# Patient Record
Sex: Female | Born: 1950 | Race: White | Hispanic: No | Marital: Married | Smoking: Never smoker
Health system: Southern US, Community
[De-identification: ages and names within clinical notes are randomized; demographics above are authoritative.]

## PROBLEM LIST (undated history)

## (undated) DIAGNOSIS — R112 Nausea with vomiting, unspecified: Secondary | ICD-10-CM

## (undated) DIAGNOSIS — K5730 Diverticulosis of large intestine without perforation or abscess without bleeding: Secondary | ICD-10-CM

## (undated) DIAGNOSIS — K5792 Diverticulitis of intestine, part unspecified, without perforation or abscess without bleeding: Secondary | ICD-10-CM

## (undated) DIAGNOSIS — Z860100 Personal history of colon polyps, unspecified: Secondary | ICD-10-CM

## (undated) DIAGNOSIS — F419 Anxiety disorder, unspecified: Secondary | ICD-10-CM

## (undated) DIAGNOSIS — Z9889 Other specified postprocedural states: Secondary | ICD-10-CM

## (undated) DIAGNOSIS — R42 Dizziness and giddiness: Secondary | ICD-10-CM

## (undated) DIAGNOSIS — K648 Other hemorrhoids: Secondary | ICD-10-CM

## (undated) DIAGNOSIS — E785 Hyperlipidemia, unspecified: Secondary | ICD-10-CM

## (undated) DIAGNOSIS — K759 Inflammatory liver disease, unspecified: Secondary | ICD-10-CM

## (undated) DIAGNOSIS — I10 Essential (primary) hypertension: Secondary | ICD-10-CM

## (undated) DIAGNOSIS — M1990 Unspecified osteoarthritis, unspecified site: Secondary | ICD-10-CM

## (undated) DIAGNOSIS — K219 Gastro-esophageal reflux disease without esophagitis: Secondary | ICD-10-CM

## (undated) HISTORY — DX: Hyperlipidemia, unspecified: E78.5

## (undated) HISTORY — DX: Personal history of colonic polyps: Z86.010

## (undated) HISTORY — DX: Diverticulitis of intestine, part unspecified, without perforation or abscess without bleeding: K57.92

## (undated) HISTORY — DX: Gastro-esophageal reflux disease without esophagitis: K21.9

## (undated) HISTORY — DX: Other hemorrhoids: K64.8

## (undated) HISTORY — PX: ELBOW FRACTURE SURGERY: SHX616

## (undated) HISTORY — DX: Essential (primary) hypertension: I10

## (undated) HISTORY — PX: TUBAL LIGATION: SHX77

## (undated) HISTORY — PX: HERNIA REPAIR: SHX51

## (undated) HISTORY — DX: Dizziness and giddiness: R42

## (undated) HISTORY — DX: Personal history of colon polyps, unspecified: Z86.0100

## (undated) HISTORY — PX: BREAST BIOPSY: SHX20

## (undated) HISTORY — DX: Diverticulosis of large intestine without perforation or abscess without bleeding: K57.30

## (undated) HISTORY — PX: HIP SURGERY: SHX245

---

## 2002-11-29 ENCOUNTER — Other Ambulatory Visit: Admission: RE | Admit: 2002-11-29 | Discharge: 2002-11-29 | Payer: Self-pay | Admitting: Obstetrics and Gynecology

## 2003-12-05 ENCOUNTER — Other Ambulatory Visit: Admission: RE | Admit: 2003-12-05 | Discharge: 2003-12-05 | Payer: Self-pay | Admitting: Obstetrics and Gynecology

## 2004-10-06 LAB — HM COLONOSCOPY

## 2004-10-13 ENCOUNTER — Ambulatory Visit: Payer: Self-pay | Admitting: Internal Medicine

## 2004-11-04 ENCOUNTER — Ambulatory Visit: Payer: Self-pay | Admitting: Internal Medicine

## 2004-11-04 ENCOUNTER — Encounter: Payer: Self-pay | Admitting: Internal Medicine

## 2004-11-04 DIAGNOSIS — K5730 Diverticulosis of large intestine without perforation or abscess without bleeding: Secondary | ICD-10-CM | POA: Insufficient documentation

## 2004-11-04 HISTORY — DX: Diverticulosis of large intestine without perforation or abscess without bleeding: K57.30

## 2004-12-10 ENCOUNTER — Other Ambulatory Visit: Admission: RE | Admit: 2004-12-10 | Discharge: 2004-12-10 | Payer: Self-pay | Admitting: Obstetrics and Gynecology

## 2004-12-20 ENCOUNTER — Ambulatory Visit (HOSPITAL_COMMUNITY): Admission: RE | Admit: 2004-12-20 | Discharge: 2004-12-20 | Payer: Self-pay | Admitting: General Surgery

## 2004-12-20 ENCOUNTER — Ambulatory Visit (HOSPITAL_BASED_OUTPATIENT_CLINIC_OR_DEPARTMENT_OTHER): Admission: RE | Admit: 2004-12-20 | Discharge: 2004-12-20 | Payer: Self-pay | Admitting: General Surgery

## 2004-12-20 ENCOUNTER — Encounter: Payer: Self-pay | Admitting: Internal Medicine

## 2004-12-20 ENCOUNTER — Encounter (INDEPENDENT_AMBULATORY_CARE_PROVIDER_SITE_OTHER): Payer: Self-pay | Admitting: Specialist

## 2005-01-11 ENCOUNTER — Ambulatory Visit (HOSPITAL_COMMUNITY): Admission: RE | Admit: 2005-01-11 | Discharge: 2005-01-11 | Payer: Self-pay | Admitting: Obstetrics and Gynecology

## 2005-03-31 ENCOUNTER — Ambulatory Visit: Payer: Self-pay | Admitting: Internal Medicine

## 2005-03-31 ENCOUNTER — Encounter: Payer: Self-pay | Admitting: Internal Medicine

## 2005-04-08 ENCOUNTER — Encounter: Payer: Self-pay | Admitting: Internal Medicine

## 2005-04-18 ENCOUNTER — Ambulatory Visit: Payer: Self-pay | Admitting: Internal Medicine

## 2005-05-12 ENCOUNTER — Inpatient Hospital Stay (HOSPITAL_COMMUNITY): Admission: EM | Admit: 2005-05-12 | Discharge: 2005-05-13 | Payer: Self-pay | Admitting: Emergency Medicine

## 2005-05-12 ENCOUNTER — Ambulatory Visit: Payer: Self-pay | Admitting: Gastroenterology

## 2005-05-12 IMAGING — CT CT PELVIS W/ CM
1 of 3 series · 14 of 32 positions shown, 19 images · IV contrast (ORAL OMNI 350 & 100 ML OMNI 300)
Comparison: None.

CLINICAL DATA: Rectal bleeding. 
 ABDOMEN CT WITH CONTRAST:
TECHNIQUE: Multidetector CT imaging of the abdomen was performed following the standard protocol during bolus administration of intravenous contrast.
 Contrast:  100 cc Omnipaque 300.
TECHNIQUE: Multidetector CT imaging of the pelvis was performed following the standard protocol during bolus administration of intravenous contrast.
 No pelvic lymphadenopathy.  
 No pelvic free fluid.  
 The uterus and adnexal structures are unremarkable. The visualized pelvic bowel loops, including the sigmoid colon and rectum appear normal.  
 No inguinal lymphadenopathy.

[Series 2: routine abdomen · axial · 0.74mm/px · z∈[-450,-25]mm · 14 of 94 slices shown, 19 images]
[im 5/94  soft-tissue]
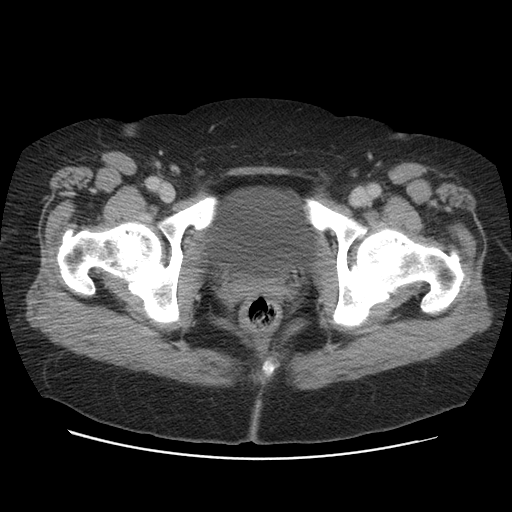
[im 5/94  bone]
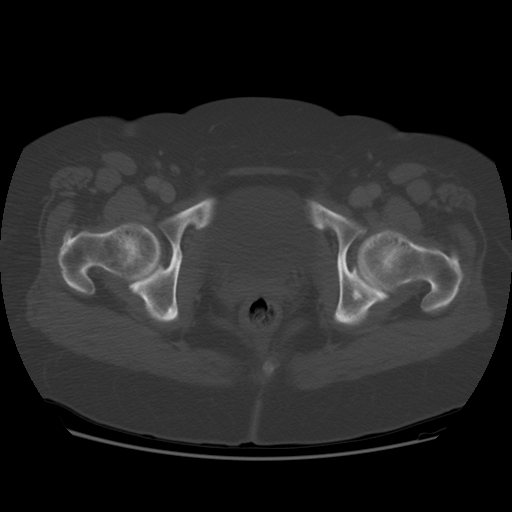
[im 14/94  soft-tissue]
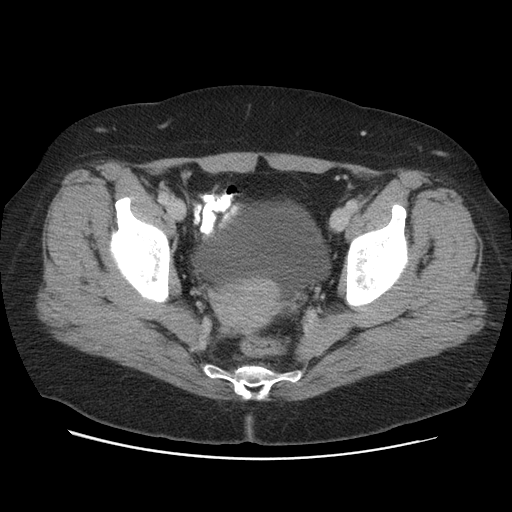
[im 19/94  soft-tissue]
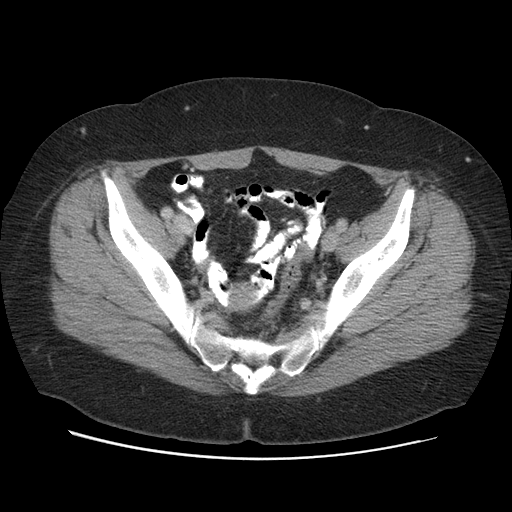
[im 28/94  soft-tissue]
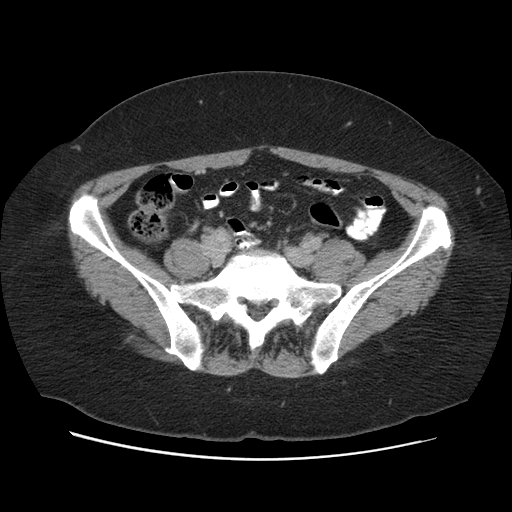
[im 33/94  soft-tissue]
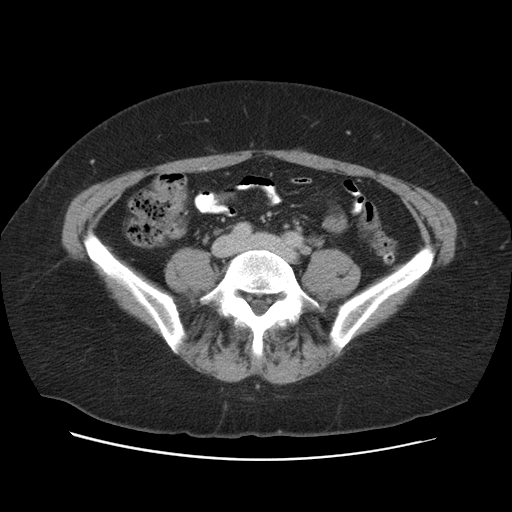
[im 42/94  soft-tissue]
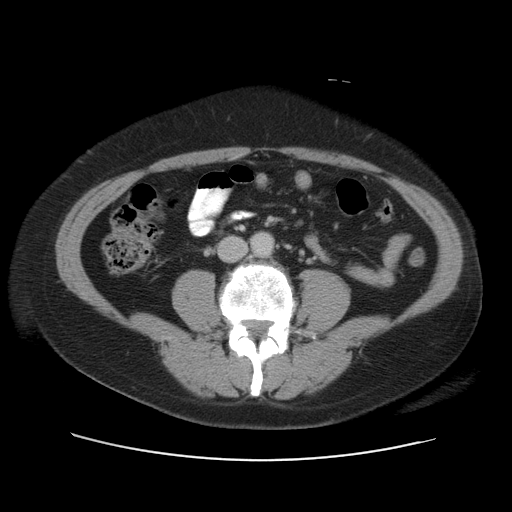
[im 47/94  soft-tissue]
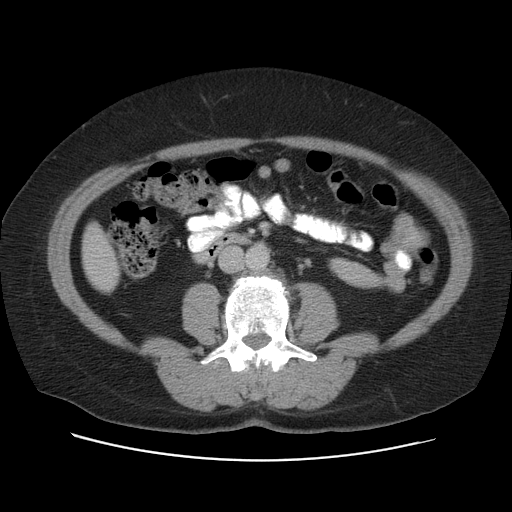
[im 52/94  soft-tissue]
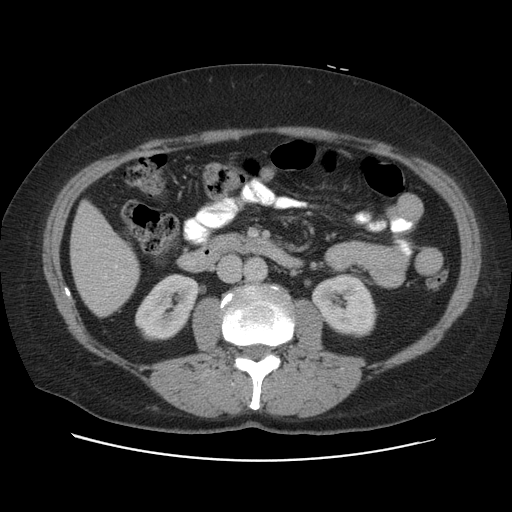
[im 61/94  soft-tissue]
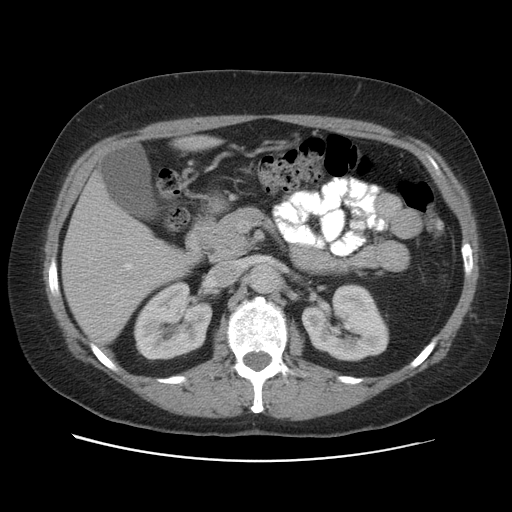
[im 61/94  bone]
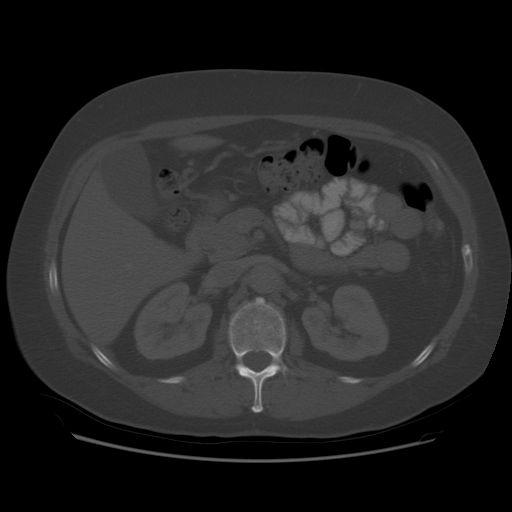
[im 66/94  soft-tissue]
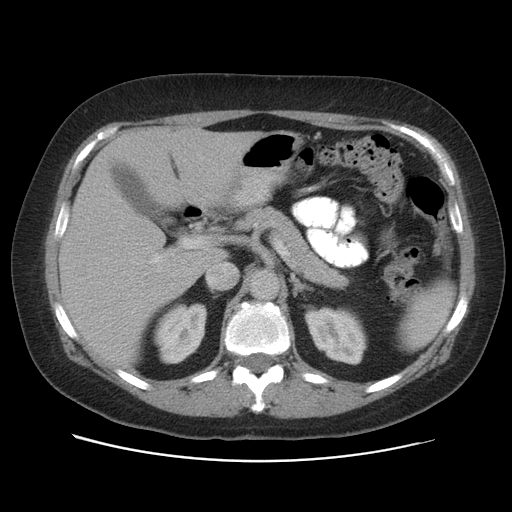
[im 75/94  soft-tissue]
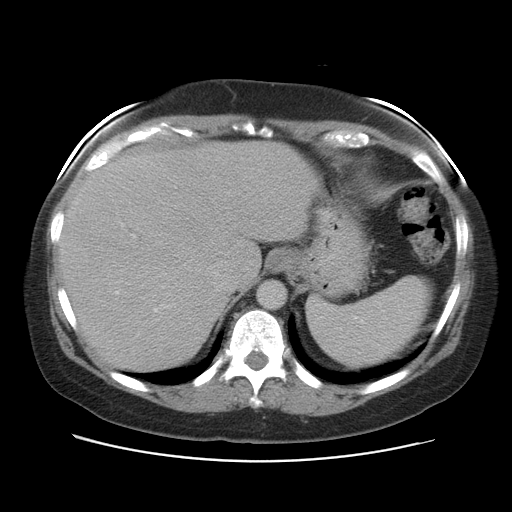
[im 75/94  lung]
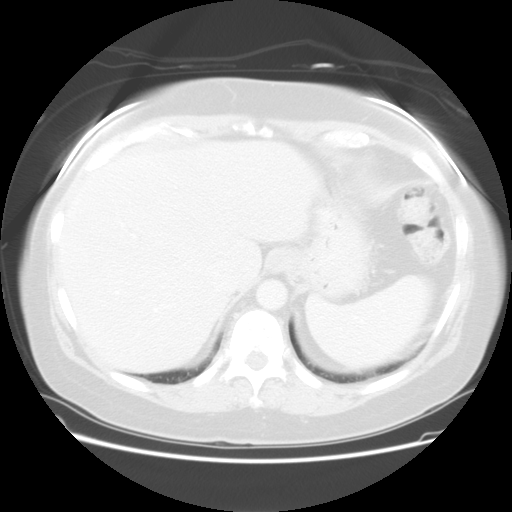
[im 80/94  soft-tissue]
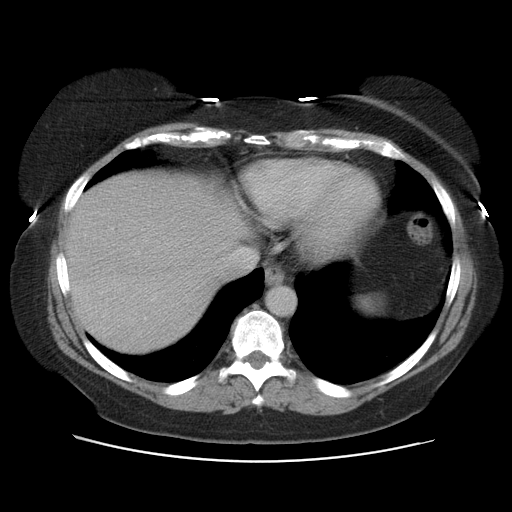
[im 80/94  lung]
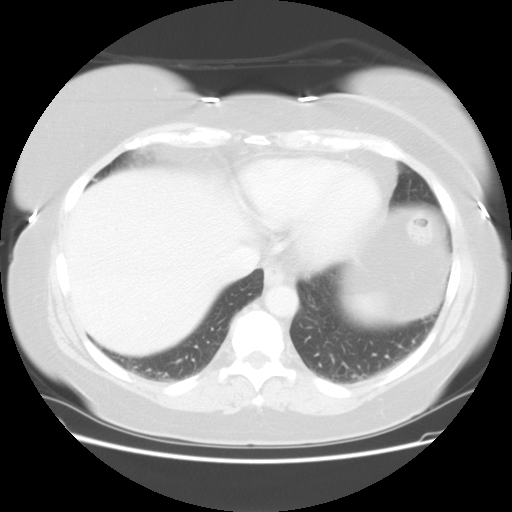
[im 84/94  lung]
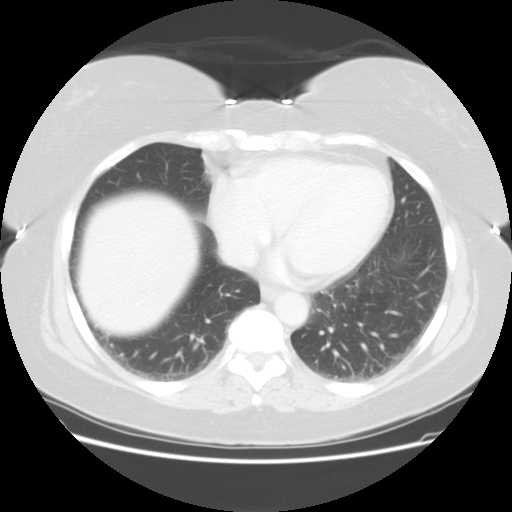
[im 89/94  soft-tissue]
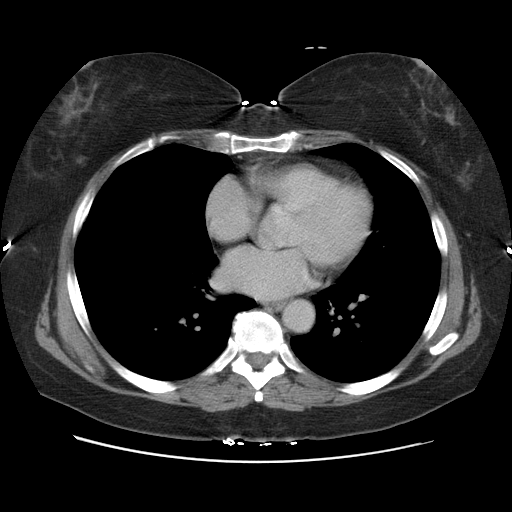
[im 89/94  lung]
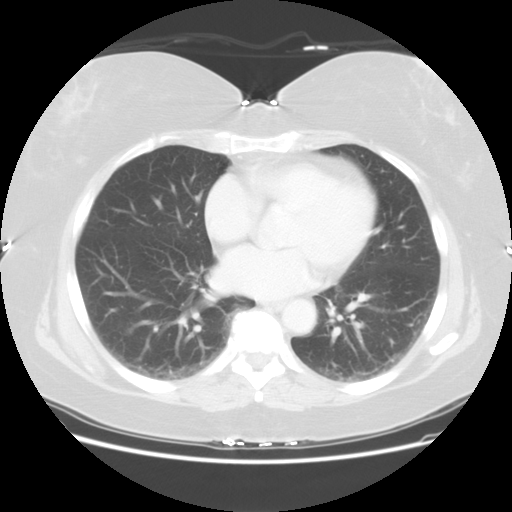

[14 of 32 positions shown; findings below may reference images not displayed]

The visualized lung base is clear. 
 No pleural or pericardial effusion. 
 The liver is normal in attenuation and morphology without evidence for intrahepatic biliary ductal dilatation. The gallbladder is negative. 
 The spleen is negative. The pancreas is negative. Adrenal glands are negative. 
 Right kidney is negative.   Left kidney is negative. 
 Negative for retroperitoneal or mesenteric lymphadenopathy. 
 The appendix is negative.
IMPRESSION: 1.  No acute findings.  
 2.  Negative appendix. 
 PELVIS CT WITH CONTRAST:
IMPRESSION: No acute pelvic CT findings.

## 2005-09-27 ENCOUNTER — Ambulatory Visit: Payer: Self-pay | Admitting: Internal Medicine

## 2005-10-04 ENCOUNTER — Ambulatory Visit: Payer: Self-pay | Admitting: Internal Medicine

## 2005-10-12 ENCOUNTER — Encounter: Payer: Self-pay | Admitting: Internal Medicine

## 2005-10-12 ENCOUNTER — Ambulatory Visit: Payer: Self-pay

## 2006-10-03 ENCOUNTER — Ambulatory Visit: Payer: Self-pay | Admitting: Internal Medicine

## 2006-10-03 LAB — CONVERTED CEMR LAB
ALT: 18 units/L (ref 0–40)
AST: 28 units/L (ref 0–37)
Albumin: 4.1 g/dL (ref 3.5–5.2)
Alkaline Phosphatase: 70 units/L (ref 39–117)
BUN: 9 mg/dL (ref 6–23)
Basophils Absolute: 0 10*3/uL (ref 0.0–0.1)
Basophils Relative: 0.6 % (ref 0.0–1.0)
Bilirubin, Direct: 0.1 mg/dL (ref 0.0–0.3)
CO2: 33 meq/L — ABNORMAL HIGH (ref 19–32)
Calcium: 10.1 mg/dL (ref 8.4–10.5)
Chloride: 106 meq/L (ref 96–112)
Cholesterol: 209 mg/dL (ref 0–200)
Creatinine, Ser: 0.9 mg/dL (ref 0.4–1.2)
Direct LDL: 129.7 mg/dL
Eosinophils Absolute: 0.1 10*3/uL (ref 0.0–0.6)
Eosinophils Relative: 2.2 % (ref 0.0–5.0)
GFR calc Af Amer: 83 mL/min
GFR calc non Af Amer: 69 mL/min
Glucose, Bld: 115 mg/dL — ABNORMAL HIGH (ref 70–99)
HCT: 44 % (ref 36.0–46.0)
HDL: 47.8 mg/dL (ref >39.0)
Hemoglobin: 14.8 g/dL (ref 12.0–15.0)
Hgb A1c MFr Bld: 6 % (ref 4.6–6.0)
Lymphocytes Relative: 49.1 % — ABNORMAL HIGH (ref 12.0–46.0)
MCHC: 33.5 g/dL (ref 30.0–36.0)
MCV: 94.3 fL (ref 78.0–100.0)
Monocytes Absolute: 0.5 10*3/uL (ref 0.2–0.7)
Monocytes Relative: 7 % (ref 3.0–11.0)
Neutro Abs: 2.8 10*3/uL (ref 1.4–7.7)
Neutrophils Relative %: 41.1 % — ABNORMAL LOW (ref 43.0–77.0)
Platelets: 272 10*3/uL (ref 150–400)
Potassium: 4.6 meq/L (ref 3.5–5.1)
RBC: 4.67 M/uL (ref 3.87–5.11)
RDW: 12.1 % (ref 11.5–14.6)
Sodium: 147 meq/L — ABNORMAL HIGH (ref 135–145)
TSH: 1.11 microintl units/mL (ref 0.35–5.50)
Total Bilirubin: 0.7 mg/dL (ref 0.3–1.2)
Total CHOL/HDL Ratio: 4.4
Total Protein: 7.1 g/dL (ref 6.0–8.3)
Triglycerides: 154 mg/dL — ABNORMAL HIGH (ref 0–149)
VLDL: 31 mg/dL (ref 0–40)
WBC: 6.8 10*3/uL (ref 4.5–10.5)

## 2006-10-09 ENCOUNTER — Ambulatory Visit: Payer: Self-pay | Admitting: Internal Medicine

## 2006-10-31 ENCOUNTER — Ambulatory Visit: Payer: Self-pay | Admitting: Internal Medicine

## 2006-11-01 LAB — CONVERTED CEMR LAB
Basophils Absolute: 0.3 10*3/uL — ABNORMAL HIGH (ref 0.0–0.1)
Cholesterol: 171 mg/dL (ref 0–200)
H Pylori IgG: NEGATIVE
HDL: 33.5 mg/dL — ABNORMAL LOW (ref >39.0)
Hemoglobin: 14.4 g/dL (ref 12.0–15.0)
LDL Cholesterol: 100 mg/dL — ABNORMAL HIGH (ref 0–99)
MCHC: 35 g/dL (ref 30.0–36.0)
Monocytes Absolute: 0.5 10*3/uL (ref 0.2–0.7)
Monocytes Relative: 6.2 % (ref 3.0–11.0)
Neutro Abs: 3 10*3/uL (ref 1.4–7.7)
RDW: 11.8 % (ref 11.5–14.6)
Triglycerides: 186 mg/dL — ABNORMAL HIGH (ref 0–149)

## 2006-12-07 LAB — CONVERTED CEMR LAB: Pap Smear: NORMAL

## 2007-02-28 ENCOUNTER — Ambulatory Visit: Payer: Self-pay | Admitting: Internal Medicine

## 2007-06-28 ENCOUNTER — Ambulatory Visit: Payer: Self-pay | Admitting: Internal Medicine

## 2007-09-19 ENCOUNTER — Ambulatory Visit: Payer: Self-pay | Admitting: Internal Medicine

## 2007-09-19 LAB — CONVERTED CEMR LAB
AST: 27 units/L (ref 0–37)
Bilirubin, Direct: 0.2 mg/dL (ref 0.0–0.3)
Blood in Urine, dipstick: NEGATIVE
Chloride: 105 meq/L (ref 96–112)
Direct LDL: 128.9 mg/dL
Eosinophils Absolute: 0.1 10*3/uL (ref 0.0–0.6)
Eosinophils Relative: 1.9 % (ref 0.0–5.0)
GFR calc Af Amer: 73 mL/min
GFR calc non Af Amer: 61 mL/min
Glucose, Bld: 100 mg/dL — ABNORMAL HIGH (ref 70–99)
HCT: 42.6 % (ref 36.0–46.0)
Lymphocytes Relative: 46.7 % — ABNORMAL HIGH (ref 12.0–46.0)
MCV: 95 fL (ref 78.0–100.0)
Neutro Abs: 2.9 10*3/uL (ref 1.4–7.7)
Neutrophils Relative %: 43.7 % (ref 43.0–77.0)
Protein, U semiquant: NEGATIVE
Sodium: 141 meq/L (ref 135–145)
Total Protein: 6.7 g/dL (ref 6.0–8.3)
WBC Urine, dipstick: NEGATIVE
WBC: 6.6 10*3/uL (ref 4.5–10.5)

## 2007-10-11 ENCOUNTER — Ambulatory Visit: Payer: Self-pay | Admitting: Internal Medicine

## 2007-10-11 DIAGNOSIS — I10 Essential (primary) hypertension: Secondary | ICD-10-CM | POA: Insufficient documentation

## 2007-10-11 DIAGNOSIS — Z860100 Personal history of colon polyps, unspecified: Secondary | ICD-10-CM | POA: Insufficient documentation

## 2007-10-11 DIAGNOSIS — K219 Gastro-esophageal reflux disease without esophagitis: Secondary | ICD-10-CM | POA: Insufficient documentation

## 2007-10-11 DIAGNOSIS — E785 Hyperlipidemia, unspecified: Secondary | ICD-10-CM | POA: Insufficient documentation

## 2007-12-27 ENCOUNTER — Telehealth: Payer: Self-pay | Admitting: Internal Medicine

## 2007-12-27 ENCOUNTER — Ambulatory Visit: Payer: Self-pay | Admitting: Internal Medicine

## 2008-03-07 ENCOUNTER — Encounter: Payer: Self-pay | Admitting: Internal Medicine

## 2008-04-04 ENCOUNTER — Encounter: Payer: Self-pay | Admitting: Internal Medicine

## 2008-04-17 ENCOUNTER — Telehealth (INDEPENDENT_AMBULATORY_CARE_PROVIDER_SITE_OTHER): Payer: Self-pay | Admitting: *Deleted

## 2008-04-17 ENCOUNTER — Encounter: Payer: Self-pay | Admitting: Internal Medicine

## 2008-05-12 ENCOUNTER — Telehealth: Payer: Self-pay | Admitting: Internal Medicine

## 2008-11-13 ENCOUNTER — Telehealth: Payer: Self-pay | Admitting: Internal Medicine

## 2008-11-18 ENCOUNTER — Encounter: Payer: Self-pay | Admitting: Internal Medicine

## 2008-12-11 ENCOUNTER — Ambulatory Visit: Payer: Self-pay | Admitting: Internal Medicine

## 2008-12-11 LAB — CONVERTED CEMR LAB
ALT: 20 units/L (ref 0–35)
AST: 26 units/L (ref 0–37)
Basophils Relative: 0.7 % (ref 0.0–3.0)
Chloride: 104 meq/L (ref 96–112)
Direct LDL: 140.8 mg/dL
Eosinophils Relative: 1.4 % (ref 0.0–5.0)
GFR calc non Af Amer: 67.92 mL/min (ref >60)
Glucose, Urine, Semiquant: NEGATIVE
HCT: 43.8 % (ref 36.0–46.0)
Hemoglobin: 14.9 g/dL (ref 12.0–15.0)
Lymphs Abs: 3.3 10*3/uL (ref 0.7–4.0)
MCV: 96.3 fL (ref 78.0–100.0)
Monocytes Absolute: 0.5 10*3/uL (ref 0.1–1.0)
Monocytes Relative: 7 % (ref 3.0–12.0)
Nitrite: NEGATIVE
Potassium: 3.7 meq/L (ref 3.5–5.1)
RBC: 4.55 M/uL (ref 3.87–5.11)
Sodium: 143 meq/L (ref 135–145)
TSH: 0.93 microintl units/mL (ref 0.35–5.50)
Total CHOL/HDL Ratio: 4
Total Protein: 7.3 g/dL (ref 6.0–8.3)
VLDL: 21.6 mg/dL (ref 0.0–40.0)
WBC: 6.5 10*3/uL (ref 4.5–10.5)
pH: 7

## 2008-12-22 ENCOUNTER — Ambulatory Visit: Payer: Self-pay | Admitting: Internal Medicine

## 2008-12-23 ENCOUNTER — Encounter: Payer: Self-pay | Admitting: Internal Medicine

## 2008-12-23 ENCOUNTER — Telehealth: Payer: Self-pay | Admitting: Internal Medicine

## 2009-03-05 ENCOUNTER — Encounter: Payer: Self-pay | Admitting: Internal Medicine

## 2009-03-16 ENCOUNTER — Telehealth: Payer: Self-pay | Admitting: Internal Medicine

## 2009-04-01 ENCOUNTER — Encounter: Payer: Self-pay | Admitting: Internal Medicine

## 2009-04-01 ENCOUNTER — Ambulatory Visit: Payer: Self-pay | Admitting: Internal Medicine

## 2009-04-23 ENCOUNTER — Telehealth: Payer: Self-pay | Admitting: Internal Medicine

## 2009-05-19 ENCOUNTER — Ambulatory Visit: Payer: Self-pay | Admitting: Internal Medicine

## 2009-05-20 ENCOUNTER — Telehealth: Payer: Self-pay | Admitting: Internal Medicine

## 2009-10-20 ENCOUNTER — Telehealth: Payer: Self-pay | Admitting: Internal Medicine

## 2009-12-28 ENCOUNTER — Telehealth: Payer: Self-pay | Admitting: Internal Medicine

## 2010-01-19 ENCOUNTER — Encounter: Payer: Self-pay | Admitting: Internal Medicine

## 2010-03-09 ENCOUNTER — Telehealth: Payer: Self-pay | Admitting: Internal Medicine

## 2010-03-23 ENCOUNTER — Ambulatory Visit: Payer: Self-pay | Admitting: Internal Medicine

## 2010-03-23 LAB — CONVERTED CEMR LAB
ALT: 27 units/L (ref 0–35)
AST: 28 units/L (ref 0–37)
Alkaline Phosphatase: 63 units/L (ref 39–117)
BUN: 13 mg/dL (ref 6–23)
Basophils Relative: 0.6 % (ref 0.0–3.0)
Bilirubin Urine: NEGATIVE
Bilirubin, Direct: 0.1 mg/dL (ref 0.0–0.3)
Blood in Urine, dipstick: NEGATIVE
Chloride: 102 meq/L (ref 96–112)
Cholesterol: 205 mg/dL — ABNORMAL HIGH (ref 0–200)
Creatinine, Ser: 0.7 mg/dL (ref 0.4–1.2)
Direct LDL: 131.1 mg/dL
Eosinophils Relative: 1.4 % (ref 0.0–5.0)
GFR calc non Af Amer: 86.11 mL/min (ref >60)
Glucose, Urine, Semiquant: NEGATIVE
HDL: 46.4 mg/dL (ref >39.00)
Lymphocytes Relative: 31.7 % (ref 12.0–46.0)
MCV: 96.1 fL (ref 78.0–100.0)
Monocytes Relative: 6.5 % (ref 3.0–12.0)
Neutrophils Relative %: 59.8 % (ref 43.0–77.0)
Platelets: 250 10*3/uL (ref 150.0–400.0)
Protein, U semiquant: NEGATIVE
RBC: 4.16 M/uL (ref 3.87–5.11)
Total Bilirubin: 0.6 mg/dL (ref 0.3–1.2)
Total CHOL/HDL Ratio: 4
Total Protein: 6.6 g/dL (ref 6.0–8.3)
Urobilinogen, UA: 0.2
VLDL: 32 mg/dL (ref 0.0–40.0)
WBC: 10.3 10*3/uL (ref 4.5–10.5)
pH: 6.5

## 2010-03-30 ENCOUNTER — Ambulatory Visit: Payer: Self-pay | Admitting: Internal Medicine

## 2010-03-30 LAB — HM MAMMOGRAPHY

## 2010-04-01 ENCOUNTER — Telehealth: Payer: Self-pay | Admitting: Internal Medicine

## 2010-04-01 ENCOUNTER — Ambulatory Visit: Payer: Self-pay | Admitting: Family Medicine

## 2010-04-09 ENCOUNTER — Encounter: Payer: Self-pay | Admitting: Internal Medicine

## 2010-05-26 ENCOUNTER — Ambulatory Visit: Payer: Self-pay | Admitting: Family Medicine

## 2010-05-26 ENCOUNTER — Ambulatory Visit: Payer: Self-pay | Admitting: Internal Medicine

## 2010-05-26 DIAGNOSIS — R112 Nausea with vomiting, unspecified: Secondary | ICD-10-CM | POA: Insufficient documentation

## 2010-06-14 ENCOUNTER — Encounter: Payer: Self-pay | Admitting: Internal Medicine

## 2010-09-07 NOTE — Letter (Signed)
Summary: [facility]   Imported By: Sherian Rein 05/27/2010 13:16:45  _____________________________________________________________________  External Attachment:    Type:   Image     Comment:   External Document

## 2010-09-07 NOTE — Progress Notes (Signed)
Summary: Re: Colonoscopy   Phone Note Call from Patient Call back at Work Phone [phone]   Caller: Patient Call For: Dr. [name] Reason for Call: Talk to Nurse Summary of Call: pt is due for recall colon in 2016...said Dr. Christella Hartigan saw her in hosp. in 2006 and was told to f/u in 5 yrs. Wants to know if she can get colon sch'd now Initial call taken by: Karna Christmas,  Dec 28, 2009 3:32 PM  Follow-up for Phone Call        Followed by Dr.Hoxworth and last visit was 04/01/09 and he said there were no symptoms to suggest recurrent or progressive disease and she is due for f/u with him in August of this year. Follow-up by: Teryl Lucy RN,  Dec 29, 2009 9:56 AM  Additional Follow-up for Phone Call Additional follow up Details #1::        as long as she is not having bowel symptoms or other relevant clinical issues, her followup colonoscopy would not be due until 2016. 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 Johna Sheriff is appropriate Additional Follow-up by: Hilarie Fredrickson MD,  Dec 29, 2009 10:49 AM    Additional Follow-up for Phone Call Additional follow up Details #2::    Message left fot pt. to call back   Teryl Lucy RN  Dec 29, 2009 11:12 AM  Pt. informed that colon will be due in 2016. Not having any GI issues. Follow-up by: Teryl Lucy RN,  Jan 05, 2010 11:58 AM

## 2010-09-07 NOTE — Assessment & Plan Note (Signed)
Summary: SINUSITIS? // RS   Vital Signs:  Patient profile:   60 year old female Menstrual status:  postmenopausal Weight:      223 pounds Temp:     98.5 degrees F oral BP sitting:   120 / 84  (left arm) Cuff size:   regular  Vitals Entered By: Sid Falcon LPN (May 26, 2010 4:02 PM)  History of Present Illness: Patient seen with over one week history of sinus congestion. Progressive right maxillary facial pain. Discolored mucus. Occasional cough. Right ear pressure and pain. Concerned with upcoming plane flight this weekend. No fever  Allergies: 1)  ! 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   Ears:  right ear canal impacted with cerumen. Left ear drum normal Nose:  External nasal examination shows no deformity or inflammation. Nasal mucosa are pink and moist without lesions or exudates. Mouth:  Oral mucosa and oropharynx without lesions or exudates.  Teeth in good repair. Neck:  No deformities, masses, or tenderness noted. Lungs:  Normal respiratory effort, chest expands symmetrically. Lungs are clear to auscultation, no crackles or wheezes. Heart:  normal rate and regular rhythm.     Impression & Recommendations:  Problem # 1:  CERUMEN IMPACTION (ICD-380.4) removed with irrigation.  Problem # 2:  SINUSITIS, ACUTE (ICD-461.9)  Her updated medication list for this problem  includes:    Allegra-d 12 Hour 60-120 Mg Xr12h-tab (Fexofenadine-pseudoephedrine) ..... One by mouth once daily for allergies as needed    Azithromycin 250 Mg Tabs (Azithromycin) .Marland Kitchen... 2 by mouth today then one by mouth once daily for 4 days  Complete Medication List: 1)  Hydrochlorothiazide 25 Mg Tabs (Hydrochlorothiazide) .... Take 1 tablet by mouth every morning 2)  Nexium 40 Mg Cpdr (Esomeprazole magnesium) .... Take 1 capsule by mouth once a day 3)  Valtrex 500 Mg Tabs (Valacyclovir hcl) .... Take 1 tablet by mouth once a day 4)  Aspirin 81 Mg Tbec (Aspirin) .... Once daily 5)  Coq-10 150 Mg Caps (Coenzyme q10) .... Once daily 6)  Metamucil Plus Calcium Caps (Psyllium-calcium) .... As needed 7)  Allegra-d 12 Hour 60-120 Mg Xr12h-tab (Fexofenadine-pseudoephedrine) .... One by mouth once daily for allergies as needed 8)  Celebrex 200 Mg Caps (Celecoxib) .... Take 1 tablet by mouth once a day as needed 9)  Glucosamine-chondroitin Caps (Glucosamine-chondroit-vit c-mn) .... One capsule by mouth once daily 10)  Steroid Injection  .... Injection into back for back pain 11)  Azithromycin 250 Mg Tabs (Azithromycin) .... 2 by mouth today then one by mouth once daily for 4 days  Patient Instructions: 1)  Acute sinusitis symptoms for less than 10 days are not helped by antibiotics. Use warm moist compresses, and over the counter decongestants( only as directed). Call if no improvement in 5-7 days, sooner if increasing pain, fever, or new symptoms.  Prescriptions: AZITHROMYCIN 250 MG TABS (AZITHROMYCIN) 2 by mouth today then one by mouth once daily for 4 days  #6 x 0   Entered and Authorized by:   Evelena Peat MD  Signed by:   Evelena Peat MD on 05/26/2010   Method used:   Print then Give to Patient   RxID:   [id number]    Orders Added: 1)  Est. Patient Level III [56213]

## 2010-09-07 NOTE — Progress Notes (Signed)
Summary: REFILL  Phone Note Refill Request Message from:  Fax from Pharmacy  Refills Requested: Medication #1:  HYDROCHLOROTHIAZIDE 25 MG TABS Take 1 tablet by mouth every morning [facility]---[phone]      FAX---[phone]  Initial call taken by: Warnell Forester,  October 20, 2009 8:56 AM    Prescriptions: HYDROCHLOROTHIAZIDE 25 MG TABS (HYDROCHLOROTHIAZIDE) Take 1 tablet by mouth every morning  #100 Tablet x 0   Entered by:   Willy Eddy, LPN   Authorized by:   Birdie Sons MD   Signed by:   Willy Eddy, LPN on 16/05/9603   Method used:   Electronically to        [facility] (retail)       [address]       [place], [place]  [address]       Ph: [phone]       Fax: [phone]   RxID:   [id number]

## 2010-09-07 NOTE — Consult Note (Signed)
Summary[facility]   Imported By: Sherian Rein 05/27/2010 13:15:47  _____________________________________________________________________  External Attachment:    Type:   Image     Comment:   External Document

## 2010-09-07 NOTE — Miscellaneous (Signed)
Summary: Waiver of Liability for Zostavax  Waiver of Liability for Zostavax   Imported By: Maryln Gottron 04/01/2010 08:40:16  _____________________________________________________________________  External Attachment:    Type:   Image     Comment:   External Document

## 2010-09-07 NOTE — Assessment & Plan Note (Signed)
Summary: left arm swollen pt had vaccine yesterday/njr   Vital Signs:  Patient profile:   60 year old female Menstrual status:  postmenopausal Temp:     98.6 degrees F oral BP sitting:   110 / 70  (left arm) Cuff size:   large  Vitals Entered By: Sid Falcon LPN (April 01, 2010 4:55 PM)  History of Present Illness: Same-day appointment.  patient had shingles vaccine 2 days ago. The day following vaccine noticed some redness, swelling, itching and mild pain at injection site. Slightly increased swelling today. She took one Benadryl. Used ice on a couple of occasions. Denies any systemic fever. No history of allergy to gelatin or neomycin. No other systemic rash.  Allergies (verified): 1)  ! 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    Physical Exam  General:  Well-developed,well-nourished,in no acute distress; alert,appropriate and cooperative throughout examination Lungs:  Normal respiratory effort, chest expands symmetrically. Lungs are clear to auscultation, no crackles or wheezes. Heart:  normal rate and regular rhythm.   Skin:  left posterior arm at injection site she has area of faint erythema 6 x 10 cm. New the center there is a zone about 2 x 2 cm minimally indurated and slightly darker erythema. No fluctuance. Minimally tender to palpation   Impression & Recommendations:  Problem # 1:  ABNORMAL REACTION/COMPLICAT D/T OTH SPEC PROCED (ICD-E879.8) local reaction to shingles vaccine. Continue icing and take antihistamines such as Allegra once daily. Should resolve over the next few days  Complete Medication List: 1)  Hydrochlorothiazide 25 Mg Tabs (Hydrochlorothiazide) .... Take 1 tablet by mouth every morning 2)  Nexium 40 Mg Cpdr (Esomeprazole magnesium) ....  Take 1 capsule by mouth once a day 3)  Valtrex 500 Mg Tabs (Valacyclovir hcl) .... Take 1 tablet by mouth once a day 4)  Aspirin 81 Mg Tbec (Aspirin) .... Once daily 5)  Coq-10 150 Mg Caps (Coenzyme q10) .... Once daily 6)  Glucosamine-chondroitin 1500-1200 Mg/46ml Liqd (Glucosamine-chondroitin) .... 2 once daily--takes occ 7)  Metamucil Plus Calcium Caps (Psyllium-calcium) .... 3 once daily 8)  Allegra-d 12 Hour 60-120 Mg Xr12h-tab (Fexofenadine-pseudoephedrine) .... One by mouth once daily for allergies as needed 9)  Celebrex 200 Mg Caps (Celecoxib) .... Take 1 tablet by mouth once a day as needed  Patient Instructions: 1)  Continue with ice to L arm several times daily. 2)  Consider OTC Allegra one daily for itching and swelling. 3)  Swelling and redness should decrease over the next few days.

## 2010-09-07 NOTE — Letter (Signed)
Summary: [facility]   Imported By: Sherian Rein 06/30/2010 13:53:02  _____________________________________________________________________  External Attachment:    Type:   Image     Comment:   External Document

## 2010-09-07 NOTE — Progress Notes (Signed)
Summary: reaction to Shingles injection  Phone Note Call from Patient Call back at Work Phone [phone]   Caller: Patient Call For: Birdie Sons MD Summary of Call: Pt is having reaction to Shingles injection......Marland Kitchenredness (size of apple), hot, and painful.  Pt is using ice and Tylenol. Initial call taken by: Lynann Beaver CMA,  April 01, 2010 10:57 AM  Follow-up for Phone Call        pt decide to see doc, pt is sch with dr Caryl Never today Follow-up by: Heron Sabins,  April 01, 2010 1:00 PM

## 2010-09-07 NOTE — Letter (Signed)
Summary: [facility]  [facility]   Imported By: Sherian Rein 04/16/2010 12:05:49  _____________________________________________________________________  External Attachment:    Type:   Image     Comment:   External Document

## 2010-09-07 NOTE — Assessment & Plan Note (Signed)
Summary: cpx//ccm   Vital Signs:  Patient profile:   60 year old female Menstrual status:  postmenopausal Height:      67 inches Weight:      217 pounds BMI:     34.11 Pulse rate:   64 / minute Pulse rhythm:   regular Resp:     12 per minute BP sitting:   110 / 76  (left arm) Cuff size:   regular  Vitals Entered By: Gladis Riffle, RN (March 30, 2010 9:04 AM)  Nutrition Counseling: Patient's BMI is greater than 25 and therefore counseled on weight management options. CC: cpx, labs done, has gyn--fell and injured back  so being treated for bulging disc Is Patient Diabetic? No     Menstrual Status postmenopausal Last PAP Result normal-pt's report   CC:  cpx, labs done, and has gyn--fell and injured back  so being treated for bulging disc.  History of Present Illness: cpx  had a fall at work---workman's comp---PT and ESI with some success  Preventive Screening-Counseling & Management  Alcohol-Tobacco     Smoking Status: never  Current Problems (verified): 1)  Diverticulosis, Colon  (ICD-562.10) 2)  Hyperlipidemia  (ICD-272.4) 3)  Hypertension  (ICD-401.9) 4)  Gerd  (ICD-530.81) 5)  Colonic Polyps, Hx of  (ICD-V12.72) 6)  Physical Examination  (ICD-V70.0)  Current Medications (verified): 1)  Hydrochlorothiazide 25 Mg Tabs (Hydrochlorothiazide) .... Take 1 Tablet By Mouth Every Morning 2)  Nexium 40 Mg Cpdr (Esomeprazole Magnesium) .... Take 1 Capsule By Mouth Once A Day 3)  Valtrex 500 Mg Tabs (Valacyclovir Hcl) .... Take 1 Tablet By Mouth Once A Day 4)  Aspirin 81 Mg  Tbec (Aspirin) .... Once Daily 5)  Coq-10 150 Mg  Caps (Coenzyme Q10) .... Once Daily 6)  Glucosamine-Chondroitin 1500-1200 Mg/74ml  Liqd (Glucosamine-Chondroitin) .... 2 Once Daily--Takes Occ 7)  Metamucil Plus Calcium   Caps (Psyllium-Calcium) .... 3 Once Daily 8)  Allegra-D 12 Hour 60-120 Mg Xr12h-Tab (Fexofenadine-Pseudoephedrine) .... One By Mouth Once Daily For Allergies As Needed 9)  Celebrex 200  Mg Caps (Celecoxib) .... Take 1 Tablet By Mouth Once A Day As Needed  Allergies (verified): No Known Drug Allergies  Past History:  Past Surgical History: Last updated: 12/22/2008 left arm fx--cast Tubal ligation, bilateral after laparoscopy blepharoplasty  Family History: Last updated: 03/30/2010 Family History Diabetes 1st degree relative--father  father deceased age 85--DM and heart disease, leukemia mother deceased 90--lung ca 4 brothers  some DM; one brother, status post MI 4 sisters  one alive with colon ca (2 with DM)  Social History: Last updated: 03/30/2010 Married Regular exercise-no Never Smoked Alcohol use-no no children Occupation:[employer]  Risk Factors: Exercise: no (06/28/2007)  Risk Factors: Smoking Status: never (03/30/2010)  Past Medical History:  Colonic polyps, 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   Head:  normocephalic and atraumatic.   Eyes:  pupils equal and pupils round.   Ears:  R ear normal and L ear normal.   Nose:  no external deformity and no external erythema.   Neck:  No deformities, masses, or tenderness noted. Lungs:  normal respiratory  effort and no intercostal retractions.   Heart:  normal rate and regular rhythm.   Abdomen:  soft and non-tender.   Msk:  normal ROM.   Neurologic:  cranial nerves II-XII intact and gait normal.   Skin:  turgor normal and color normal.     Impression & Recommendations:  Problem # 1:  PHYSICAL EXAMINATION (ICD-V70.0) health maint utd advised regualr  exercise  Problem # 2:  HYPERLIPIDEMIA (ICD-272.4) no rx regular exercise should help  Problem # 3:  HYPERTENSION (ICD-401.9)  Her updated medication list for this problem includes:    Hydrochlorothiazide 25 Mg Tabs (Hydrochlorothiazide) .Marland Kitchen... Take 1 tablet by mouth every morning  BP today: 110/76 Prior BP: 114/78 (05/19/2009)  Labs Reviewed: K+: 4.3 (03/23/2010) Creat: : 0.7 (03/23/2010)   Chol: 205 (03/23/2010)   HDL: 46.40 (03/23/2010)   LDL: DEL (09/19/2007)   TG: 160.0 (03/23/2010)  Complete Medication List: 1)  Hydrochlorothiazide 25 Mg Tabs (Hydrochlorothiazide) .... Take 1 tablet by mouth every morning 2)  Nexium 40 Mg Cpdr (Esomeprazole magnesium) .... Take 1 capsule by mouth once a day 3)  Valtrex 500 Mg Tabs (Valacyclovir hcl) .... Take 1 tablet by mouth once a day 4)  Aspirin 81 Mg Tbec (Aspirin) .... Once daily 5)  Coq-10 150 Mg Caps (Coenzyme q10) .... Once daily 6)  Glucosamine-chondroitin 1500-1200 Mg/34ml Liqd (Glucosamine-chondroitin) .... 2 once daily--takes occ 7)  Metamucil Plus Calcium Caps (Psyllium-calcium) .... 3 once daily 8)  Allegra-d 12 Hour 60-120 Mg Xr12h-tab (Fexofenadine-pseudoephedrine) .... One by mouth once daily for allergies as needed 9)  Celebrex 200 Mg Caps (Celecoxib) .... Take 1 tablet by mouth once a day as needed  Patient Instructions: 1)  Please schedule a follow-up appointment in 6 months. Prescriptions: ALLEGRA-D 12 HOUR 60-120 MG XR12H-TAB (FEXOFENADINE-PSEUDOEPHEDRINE) One by mouth once daily for allergies as needed  #30 Tablet x 5   Entered and Authorized by:   Birdie Sons MD   Signed by:   Birdie Sons MD on 03/30/2010   Method used:   [facility]ron[facility]        [facility]       [address]       [place], [place]  [address]       Ph: [phone]       Fax: [phone]   RxID:   [id number] ALLEGRA-D 12 HOUR 60-120 MG XR12H-TAB (FEXOFENADINE-PSEUDOEPHEDRINE) One by mouth  once daily for allergies as needed  #30 Tablet x 5   Entered by:   Gladis Riffle, RN   Authorized by:   Birdie Sons MD   Signed by:   Gladis Riffle, RN on 03/30/2010   Method used:   Electronically to        [facility] (retail)       [address]       [place], [place]  [address]       Ph: [phone]       Fax: [phone]   RxID:   [id number]    Preventive Care Screening  Colonoscopy:    Next Due:  10/2014  Mammogram:    Date:  02/05/2010    Next Due:  02/2012    Results:  normalpt's report   Pap Smear:    Date:  02/05/2010    Next Due:  02/2013    Results:  normal-pt's report    Appended Document: cpx//ccm shingles  vaccine  Appended Document: cpx//ccm   Immunizations Administered:  Zostavax # 1:    Vaccine Type: Zostavax    Site: left deltoid    Mfr: Merck    Dose: 0.5 ml    Route: Gervais    Given by: Gladis Riffle, RN    Exp. Date: 03/12/2011    Lot #: [id number]    VIS given: 05/20/05 given March 30, 2010.

## 2010-09-07 NOTE — Assessment & Plan Note (Signed)
Summary: GERD, nausea with vomiting    History of Present Illness Visit Type: new patient  Primary GI MD: Yancey Flemings MD Primary Provider: Birdie Sons, MD  Requesting Provider: na Chief Complaint: Pt c/o of GERD, and at 2am patient states that she wakes up with nausea and will begin to vomit but rest of the day patient states that she is ok History of Present Illness:   60 year old female with hypertension, hyperlipidemia, GERD, and osteoarthritis. She presents today for routine followup regarding management of GERD as well as new complaints of intermittent nausea with vomiting. She previously underwent colonoscopy and upper endoscopy in 2006. Upper endoscopy was unremarkable. Colonoscopy revealed a normal colon. 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 Johna Sheriff. She reports 6 month history of being awoken at night with nausea followed by vomiting. This has occurred 3-4 times over the past 6 months. She vomits up the previously consumed meal. Sick for about one hour then returns to sleep. No problems during the day. She takes Nexium 40 mg each morning. Her weight has been stable. No other GI complaints.   GI Review of Systems    Reports acid reflux, heartburn, nausea, and  vomiting.      Denies abdominal pain, belching, bloating, chest pain, dysphagia with liquids, dysphagia with solids, loss of appetite, vomiting blood, weight loss, and  weight gain.        Denies anal fissure, black tarry stools, change in bowel habit, constipation, diarrhea, diverticulosis, fecal incontinence, heme positive stool, hemorrhoids, irritable bowel syndrome, jaundice, light color stool, liver problems, rectal bleeding, and  rectal pain.    Current Medications (verified): 1)  Hydrochlorothiazide 25 Mg Tabs (Hydrochlorothiazide) .... Take 1 Tablet By Mouth Every Morning 2)  Nexium 40 Mg Cpdr (Esomeprazole Magnesium) .... Take 1 Capsule By Mouth Once A Day 3)  Valtrex 500 Mg Tabs  (Valacyclovir Hcl) .... Take 1 Tablet By Mouth Once A Day 4)  Aspirin 81 Mg  Tbec (Aspirin) .... Once Daily 5)  Coq-10 150 Mg  Caps (Coenzyme Q10) .... Once Daily 6)  Metamucil Plus Calcium   Caps (Psyllium-Calcium) .... As Needed 7)  Allegra-D 12 Hour 60-120 Mg Xr12h-Tab (Fexofenadine-Pseudoephedrine) .... One By Mouth Once Daily For Allergies As Needed 8)  Celebrex 200 Mg Caps (Celecoxib) .... Take 1 Tablet By Mouth Once A Day As Needed 9)  Glucosamine-Chondroitin  Caps (Glucosamine-Chondroit-Vit C-Mn) .... One Capsule By Mouth Once Daily 10)  Steroid Injection .... Injection Into Back For Back Pain  Allergies (verified): 1)  ! Zostavax (Zoster Vaccine Live)  Past History:  Past Medical History: Colonic polyps, hx of--sister with colon ca ABNORMAL REACTION/COMPLICAT D/T OTH SPEC PROCED (ICD-E879.8) DIVERTICULOSIS, COLON (ICD-562.10) HYPERLIPIDEMIA (ICD-272.4) HYPERTENSION (ICD-401.9) GERD (ICD-530.81) COLONIC POLYPS, HX OF (ICD-V12.72) PHYSICAL EXAMINATION (ICD-V70.0)  Past Surgical History: left elbow fx--cast Tubal ligation, bilateral after laparoscopy blepharoplasty  Family History: Reviewed history from 03/30/2010 and no changes required. 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  The patient denies anemia, anxiety-new, arthritis/joint pain, blood  in urine, breast changes/lumps, change in vision, confusion, coughing up blood, depression-new, fainting, fever, hearing problems, heart murmur, heart rhythm changes,  itching, menstrual pain, muscle pains/cramps, night sweats, nosebleeds, pregnancy symptoms, shortness of breath, skin rash, sleeping problems, sore throat, swelling of feet/legs, swollen lymph glands, thirst - excessive , urination - excessive , urination changes/pain, urine leakage, vision changes, and voice change.    Vital Signs:  Patient profile:   60 year old female Menstrual status:  postmenopausal Height:      67 inches Weight:      220 pounds BMI:     34.58 BSA:     2.11 Pulse rate:   60 / minute Pulse rhythm:   regular BP sitting:   128 / 76  (left arm) Cuff size:   regular  Vitals Entered By: Ok Anis CMA (May 26, 2010 10:05 AM)  Physical Exam  General:  Well developed, well nourished, no acute distress. Head:  Normocephalic and atraumatic. Eyes:  PERRLA, no icterus. Ears:  Normal auditory acuity. Nose:  No deformity, discharge,  or lesions. Mouth:  No deformity or lesions, dentition normal. Neck:  Supple; no masses or thyromegaly. Lungs:  Clear throughout to auscultation. Heart:  Regular rate and rhythm; no murmurs, rubs,  or bruits. Abdomen:  Soft,obese, nontender and nondistended. No masses, hepatosplenomegaly or hernias noted. Normal bowel sounds. No succussion splash. Msk:  Symmetrical with no gross deformities. Normal posture. Pulses:  Normal pulses noted. Extremities:  no edema Neurologic:  Alert and  oriented x4. Skin:  Intact without significant lesions or rashes. Psych:  Alert and cooperative. Normal mood and affect.   Impression & Recommendations:  Problem # 1:  NAUSEA AND VOMITING (ICD-787.01) new onset intermittent nausea and vomiting as described. Given nocturnal nature, most likely reflux related.  Plan: #1. Strict adherence to reflux precautions with particular attention to watching the size of her evening meal as well as the timing of the evening meal prior to going to bed. Additionally, weight loss #2. Continue Nexium 40 mg daily.Marland Kitchen Refill  today electronically #3. If symptoms persist, consider increasing dose of PPI and or gastric emptying scan.  Problem # 2:  GERD (ICD-530.81) ongoing. Problems with nausea and vomiting likely related. See plan as outlined above. Routine followup in one year  Problem # 3:  SCREENING COLORECTAL-CANCER (ICD-V76.51) up-to-date. Due for routine followup around 2016  Patient Instructions: 1)  Refill Nexium to [facility] x 1 year 2)  GI Reflux brochure given.  3)  Reflux Precaution sheet given along with Diet instructions. 4)  Copy sent to : Birdie Sons, MD  5)  Please schedule a follow-up appointment in 1 year. 6)  The medication list was reviewed and reconciled.  All changed / newly prescribed medications were explained.  A complete medication list was provided to the patient / caregiver. Prescriptions: NEXIUM 40 MG CPDR (ESOMEPRAZOLE MAGNESIUM) Take 1 capsule by mouth once a day  #90 x 3   Entered by:   Milford Cage NCMA   Authorized by:   Hilarie Fredrickson MD   Signed by:   Milford Cage NCMA on 05/26/2010   Method used:   Faxed to ...       [facility] (mail-order)             , [place]         Ph: [phone]       Fax: [phone]   RxID:   [id number]

## 2010-09-07 NOTE — Progress Notes (Signed)
Summary: Question about Nexium   Phone Note Call from Patient Call back at Work Phone [phone] Call back at in alot of meeting todays-ok to leave msg.   Call For: Dr [name] Reason for Call: Refill Medication Summary of Call: Question about her Nexium prescription Initial call taken by: Leanor Kail [place],  March 09, 2010 9:21 AM  Follow-up for Phone Call        Called patient and left message on voicemail for her to return my call.  Pt. called and I made her an appt. to come in on 04/21/10.  Refilled her Nexium for one year to [facility].  Milford Cage [facility]  March 09, 2010 2:11 PM  Follow-up by: Milford Cage NCMA,  March 09, 2010 1:18 PM

## 2010-09-07 NOTE — Procedures (Signed)
Summary: EGD   EGD  Procedure date:  04/08/2005  Findings:      Location: [facility]  GERD Patient Name: Cynthia Padilla, Cynthia Padilla MRN:  Procedure Procedures: Panendoscopy (EGD) CPT: 43235.  Personnel: Endoscopist: Wilhemina Bonito. [name], MD.  Referred By: Albertina Senegal, MD.  Exam Location: Exam performed in Outpatient Clinic. Outpatient  Patient Consent: Procedure, Alternatives, Risks and Benefits discussed, consent obtained, from patient. Consent was obtained by the RN.  Indications Symptoms: Chest Pain. Reflux symptoms throat clearing.  History  Current Medications: Patient is not currently taking Coumadin.  Pre-Exam Physical: Performed Apr 18, 2005  Entire physical exam was normal.  Exam Exam Info: Maximum depth of insertion Duodenum, intended Duodenum. Patient position: on left side. Vocal cords visualized. Gastric retroflexion performed. Images taken. ASA Classification: II. Tolerance: excellent.  Sedation Meds: Patient assessed and found to be appropriate for moderate (conscious) sedation. Fentanyl 100 mcg. given IV. Versed 10 mg. given IV. Cetacaine Spray given aerosolized.  Monitoring: BP and pulse monitoring done. Oximetry used. Supplemental O2 given  Findings Normal: Proximal Esophagus to Duodenal 2nd Portion.   Assessment Normal examination.  Diagnoses: 530.81: GERD. as a cause for some symptoms.   Comments: MUSCULOSKELETAL CHEST PAIN as well Events  Unplanned Intervention: No unplanned interventions were required.  Unplanned Events: There were no complications. Plans Medication(s): PPI: Esomeprazole/Nexium 40 mg BID,   Disposition: After procedure patient sent to recovery. After recovery patient sent home.  Scheduling: Follow-up prn.   This report was created from the original endoscopy report, which was reviewed and signed by the above listed endoscopist.   cc:  Lanney Gins, MD      The Patient

## 2010-09-07 NOTE — Letter (Signed)
Summary: [facility]  [facility]   Imported By: Sherian Rein 04/16/2010 12:06:41  _____________________________________________________________________  External Attachment:    Type:   Image     Comment:   External Document

## 2010-09-22 ENCOUNTER — Ambulatory Visit: Payer: Self-pay | Admitting: Internal Medicine

## 2010-10-18 ENCOUNTER — Ambulatory Visit: Payer: Self-pay | Admitting: Internal Medicine

## 2010-10-27 ENCOUNTER — Other Ambulatory Visit: Payer: Self-pay | Admitting: Dermatology

## 2010-11-02 ENCOUNTER — Ambulatory Visit (INDEPENDENT_AMBULATORY_CARE_PROVIDER_SITE_OTHER): Payer: BC Managed Care – PPO | Admitting: Internal Medicine

## 2010-11-02 ENCOUNTER — Ambulatory Visit: Payer: Self-pay | Admitting: Internal Medicine

## 2010-11-02 ENCOUNTER — Encounter: Payer: Self-pay | Admitting: Internal Medicine

## 2010-11-02 DIAGNOSIS — I10 Essential (primary) hypertension: Secondary | ICD-10-CM

## 2010-11-02 DIAGNOSIS — E785 Hyperlipidemia, unspecified: Secondary | ICD-10-CM

## 2010-11-02 DIAGNOSIS — K219 Gastro-esophageal reflux disease without esophagitis: Secondary | ICD-10-CM

## 2010-11-02 NOTE — Assessment & Plan Note (Signed)
Not on any meds i would expect lipids to improve with weight loss

## 2010-11-02 NOTE — Assessment & Plan Note (Signed)
Controlled on PPI Ok to continue

## 2010-11-02 NOTE — Assessment & Plan Note (Signed)
Well controlled Weight loss will likely help tremendously

## 2010-11-02 NOTE — Progress Notes (Signed)
  Subjective:    Patient ID: Cynthia Padilla, female    DOB: Jul 12, 1951, 60 y.o.   MRN: [id number]  HPI HTN---tolerating meds  GERD--tolerating meds  Lipids---no meds  Cynthia Padilla is now on weight watchers. Not exercising---she blames on recent back injury  Past Medical History  Diagnosis Date  . History of colonic polyps   . Diverticulitis   . Hyperlipidemia   . Hypertension   . GERD (gastroesophageal reflux disease)    Past Surgical History  Procedure Date  . Left elbow     fx- cast   . Tubal ligation     reports that she has never smoked. She does not have any smokeless tobacco history on file. She reports that she drinks alcohol. Her drug history not on file. family history includes Cancer in her mother; Diabetes in her father; and Heart disease in her father. No Known Allergies   Review of Systems  patient denies chest pain, shortness of breath, orthopnea. Denies lower extremity edema, abdominal pain, change in appetite, change in bowel movements. Patient denies rashes, musculoskeletal complaints. No other specific complaints in a complete review of systems.      Objective:   Physical Exam  Well-developed well-nourished female in no acute distress. HEENT exam atraumatic, normocephalic, extraocular muscles are intact. Neck is supple. No jugular venous distention no thyromegaly. Chest clear to auscultation without increased work of breathing. Cardiac exam S1 and S2 are regular. Abdominal exam active bowel sounds, soft, nontender. Extremities no edema. Neurologic exam she is alert without any motor sensory deficits. Gait is normal.     Assessment & Plan:

## 2010-11-12 ENCOUNTER — Other Ambulatory Visit: Payer: Self-pay | Admitting: Internal Medicine

## 2010-11-12 DIAGNOSIS — I10 Essential (primary) hypertension: Secondary | ICD-10-CM

## 2010-12-21 NOTE — Assessment & Plan Note (Signed)
[name]                         GASTROENTEROLOGY OFFICE NOTE   Cynthia, [name]                       MRN:          [id number]  DATE:02/28/2007                            DOB:          01/19/1951    Cynthia Padilla schedules herself today for office evaluation regarding rectal  bleeding and epigastric pain, as well as reflux disease.  The patient is  a 60 year old with a history of gastroesophageal reflux disease, left-  sided diverticulosis, and squamous dysplasia of the anus, for which she  is now followed by Dr. Johna Padilla.  The patient reports occasionally  having minor rectal bleeding after defecation.  Her last evaluation with  Dr. Johna Padilla occurred January 17, 2007.  This included anoscopy with  excision of a benign lesion.  Her problems with bleeding were felt due  to a small internal hemorrhoid.  She denies lower abdominal pain or  other complaints.  She did have a complete colonoscopy in March of 2006.   While in [place], the patient was awoken at 2 a.m. with nausea and  vomiting, and epigastric pain.  This persisted for several hours.  She  went to the emergency room and was told that she had an elevated white  blood cell count.  Other laboratories were apparently normal.  CT scan  of the abdomen and pelvis was said to be negative.  She was treated with  a GI cocktail and morphine sulfate.  After about 3 to 4 hours, her pain  resolved.  She was told it was reflux.  She has continued on Nexium for  reflux.  She denies worsening indigestion, or heartburn.  She has had no  prior episodes of similar pain and has had no pain since.   CURRENT MEDICATIONS:  Nexium.  Hydrochlorothiazide.  Baby aspirin.  Xanax p.r.n.   PHYSICAL EXAM:  Well-appearing female in no acute distress.  Blood pressure 116/80, heart rate is 60 and regular, weight is 205  pounds.  HEENT:  Sclerae anicteric.  LUNGS:  Clear.  HEART:  Regular.  ABDOMEN:  Soft without tenderness,  mass, or hernia.  Good bowel sounds  heard.   IMPRESSION:  1. Minor rectal bleeding due to internal hemorrhoid.  2. Isolated episode of epigastric pain, nausea and vomiting, and      leukocytosis.  Possibly acute gastroenteritis.  Rule out biliary      colic.  3. Gastroesophageal reflux disease.  Stable on Nexium.  The patient      requested refill of Nexium.  4. History of squamous dysplasia of the anus. Followed by Dr.      Johna Padilla.   RECOMMENDATIONS:  1. Reassurance with regard to rectal bleeding.Ongoing periodic      followup with Dr. Johna Padilla per his instructions.  2. Schedule abdominal ultrasound to rule out gall stones.  3. Refill Nexium.  4. Reflux precautions.  5. GI followup in 1 year unless interval questions or problems.     Cynthia Padilla. Cynthia Goodell, MD  Electronically Signed    JNP/MedQ  DD: 02/28/2007  DT: 02/28/2007  Job #: [id number]   cc:   Cynthia Padilla  Cynthia Padilla, M.D.

## 2010-12-24 NOTE — Discharge Summary (Signed)
Cynthia Padilla, Cynthia Padilla              ACCOUNT NO.:  [id number]   MEDICAL RECORD NO.:  [id number]          PATIENT TYPE:  INP   LOCATION:  5729                         FACILITY:  [facility]   PHYSICIAN:  Rachael Fee, M.D. DATE OF BIRTH:  Dec 16, 1950   DATE OF ADMISSION:  05/12/2005  DATE OF DISCHARGE:  05/13/2005                                 DISCHARGE SUMMARY   ADMITTING DIAGNOSES:  1.  Bleeding per rectum associated with left lower quadrant pain, rule out      colitis question ischemic, rule out diverticulitis and diverticular      bleed, rule out bleeding secondary to known dysplasia of the rectum.  2.  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  3.  History of colon polyps removed in the past by Dr. Clement Sayres in [facility].  4.  History of a left elbow fracture in the 1980s.  5.  Gastroesophageal reflux disease.  6.  Hypertension.   PRIOR ENDOSCOPIC PROCEDURES:  1.  Colonoscopy on November 04, 3004.  Colonoscopy study as reported in the      text.  2.  Upper endoscopy on April 18, 2005.  The upper endoscopy study was      normal.   DISCHARGE DIAGNOSES:  1.  Rectal bleeding, etiology not determined, not associated with anemia.      Question secondary to known history of anorectal dysplasia, question      secondary to hemorrhoids, question secondary to diverticulosis, question      secondary to mild colitis though CT scan negative for any colitis or      diverticulitis.  2.  Left lower quadrant pain.  No evidence on CT scan for diverticulitis.      Question secondary to diverticulosis and irritable bowel syndrome.  3.  Hypokalemia, corrected with oral supplementation.  4.  Mild hyperglycemia.  The patient not known to have a history of glucose      intolerance or diabetes.   CONSULTATIONS:  None.   PROCEDURES:  None.   BRIEF HISTORY:  Cynthia Padilla is a pleasant, generally healthy, 60 year old,  white female.  Her past medical history is listed above.  Significant  to  this admission is the fact that she underwent a screening colonoscopy, on  November 04, 2004, because of abdominal pain and bloating and because of a  family history of a sister with anal carcinoma.  She had also had prior  history of colon polyps, though it is not clear whether these were  adenomatous or not.  Dr. [name] encountered diverticulosis in the left colon  into the sigmoid.  He also saw some subtle irregularity at the anal verge,  question papilloma versus normal.  This was biopsied and the biopsy revealed  high grade dysplasia.  On Dec 20, 2004, Dr. Johna Sheriff examined and biopsied  the area under general anesthesia.  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  Interestingly,  she has never had irregular Pap smears or any history of genital HPV.  The  patient suffers from  intermittent left lower quadrant discomfort that is  mild in nature.  It is not particularly associated with change of bowel  habits and there is no nausea with it.  She also suffers from bouts of acute  left lower quadrant that has been labeled as diverticulitis and treated in  the past by antibiotics, so it does not sound like there have ever been CT  scans to actually confirm the diagnosis of diverticulitis.  She had a bout  of this about three weeks prior to admission and had some Cipro in her  medicine cabinet which she took twice daily for about five days.  The pain  subsided but she never really went back to her GI baseline.  Four days prior  to admission, the patient began passing blood with her bowel movements and  over the course of ensuing days it became larger in volume but only  occurring about once to twice daily.  She started to feel kind of weak but  no fever.  She was not having any presyncope.  There was some slight nausea.  The patient does not any nonsteroidals but she does take a daily aspirin  every day.   The patient was evaluated in  the emergency room with labs and CT scan.  The  only abnormality was a low potassium at 3.2.  Her white count was normal.  Her hemoglobin was normal.  She was admitted by Dr. Christella Hartigan for supportive  care to an observation bed.   LABORATORY:  Initial hemoglobin was 13.8.  On recheck it was 13.1.  White  blood cell count 8.7, MCV 91, platelet count 256,000.  Sodium 139, potassium  initially 3.2, corrected to 3.6, glucose was 119, BUN 9, creatinine 0.9.  Total bilirubin and all other LFTs were within normal limits.  CT scan of  the abdomen the official report is not out but the brief reading shows no  acute findings including colitis or diverticulitis.   HOSPITAL COURSE:  The patient was admitted to a non-monitored observation  bed.  She was hydrated with normal saline.  She was given oral potassium  supplementation and clear liquids.  Overnight she did use some of the  Demerol and Phenergan which was available to her.  However, after a single  dose of this she was not having significant enough pain to require these  medications and she did not like the drowsy feeling that they gave her.  She  did not have any further episodes of bleeding per rectum.  The pain was  still there but not as severe.  Followup labs showed a little bit of a drop  in the hemoglobin but probably this is secondary to the hydration effects of  the IV fluids.  Her potassium level had normalized.  Rectal exam was benign  for any hemorrhoids or palpable masses.   The overall working diagnosis as to the source of the pain was irritable  bowel syndrome compounded by sigmoid diverticulosis.  As to the rectal  bleeding, it was felt that because of the patient's history of anal  dysplasia that she should follow up with Dr. [name] in about six weeks' time  and be considered for survey flexible sigmoidoscopy with biopsy of the anal  area in order to assure stability of the low grade dysplasia.  The patient does take a full  strength aspirin every day and we will ask her  to restart aspirin at a lower dose on May 23, 2005.  She  was also  provided with a prescription for longacting hyoscyamine to be used for her  left lower quadrant pain.  She can also use over the counter stool softeners  in addition to the fiber supplements which she uses in order to try to  minimize the left lower quadrant discomfort.   The patient's urinary review of systems was negative, however, if she  continues to have more than her usual mild left lower quadrant pain,  providers might want to consider checking a urinalysis, though there is  nothing other than the pain to suggest that this is urinary in nature.   DISCHARGE MEDICATIONS:  1.  Nexium 40 mg once daily.  2.  Aspirin 81 mg once daily to be restarted May 23, 2005.  3.  Calcium, dose not known, once daily.  4.  Tylenol as needed.  5.  Hydrochlorothiazide, dose not known, once daily.  6.  Hyoscyamine 0.375 mg, one to two tablets twice daily as needed for      abdominal pain and spasm.  7.  Fiber supplement tablets, 2-3 per day.  8.  Stool softener one to two per day p.r.n. directed.   FOLLOWUP OFFICE VISIT:  With Dr. Yancey Flemings on June 23, 2005.      Jennye Moccasin, P.A. [facility]    ______________________________  Rachael Fee, M.D.    SG/MEDQ  D:  05/13/2005  T:  05/13/2005  Job:  [id number]   cc:   Albertina Senegal  Fax: [phone]   Wilhemina Bonito. [name], M.D. [facility]  [address]  [place]  [place] [address]

## 2010-12-24 NOTE — Op Note (Signed)
[name], [name]              ACCOUNT NO.:  [id number]   MEDICAL RECORD NO.:  [id number]          PATIENT TYPE:  AMB   LOCATION:  DSC                          FACILITY:  [facility]   PHYSICIAN:  Sharlet Salina T. Hoxworth, M.D.DATE OF BIRTH:  06-22-1951   DATE OF PROCEDURE:  12/20/2004  DATE OF DISCHARGE:                                 OPERATIVE REPORT   PRE- AND POSTOPERATIVE DIAGNOSIS:  High-grade dysplasia, anoderm.   POSTOPERATIVE DIAGNOSIS:  High-grade dysplasia, anoderm.   SURGICAL PROCEDURES:  Exam under anesthesia with random anal biopsies.   SURGEON:  Lorne Skeens. Hoxworth, M.D.   ANESTHESIA:  General.   BRIEF HISTORY:  Cynthia Padilla is a 60 year old female who on recent routine  screening colonoscopy was noted on withdrawal the scope to have a very tiny  projection of the skin on the anoderm, questioned even normal papilla  however, this was biopsied which has revealed high-grade intraepithelial  dysplasia. The patient is asymptomatic and examine in the office shows no  anorectal abnormalities. However, due to the recent biopsy and continued  concern we have elected to proceed with careful exam under anesthesia and  anal biopsies.  The nature of the procedure, indications, risks of bleeding,  infection discussed and understood. Now brought to the operating room for  this procedure.   DESCRIPTION OF PROCEDURE:  The patient was brought to the operating room and  placed in supine position on the operating table and general anesthesia was  induced. She was then carefully positioned in lithotomy position, the  perineum sterilely prepped and draped. Anus was gently dilated two fingers.  The Sims retractor was then used and the entire anal canal was carefully  inspected. There were some moderate internal hemorrhoids but I did not see  any abnormalities of the anoderm or dentate line. As planned, I did then  proceed with four essentially random biopsies at 12, 3, 6 and 9 o'clock  of  the anoderm at the dentate line.  Hemostasis was obtained with cautery.  The  area was infiltrated with Marcaine with epinephrine. Dry gauze dressing was  applied. The patient taken recovery in good condition.      BTH/MEDQ  D:  12/20/2004  T:  12/20/2004  Job:  [id number]

## 2011-02-13 ENCOUNTER — Other Ambulatory Visit: Payer: Self-pay | Admitting: Internal Medicine

## 2011-03-09 LAB — HM PAP SMEAR

## 2011-03-25 ENCOUNTER — Other Ambulatory Visit (INDEPENDENT_AMBULATORY_CARE_PROVIDER_SITE_OTHER): Payer: BC Managed Care – PPO

## 2011-03-25 DIAGNOSIS — Z0000 Encounter for general adult medical examination without abnormal findings: Secondary | ICD-10-CM

## 2011-03-25 LAB — POCT URINALYSIS DIPSTICK
Bilirubin, UA: NEGATIVE
Glucose, UA: NEGATIVE
Ketones, UA: NEGATIVE
Spec Grav, UA: 1.015

## 2011-03-25 LAB — CBC WITH DIFFERENTIAL/PLATELET
Basophils Absolute: 0 10*3/uL (ref 0.0–0.1)
Eosinophils Relative: 2 % (ref 0.0–5.0)
HCT: 43.8 % (ref 36.0–46.0)
Hemoglobin: 14.5 g/dL (ref 12.0–15.0)
Lymphs Abs: 3.3 10*3/uL (ref 0.7–4.0)
MCV: 94.3 fl (ref 78.0–100.0)
Monocytes Absolute: 0.4 10*3/uL (ref 0.1–1.0)
Monocytes Relative: 5.4 % (ref 3.0–12.0)
Neutro Abs: 3.8 10*3/uL (ref 1.4–7.7)
RDW: 12.9 % (ref 11.5–14.6)

## 2011-03-25 LAB — HEPATIC FUNCTION PANEL
ALT: 29 U/L (ref 0–35)
AST: 33 U/L (ref 0–37)
Albumin: 4.6 g/dL (ref 3.5–5.2)

## 2011-03-25 LAB — LIPID PANEL: Cholesterol: 198 mg/dL (ref 0–200)

## 2011-03-25 LAB — TSH: TSH: 0.72 u[IU]/mL (ref 0.35–5.50)

## 2011-03-25 LAB — BASIC METABOLIC PANEL
Chloride: 105 mEq/L (ref 96–112)
GFR: 71.04 mL/min (ref >60.00)
Glucose, Bld: 113 mg/dL — ABNORMAL HIGH (ref 70–99)
Potassium: 4.4 mEq/L (ref 3.5–5.1)
Sodium: 142 mEq/L (ref 135–145)

## 2011-04-01 ENCOUNTER — Encounter: Payer: Self-pay | Admitting: Internal Medicine

## 2011-04-01 ENCOUNTER — Ambulatory Visit (INDEPENDENT_AMBULATORY_CARE_PROVIDER_SITE_OTHER): Payer: BC Managed Care – PPO | Admitting: Internal Medicine

## 2011-04-01 VITALS — BP 120/88 | Temp 97.4°F | Ht 67.5 in | Wt 216.0 lb

## 2011-04-01 DIAGNOSIS — Z0000 Encounter for general adult medical examination without abnormal findings: Secondary | ICD-10-CM

## 2011-04-01 DIAGNOSIS — R739 Hyperglycemia, unspecified: Secondary | ICD-10-CM

## 2011-04-01 NOTE — Progress Notes (Signed)
  Subjective:    Patient ID: Cynthia Padilla, female    DOB: 05-29-1951, 60 y.o.   MRN: [id number]  HPI  cpx  Past Medical History  Diagnosis Date  . History of colonic polyps   . Diverticulitis   . Hyperlipidemia   . Hypertension   . GERD (gastroesophageal reflux disease)    Past Surgical History  Procedure Date  . Left elbow     fx- cast   . Tubal ligation     reports that she has never smoked. She does not have any smokeless tobacco history on file. She reports that she drinks alcohol. Her drug history not on file. family history includes Cancer in her mother; Diabetes in her brothers, father, and sisters; and Heart disease in her father. No Known Allergies   Review of Systems  patient denies chest pain, shortness of breath, orthopnea. Denies lower extremity edema, abdominal pain, change in appetite, change in bowel movements. Patient denies rashes, musculoskeletal complaints. No other specific complaints in a complete review of systems.      Objective:   Physical Exam   Well-developed well-nourished female in no acute distress. HEENT exam atraumatic, normocephalic, extraocular muscles are intact. Neck is supple. No jugular venous distention no thyromegaly. Chest clear to auscultation without increased work of breathing. Cardiac exam S1 and S2 are regular. Abdominal exam active bowel sounds, soft, nontender. Extremities no edema. Neurologic exam she is alert without any motor sensory deficits. Gait is normal.       Assessment & Plan:  Well visit health maint UTD

## 2011-04-07 ENCOUNTER — Other Ambulatory Visit: Payer: Self-pay | Admitting: Obstetrics and Gynecology

## 2011-05-23 ENCOUNTER — Ambulatory Visit: Payer: BC Managed Care – PPO | Admitting: Internal Medicine

## 2011-07-18 ENCOUNTER — Other Ambulatory Visit: Payer: BC Managed Care – PPO

## 2011-07-19 ENCOUNTER — Other Ambulatory Visit (INDEPENDENT_AMBULATORY_CARE_PROVIDER_SITE_OTHER): Payer: Worker's Compensation

## 2011-07-19 DIAGNOSIS — R739 Hyperglycemia, unspecified: Secondary | ICD-10-CM

## 2011-07-19 DIAGNOSIS — Z0000 Encounter for general adult medical examination without abnormal findings: Secondary | ICD-10-CM

## 2011-07-19 DIAGNOSIS — R7309 Other abnormal glucose: Secondary | ICD-10-CM

## 2011-07-19 LAB — CBC WITH DIFFERENTIAL/PLATELET
Basophils Absolute: 0 10*3/uL (ref 0.0–0.1)
Basophils Relative: 0.6 % (ref 0.0–3.0)
Eosinophils Absolute: 0.2 10*3/uL (ref 0.0–0.7)
Hemoglobin: 14.6 g/dL (ref 12.0–15.0)
Lymphocytes Relative: 42.9 % (ref 12.0–46.0)
Lymphs Abs: 3.2 10*3/uL (ref 0.7–4.0)
MCHC: 33.9 g/dL (ref 30.0–36.0)
MCV: 94.6 fl (ref 78.0–100.0)
Monocytes Absolute: 0.4 10*3/uL (ref 0.1–1.0)
Neutro Abs: 3.6 10*3/uL (ref 1.4–7.7)
RDW: 13.1 % (ref 11.5–14.6)

## 2011-07-19 LAB — POCT URINALYSIS DIPSTICK
Blood, UA: NEGATIVE
Ketones, UA: NEGATIVE
Protein, UA: NEGATIVE
Spec Grav, UA: 1.015
Urobilinogen, UA: 0.2
pH, UA: 6.5

## 2011-07-19 LAB — LIPID PANEL
Cholesterol: 235 mg/dL — ABNORMAL HIGH (ref 0–200)
HDL: 54.6 mg/dL (ref >39.00)
VLDL: 26 mg/dL (ref 0.0–40.0)

## 2011-07-19 LAB — HEPATIC FUNCTION PANEL
ALT: 22 U/L (ref 0–35)
Albumin: 4.5 g/dL (ref 3.5–5.2)
Alkaline Phosphatase: 69 U/L (ref 39–117)
Total Protein: 7.4 g/dL (ref 6.0–8.3)

## 2011-07-19 LAB — BASIC METABOLIC PANEL
Chloride: 102 mEq/L (ref 96–112)
GFR: 79.43 mL/min (ref >60.00)
Potassium: 4.1 mEq/L (ref 3.5–5.1)

## 2011-07-19 LAB — HEMOGLOBIN A1C: Hgb A1c MFr Bld: 6 % (ref 4.6–6.5)

## 2011-07-25 ENCOUNTER — Ambulatory Visit (INDEPENDENT_AMBULATORY_CARE_PROVIDER_SITE_OTHER): Payer: Worker's Compensation | Admitting: Internal Medicine

## 2011-07-25 ENCOUNTER — Encounter: Payer: Self-pay | Admitting: Internal Medicine

## 2011-07-25 DIAGNOSIS — R739 Hyperglycemia, unspecified: Secondary | ICD-10-CM

## 2011-07-25 DIAGNOSIS — J069 Acute upper respiratory infection, unspecified: Secondary | ICD-10-CM

## 2011-07-25 DIAGNOSIS — E785 Hyperlipidemia, unspecified: Secondary | ICD-10-CM

## 2011-07-25 DIAGNOSIS — I10 Essential (primary) hypertension: Secondary | ICD-10-CM

## 2011-07-25 DIAGNOSIS — R7309 Other abnormal glucose: Secondary | ICD-10-CM

## 2011-07-25 MED ORDER — HYDROCODONE-HOMATROPINE 5-1.5 MG/5ML PO SYRP: 5.0000 mL | ORAL_SOLUTION | Freq: Three times a day (TID) | ORAL | Status: AC | PRN

## 2011-07-25 NOTE — Assessment & Plan Note (Signed)
Fair control--- Reviewed labs

## 2011-07-25 NOTE — Assessment & Plan Note (Signed)
Recommended aggressive weight loss

## 2011-07-25 NOTE — Progress Notes (Signed)
Patient ID: Cynthia Padilla, female   DOB: 04-16-1951, 60 y.o.   MRN: [id number] htn--tolerating meds without difficulty  GERD--well controlled  New problem---uri sxs for 5 days. No fever, feels week and fatigued. Multiple family members with similar sxs.  Lipid:  Lab Results  Component Value Date   CHOL 235* 07/19/2011   HDL 54.60 07/19/2011   LDLCALC 122* 03/25/2011   LDLDIRECT 149.1 07/19/2011   TRIG 130.0 07/19/2011   CHOLHDL 4 07/19/2011   Currently no meds  Past Medical History  Diagnosis Date  . History of colonic polyps   . Diverticulitis   . Hyperlipidemia   . Hypertension   . GERD (gastroesophageal reflux disease)     History   Social History  . Marital Status: Married    Spouse Name: N/A    Number of Children: N/A  . Years of Education: N/A   Occupational History  . Not on file.   Social History Main Topics  . Smoking status: Never Smoker   . Smokeless tobacco: Not on file  . Alcohol Use: Yes     one or less  . Drug Use:   . Sexually Active:    Other Topics Concern  . Not on file   Social History Narrative  . No narrative on file    Past Surgical History  Procedure Date  . Left elbow     fx- cast   . Tubal ligation     Family History  Problem Relation Age of Onset  . Cancer Mother     lung cancer  . Diabetes Father   . Heart disease Father   . Diabetes Sister   . Diabetes Brother   . Diabetes Brother   . Diabetes Brother   . Diabetes Sister     No Known Allergies  Current Outpatient Prescriptions on File Prior to Visit  Medication Sig Dispense Refill  . aspirin 81 MG tablet Take 81 mg by mouth daily.        . celecoxib (CELEBREX) 200 MG capsule Take 200 mg by mouth. 1 tab by mouth once a day as needed        . Coenzyme Q10 (COQ-10) 150 MG CAPS daily.        Marland Kitchen esomeprazole (NEXIUM) 40 MG capsule Take 40 mg by mouth daily.        . fexofenadine-pseudoephedrine (ALLEGRA-D) 60-120 MG per tablet Take 1 tablet by mouth. 1 by mouth once  daily for allergies as needed       . hydrochlorothiazide 25 MG tablet TAKE  ONE TABLET BY MOUTH EVERY MORNING  90 tablet  1  . HYDROcodone-acetaminophen (NORCO) 5-325 MG per tablet Take 1 tablet by mouth every 6 (six) hours as needed.       . psyllium 0.52 GM capsule Take 0.52 g by mouth as needed.        . traMADol (ULTRAM) 50 MG tablet Take 50 mg by mouth every 6 (six) hours as needed.       . valACYclovir (VALTREX) 500 MG tablet Take 500 mg by mouth daily.           patient denies chest pain, shortness of breath, orthopnea. Denies lower extremity edema, abdominal pain, change in appetite, change in bowel movements. Patient denies rashes, musculoskeletal complaints. No other specific complaints in a complete review of systems.   BP 134/90  Pulse 64  Temp(Src) 98.1 F (36.7 C) (Oral)  Ht 5' 7.5" (1.715 m)  Wt 213 lb (96.616  kg)  BMI 32.87 kg/m2  Well-developed well-nourished female in no acute distress. HEENT exam atraumatic, normocephalic, extraocular muscles are intact. Neck is supple. No jugular venous distention no thyromegaly. Chest clear to auscultation without increased work of breathing. Cardiac exam S1 and S2 are regular. Abdominal exam active bowel sounds, soft, nontender. Extremities no edema. Neurologic exam she is alert without any motor sensory deficits. Gait is normal.

## 2011-07-25 NOTE — Assessment & Plan Note (Signed)
Lab Results  Component Value Date   CHOL 235* 07/19/2011   HDL 54.60 07/19/2011   LDLCALC 122* 03/25/2011   LDLDIRECT 149.1 07/19/2011   TRIG 130.0 07/19/2011   CHOLHDL 4 07/19/2011   Discussed need for weight loss, regular exercise and low fat diet.

## 2011-08-13 ENCOUNTER — Other Ambulatory Visit: Payer: Self-pay | Admitting: Internal Medicine

## 2011-08-15 ENCOUNTER — Other Ambulatory Visit: Payer: Self-pay | Admitting: Neurological Surgery

## 2011-08-15 ENCOUNTER — Other Ambulatory Visit (HOSPITAL_COMMUNITY): Payer: Self-pay | Admitting: Neurological Surgery

## 2011-08-15 ENCOUNTER — Other Ambulatory Visit: Payer: Self-pay | Admitting: Internal Medicine

## 2011-08-15 DIAGNOSIS — M48061 Spinal stenosis, lumbar region without neurogenic claudication: Secondary | ICD-10-CM

## 2011-08-23 ENCOUNTER — Other Ambulatory Visit (HOSPITAL_COMMUNITY): Payer: BC Managed Care – PPO

## 2011-08-24 ENCOUNTER — Ambulatory Visit (HOSPITAL_COMMUNITY)
Admission: RE | Admit: 2011-08-24 | Discharge: 2011-08-24 | Disposition: A | Discharge status: Home / Self Care | Payer: Worker's Compensation | Source: Ambulatory Visit | Attending: Neurological Surgery | Admitting: Neurological Surgery

## 2011-08-24 DIAGNOSIS — M545 Low back pain, unspecified: Secondary | ICD-10-CM | POA: Insufficient documentation

## 2011-08-24 DIAGNOSIS — R209 Unspecified disturbances of skin sensation: Secondary | ICD-10-CM | POA: Insufficient documentation

## 2011-08-24 DIAGNOSIS — M5126 Other intervertebral disc displacement, lumbar region: Secondary | ICD-10-CM | POA: Insufficient documentation

## 2011-08-24 DIAGNOSIS — M48061 Spinal stenosis, lumbar region without neurogenic claudication: Secondary | ICD-10-CM

## 2011-08-24 DIAGNOSIS — M79609 Pain in unspecified limb: Secondary | ICD-10-CM | POA: Insufficient documentation

## 2011-08-24 IMAGING — RF DG MYELOGRAM LUMBAR
13 of 15 series · 13 of 15 positions shown · IV contrast (omnipaque)
Comparison: None.

CLINICAL DATA: Low back pain extending to the left leg and foot.
Numbness.

MYELOGRAM LUMBAR
TECHNIQUE: Lumbar puncture was performed by Dr. BORBON. Following
injection of intrathecal Omnipaque contrast, spine imaging in
multiple projections was performed using fluoroscopy.
Fluoroscopy Time: 1.0 minutes.
TECHNIQUE: CT imaging of the lumbar spine was performed after
intrathecal contrast administration.  Multiplanar CT image
reconstructions were also generated.

[Series 1: run · 1 of 1 slices shown (1 of 13)]
[im 1/1]
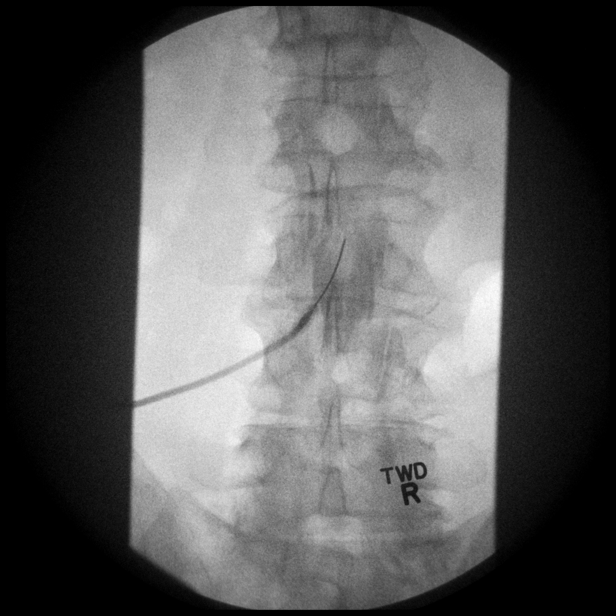

[Series 2: run · 1 of 1 slices shown (2 of 13)]
[im 1/1]
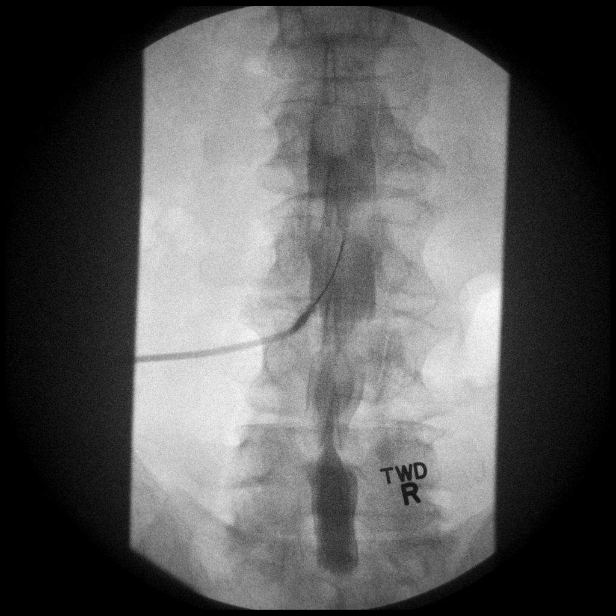

[Series 3: run · 1 of 1 slices shown (3 of 13)]
[im 1/1]
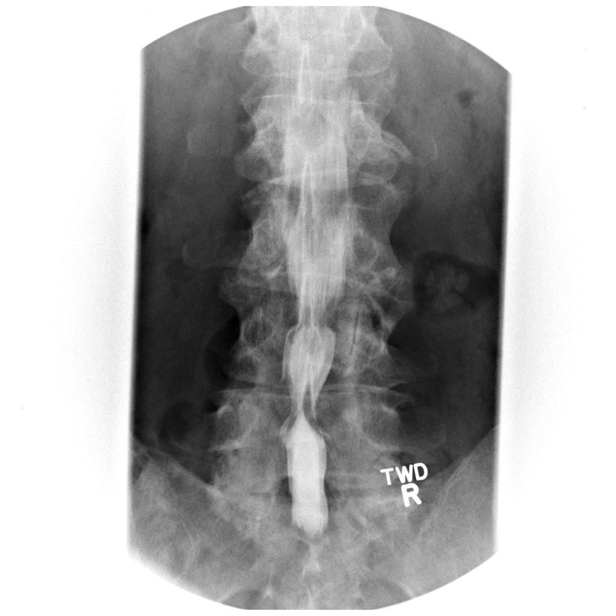

[Series 5: run · 1 of 1 slices shown (4 of 13)]
[im 1/1]
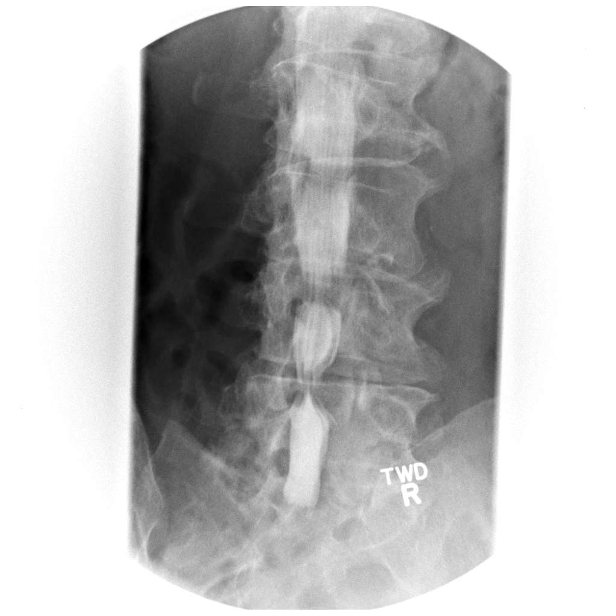

[Series 6: run · 1 of 1 slices shown (5 of 13)]
[im 1/1]
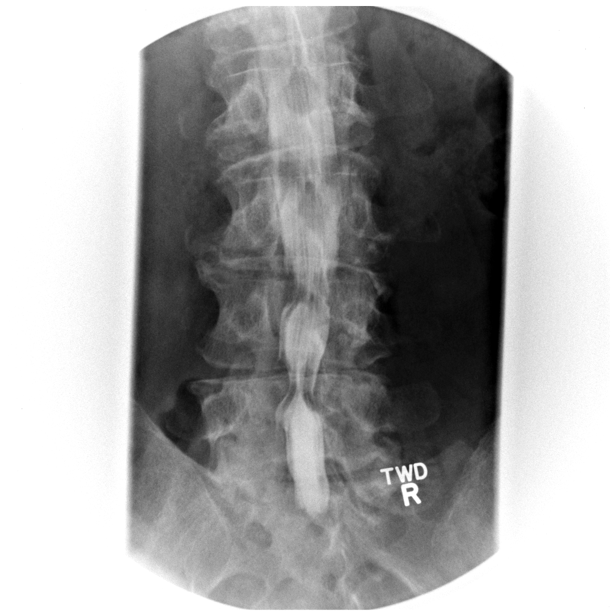

[Series 7: run · 1 of 1 slices shown (6 of 13)]
[im 1/1]
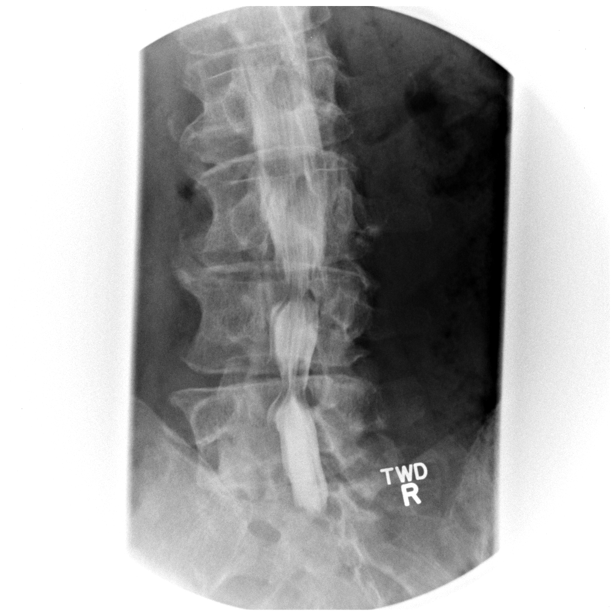

[Series 8: run · 1 of 1 slices shown (7 of 13)]
[im 1/1]
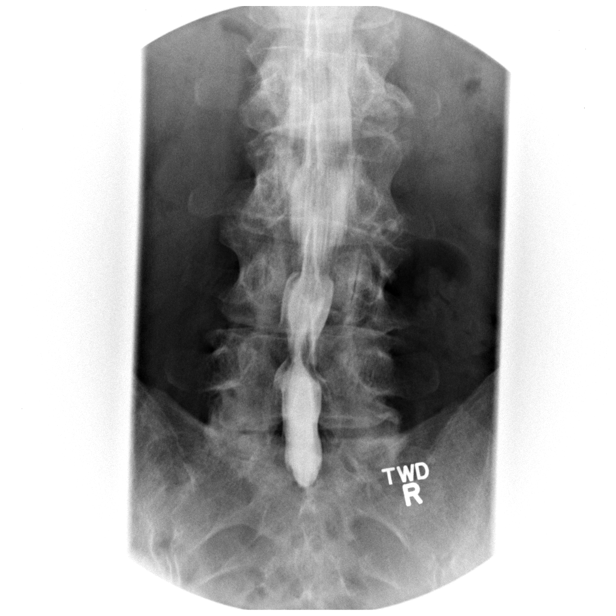

[Series 9: run · 1 of 1 slices shown (8 of 13)]
[im 1/1]
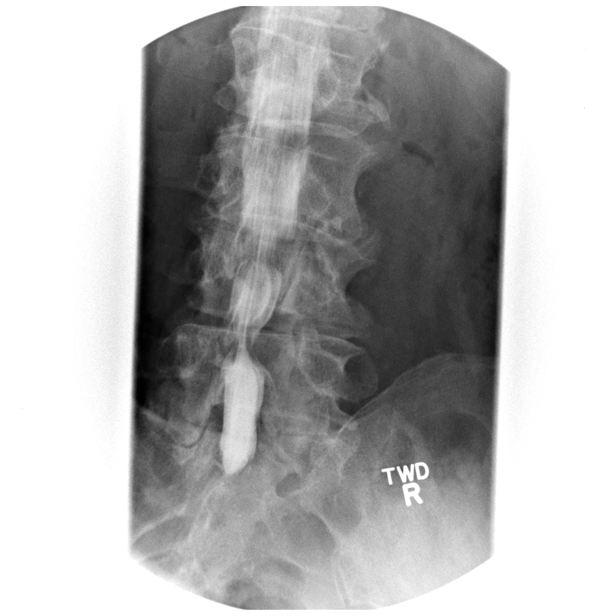

[Series 10: run · 1 of 1 slices shown (9 of 13)]
[im 1/1]
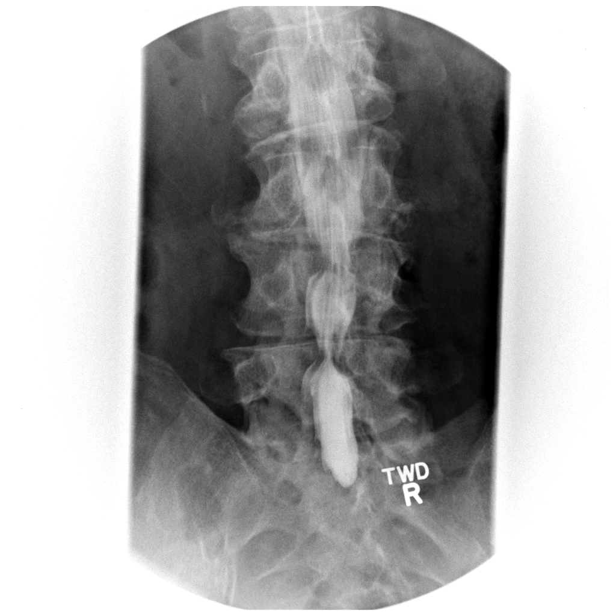

[Series 11: run · 1 of 1 slices shown (10 of 13)]
[im 1/1]
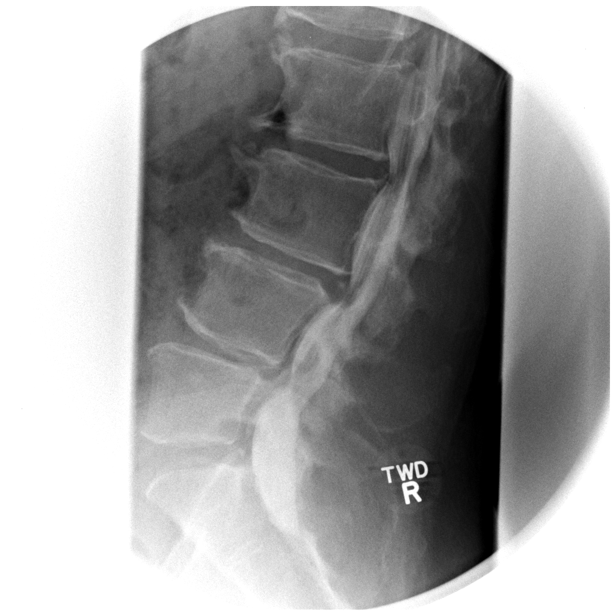

[Series 13: run · 1 of 1 slices shown (11 of 13)]
[im 1/1]
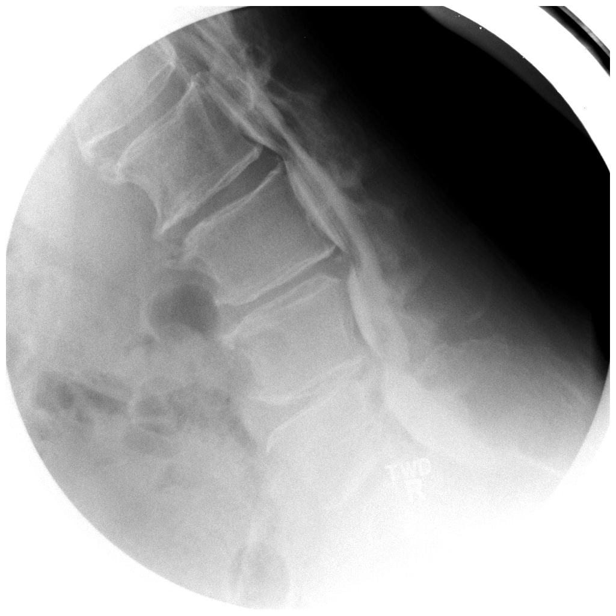

[Series 14: run · 1 of 1 slices shown (12 of 13)]
[im 1/1]
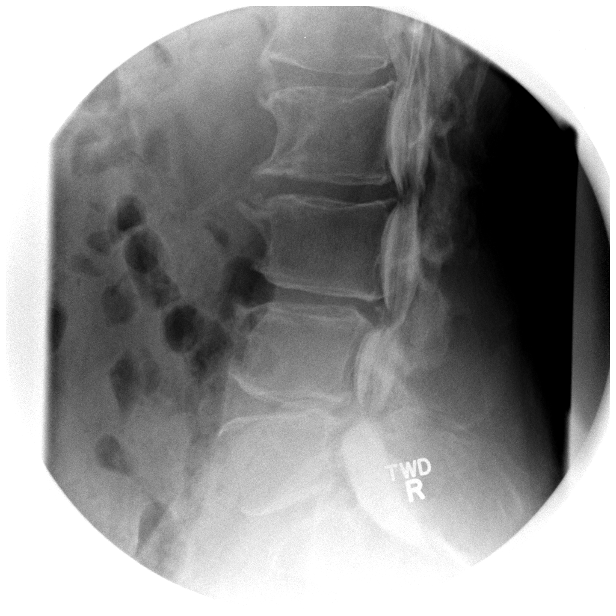

[Series 15: run · 1 of 1 slices shown (13 of 13)]
[im 1/1]
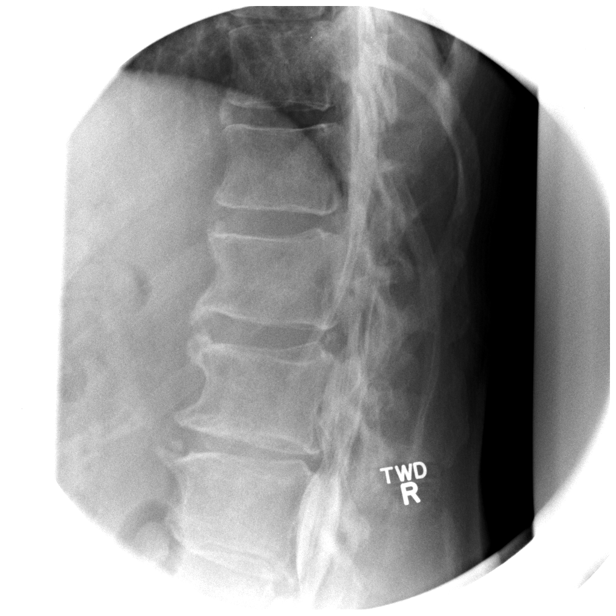

[13 of 15 positions shown; findings below may reference images not displayed]

FINDINGS: Lumbar puncture occurred at L2-3 to the right midline.

L2-3:  Anterior extradural defect with mild stenosis of both
lateral recesses.

L3-4:  Circumferential narrowing of the thecal sac.  Stenosis of
the lateral recesses could cause neural compression on either or
both sides.

L4-5:  Circumferential narrowing of the thecal sac.  Stenosis of
the lateral recesses could cause neural compression on either or
both sides.

L5-S1:  Poor filling of both S1 root sleeves, not definitely
compressive.

Standing lateral views show anterior extradural defects at L2-3, L3-
4 and L4-5.  There is straightening and slight kyphotic curvature
in this region.  With standing flexion and extension, no
subluxation occurs.
IMPRESSION: L2-3:  Anterior extradural defect.  Mild narrowing of both lateral
recesses.

L3-4:  Circumferential narrowing of the thecal sac.  Stenosis of
both lateral recesses.

L4-5:  Circumferential narrowing of the thecal sac.  Stenosis of
both lateral recesses.

L5-S1:  Poor filling of the S1 root sleeves.

CT MYELOGRAPHY LUMBAR SPINE
FINDINGS: T11-12:  Normal interspace.

T12-L1:  Normal interspace.  Conus tip at upper L1.

L1-2:  Minimal disc bulge.  Mild facet ligamentous hypertrophy.  No
compressive narrowing of the canal or foramina.

L2-3:  Moderate circumferential disc bulge.  Bilateral facet and
ligamentous hypertrophy.  Stenosis of the lateral recesses without
definite neural compression.

L3-4:  Circumferential protrusion of disc material.  Mild facet and
ligamentous hypertrophy.  Retrolisthesis of 2 mm.  Stenosis of the
lateral recesses that could cause neural compression on either or
both sides.

L4-5:  Broad-based herniation of disc material with caudal
migration worse on the right than the left.  Retrolisthesis of 2
mm.  Facet and ligamentous hypertrophy.  Stenosis of the lateral
recesses that could cause neural compression on either or both
sides.  This appears somewhat more severe on the right.

L5-S1:  Mild non compressive disc bulge. There is facet
degeneration bilaterally.  There is a calcification projecting
medially from the facet on the right that crowds the right S1 nerve
root but does not grossly compress it.
IMPRESSION: L2-3:  Disc bulge. Bilateral facet and ligamentous prominence.
Narrowing of both lateral recesses but without definite neural
compression.

L3-4:  Circumferential disc protrusion.  Facet and ligamentous
hypertrophy.  Stenosis of both lateral recesses that could cause
neural compression.

L4-5:  Broad-based disc herniation with caudal down turning,
slightly more prominent on the right.  Stenosis of the lateral
recesses that could cause neural compression on either or both
sides.

L5-S1:  Disc bulge.  Bilateral facet degeneration.  Calcification
projecting inward from the facet on the right crowds the right S1
nerve root but does not definitely compress it.

## 2011-08-24 IMAGING — CT CT L SPINE W/ CM
3 of 14 series · 11 of 36 positions shown, 12 images · IV contrast (omnipaque)
Comparison: None.

CLINICAL DATA: Low back pain extending to the left leg and foot.
Numbness.

MYELOGRAM LUMBAR
TECHNIQUE: Lumbar puncture was performed by Dr. BORBON. Following
injection of intrathecal Omnipaque contrast, spine imaging in
multiple projections was performed using fluoroscopy.
Fluoroscopy Time: 1.0 minutes.
TECHNIQUE: CT imaging of the lumbar spine was performed after
intrathecal contrast administration.  Multiplanar CT image
reconstructions were also generated.

[Series 104: coronal · coronal · 0.58mm/px · 6 of 72 slices shown]
[im 14/72  soft-tissue]
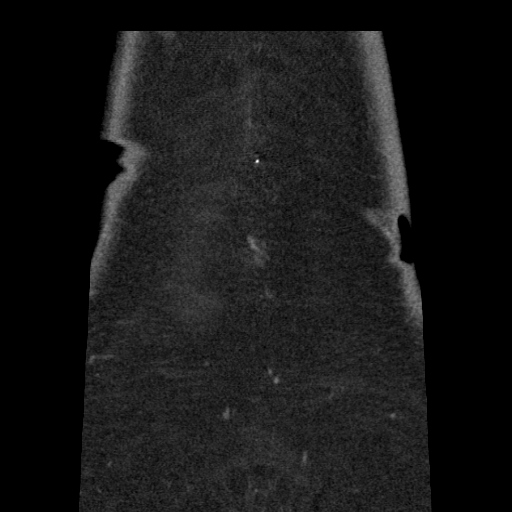
[im 24/72  bone]
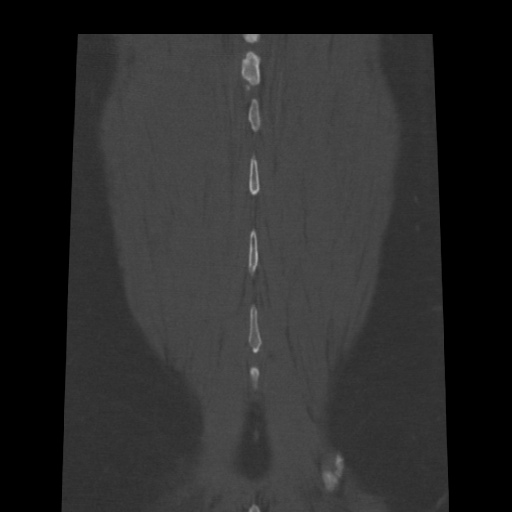
[im 30/72  bone]
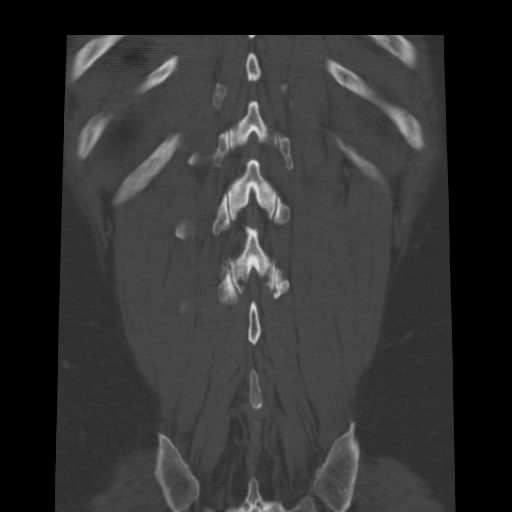
[im 36/72  bone]
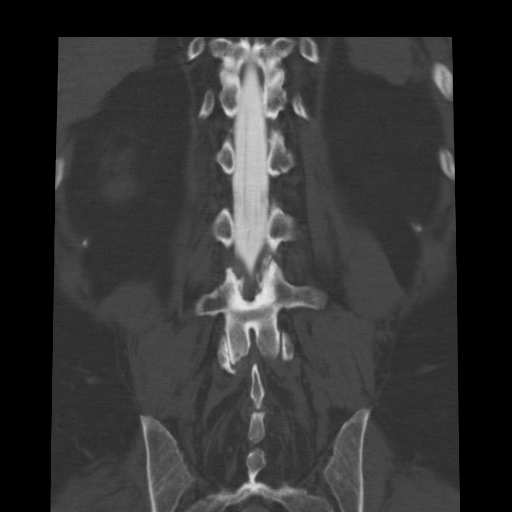
[im 42/72  bone]
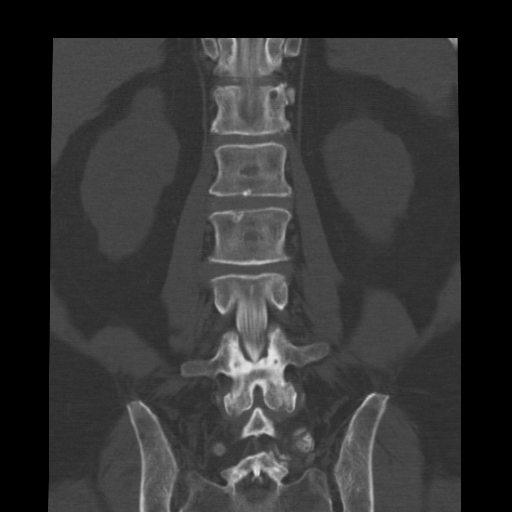
[im 48/72  bone]
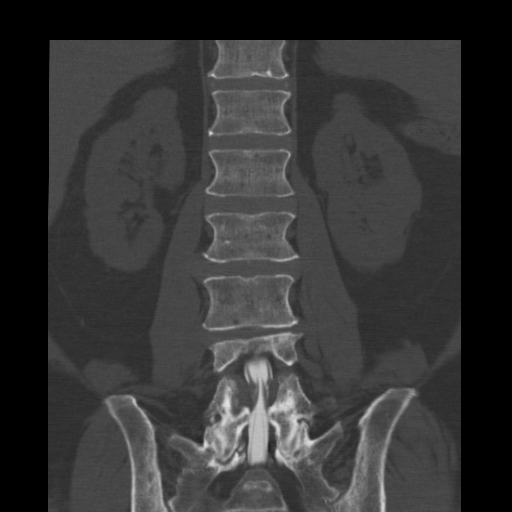

[Series 105: axial · axial · 0.27mm/px · z∈[-366,-104]mm · 2 of 133 slices shown, 3 images]
[im 1/133  soft-tissue]
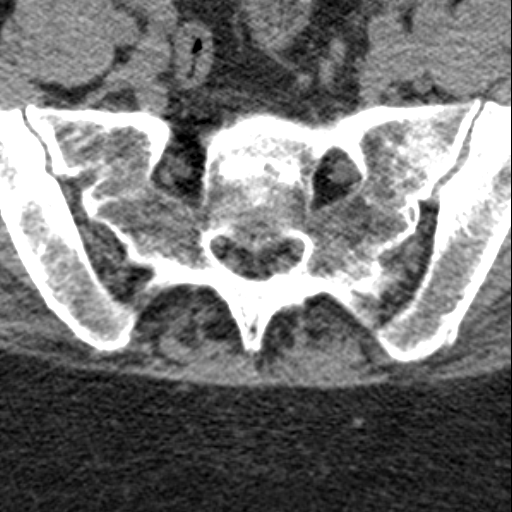
[im 1/133  bone]
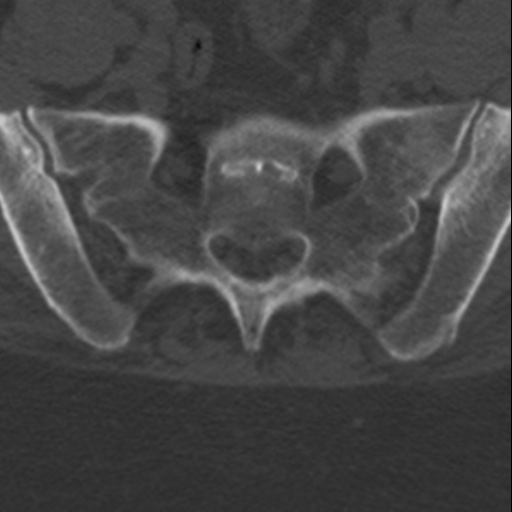
[im 133/133  bone]
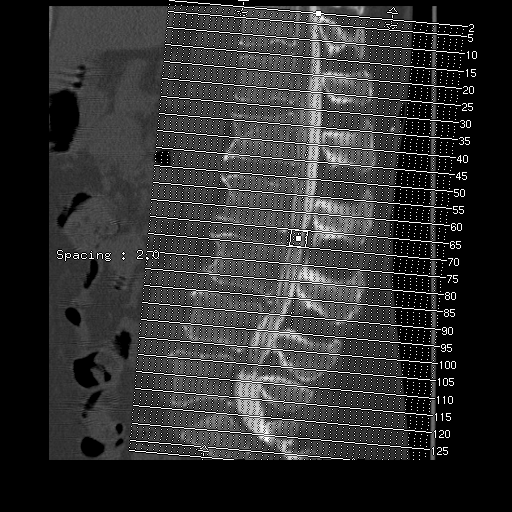

[Series 401: coronal bone · coronal · 0.58mm/px · 3 of 70 slices shown]
[im 14/70  bone]
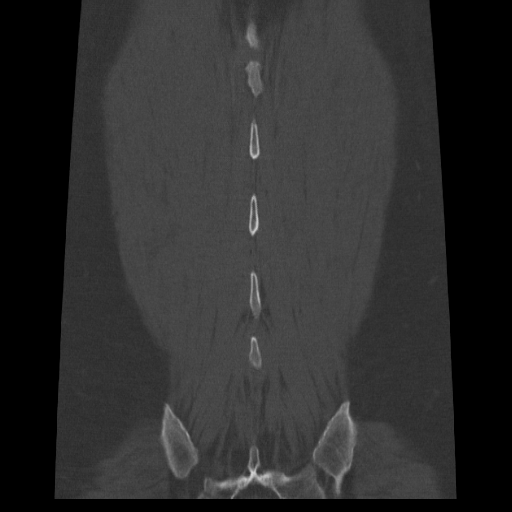
[im 28/70  bone]
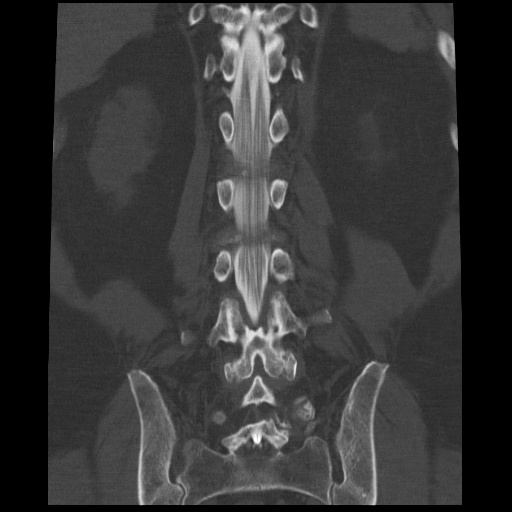
[im 42/70  bone]
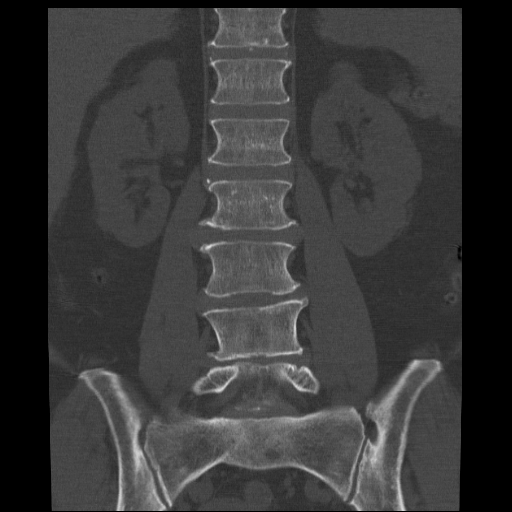

[11 of 36 positions shown; findings below may reference images not displayed]

FINDINGS: Lumbar puncture occurred at L2-3 to the right midline.

L2-3:  Anterior extradural defect with mild stenosis of both
lateral recesses.

L3-4:  Circumferential narrowing of the thecal sac.  Stenosis of
the lateral recesses could cause neural compression on either or
both sides.

L4-5:  Circumferential narrowing of the thecal sac.  Stenosis of
the lateral recesses could cause neural compression on either or
both sides.

L5-S1:  Poor filling of both S1 root sleeves, not definitely
compressive.

Standing lateral views show anterior extradural defects at L2-3, L3-
4 and L4-5.  There is straightening and slight kyphotic curvature
in this region.  With standing flexion and extension, no
subluxation occurs.
IMPRESSION: L2-3:  Anterior extradural defect.  Mild narrowing of both lateral
recesses.

L3-4:  Circumferential narrowing of the thecal sac.  Stenosis of
both lateral recesses.

L4-5:  Circumferential narrowing of the thecal sac.  Stenosis of
both lateral recesses.

L5-S1:  Poor filling of the S1 root sleeves.

CT MYELOGRAPHY LUMBAR SPINE
FINDINGS: T11-12:  Normal interspace.

T12-L1:  Normal interspace.  Conus tip at upper L1.

L1-2:  Minimal disc bulge.  Mild facet ligamentous hypertrophy.  No
compressive narrowing of the canal or foramina.

L2-3:  Moderate circumferential disc bulge.  Bilateral facet and
ligamentous hypertrophy.  Stenosis of the lateral recesses without
definite neural compression.

L3-4:  Circumferential protrusion of disc material.  Mild facet and
ligamentous hypertrophy.  Retrolisthesis of 2 mm.  Stenosis of the
lateral recesses that could cause neural compression on either or
both sides.

L4-5:  Broad-based herniation of disc material with caudal
migration worse on the right than the left.  Retrolisthesis of 2
mm.  Facet and ligamentous hypertrophy.  Stenosis of the lateral
recesses that could cause neural compression on either or both
sides.  This appears somewhat more severe on the right.

L5-S1:  Mild non compressive disc bulge. There is facet
degeneration bilaterally.  There is a calcification projecting
medially from the facet on the right that crowds the right S1 nerve
root but does not grossly compress it.
IMPRESSION: L2-3:  Disc bulge. Bilateral facet and ligamentous prominence.
Narrowing of both lateral recesses but without definite neural
compression.

L3-4:  Circumferential disc protrusion.  Facet and ligamentous
hypertrophy.  Stenosis of both lateral recesses that could cause
neural compression.

L4-5:  Broad-based disc herniation with caudal down turning,
slightly more prominent on the right.  Stenosis of the lateral
recesses that could cause neural compression on either or both
sides.

L5-S1:  Disc bulge.  Bilateral facet degeneration.  Calcification
projecting inward from the facet on the right crowds the right S1
nerve root but does not definitely compress it.

## 2011-08-24 MED ORDER — HYDROCODONE-ACETAMINOPHEN 5-325 MG PO TABS
ORAL_TABLET | ORAL | Status: AC
  Filled 2011-08-24: qty 2

## 2011-08-24 MED ORDER — ONDANSETRON HCL 4 MG/2ML IJ SOLN: 4.0000 mg | Freq: Four times a day (QID) | INTRAMUSCULAR | Status: DC | PRN

## 2011-08-24 MED ORDER — IOHEXOL 180 MG/ML  SOLN
12.0000 mL | Freq: Once | INTRAMUSCULAR | Status: AC | PRN
  Administered 2011-08-24: 12 mL via INTRATHECAL

## 2011-08-24 MED ORDER — HYDROCODONE-ACETAMINOPHEN 5-325 MG PO TABS
2.0000 | ORAL_TABLET | Freq: Once | ORAL | Status: AC
  Administered 2011-08-24: 2 via ORAL

## 2011-08-24 MED ORDER — DIAZEPAM 5 MG PO TABS
10.0000 mg | ORAL_TABLET | Freq: Once | ORAL | Status: AC
  Administered 2011-08-24: 10 mg via ORAL
  Filled 2011-08-24: qty 2

## 2011-08-24 NOTE — Procedures (Signed)
Pre op WU:[id number] Spondylosis Post op YN:WGNF Procedure:Lumbar myelogram Surgeon:Rusty Villella Puncture level:L2-3 Fluid color:clear Injection:12 cc iohexol 180 Findings:spondylosis stenosis l3-4 L4-5

## 2011-08-24 NOTE — Procedures (Signed)
Cynthia Padilla  #[id number] DOB:  03-Feb-1951 07/20/2011:     Cynthia Padilla returns to the office today for further followup regarding her back and right buttock pain and weakness in the foot.  She has had persistent symptoms despite the passage of time.  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    I have advised that ultimately she may require some surgical intervention for this process but prior to doing so she would require a myelogram and post myelogram CT scan.  We will schedule this at the earliest possible convenience.           Stefani Dama, M.D./gde  - NEUROSURGICAL CONSULTATION Cynthia Padilla #[id number] DOB: 1951/01/30 February 05, 2010 CHIEF COMPLAINT: the foot. 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 She notes that she had a work-related incident where she fell about a month ago and developed significant back and right-sided leg pain. She was seen at [facility] and was diagnosed with herniated disks at the levels of L4 and L5. She has been treated by Dr. Venita Lick, who suggested a trial of selective nerve block for the L4-L5 space. This has not been performed at this time. She notes that she has been having significant problems with pain in the right leg particularly as the worst singular symptom. She has not been aware of any particular weakness in her legs, however. The pain has been persisting and does not seem to be getting better spontaneously or with the passage of time, and she is seen now for a second opinion regarding treatment. PAST MEDICAL HISTORY: her general health has been fairly good. PAST SURGICAL HISTORY: CURRENT MEDICATIONS: aspirin. PERSONAL AND SOCIAL HISTORY: basis. REVIEW OF SYSTEMS: while walking. Reveals that she has some hypertension; otherwise, She has had some minor surgeries in the  past. Include hydrochlorothiazide, Nexium, and baby She does not smoke. She uses alcohol on social Notable for the high blood pressure and leg pain PHYSICAL EXAM1NATION: Height and weight have been stable at 5 feet 7.5 inches and 220 pounds. She stands straight and exhibits some difficulty from motor strength reveals that she has weakness in tibialis anterior group on the right leg. She also has weakness in the extensor hallucis longus on that right side. She does walk with a modestly antalgic gait.  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 On top of this, there is evidence of a disk protrusion centrally and to the right side. This does appear to have the characteristics of an acute disk herniation and does cause some foraminal stenosis on that right side. I indicated at this time it would be best to try to ameliorate this with a selective nerve root block for the right L5 nerve root. This would be done via transforaminal block at L5-S1. If this is successfbl in ameliorating the worst of the pain, we can continue to treat this process conservatively. If not, then ultimately, Cynthia Padilla may need to consider surgical intervention. This would likely, in my opinion, require decompression and arthrodesis of the joint as there is significant spondylitic change in that joint, and I do not believe that a simple diskectomy is likely to provide adequate relief of the L5 nerve root. In order to better determine the nature of  surgery for her, I would suggest a myelogram with post myelogram CAT scan before surgery is contemplated. We will proceed with a transforaminal block if she so desires; this can be done here if the patient wishes to have it done here. ADDENDUM: We discussed pain medications. At this point, Cynthia Padilla has been using hydrocodone for pain control, but she finds that it makes her  nauseous, and it is somewhat sedating. I will provide her a prescription for some tramadol to see if this helps control the pain in the interim. [facility] Nahia Nissan JaM.D., F.A.C.S.

## 2011-09-22 ENCOUNTER — Other Ambulatory Visit: Payer: Self-pay | Admitting: Neurological Surgery

## 2011-09-26 ENCOUNTER — Encounter (HOSPITAL_COMMUNITY): Payer: Self-pay | Admitting: Pharmacy Technician

## 2011-10-03 ENCOUNTER — Encounter (HOSPITAL_COMMUNITY): Payer: Self-pay

## 2011-10-03 ENCOUNTER — Other Ambulatory Visit: Payer: Self-pay

## 2011-10-03 ENCOUNTER — Encounter (HOSPITAL_COMMUNITY)
Admission: RE | Admit: 2011-10-03 | Discharge: 2011-10-03 | Disposition: A | Discharge status: Home / Self Care | Payer: BC Managed Care – PPO | Source: Ambulatory Visit | Attending: Neurological Surgery | Admitting: Neurological Surgery

## 2011-10-03 ENCOUNTER — Ambulatory Visit (HOSPITAL_COMMUNITY)
Admission: RE | Admit: 2011-10-03 | Discharge: 2011-10-03 | Disposition: A | Discharge status: Home / Self Care | Payer: BC Managed Care – PPO | Source: Ambulatory Visit | Attending: Anesthesiology | Admitting: Anesthesiology

## 2011-10-03 ENCOUNTER — Other Ambulatory Visit: Payer: Self-pay | Admitting: Neurological Surgery

## 2011-10-03 DIAGNOSIS — Z01812 Encounter for preprocedural laboratory examination: Secondary | ICD-10-CM | POA: Insufficient documentation

## 2011-10-03 DIAGNOSIS — E559 Vitamin D deficiency, unspecified: Secondary | ICD-10-CM

## 2011-10-03 DIAGNOSIS — J984 Other disorders of lung: Secondary | ICD-10-CM | POA: Insufficient documentation

## 2011-10-03 DIAGNOSIS — Z01818 Encounter for other preprocedural examination: Secondary | ICD-10-CM | POA: Insufficient documentation

## 2011-10-03 HISTORY — DX: Anxiety disorder, unspecified: F41.9

## 2011-10-03 HISTORY — DX: Unspecified osteoarthritis, unspecified site: M19.90

## 2011-10-03 HISTORY — DX: Other specified postprocedural states: R11.2

## 2011-10-03 HISTORY — DX: Inflammatory liver disease, unspecified: K75.9

## 2011-10-03 HISTORY — DX: Other specified postprocedural states: Z98.890

## 2011-10-03 LAB — CBC
Hemoglobin: 14.4 g/dL (ref 12.0–15.0)
MCH: 30.8 pg (ref 26.0–34.0)
MCHC: 33.5 g/dL (ref 30.0–36.0)
Platelets: 268 10*3/uL (ref 150–400)
RDW: 13 % (ref 11.5–15.5)

## 2011-10-03 LAB — COMPREHENSIVE METABOLIC PANEL
ALT: 23 U/L (ref 0–35)
AST: 26 U/L (ref 0–37)
Alkaline Phosphatase: 73 U/L (ref 39–117)
CO2: 29 mEq/L (ref 19–32)
Chloride: 102 mEq/L (ref 96–112)
GFR calc non Af Amer: 74 mL/min — ABNORMAL LOW (ref >90)
Potassium: 4 mEq/L (ref 3.5–5.1)
Sodium: 140 mEq/L (ref 135–145)
Total Bilirubin: 0.4 mg/dL (ref 0.3–1.2)

## 2011-10-03 LAB — TYPE AND SCREEN

## 2011-10-03 LAB — ABO/RH: ABO/RH(D): O POS

## 2011-10-03 IMAGING — CR DG CHEST 2V
2 series · 2 of 2 positions shown · non-contrast
Comparison: None

CLINICAL DATA: Preop for lumbar surgery.

CHEST - 2 VIEW

[view not recorded (1 of 2)]
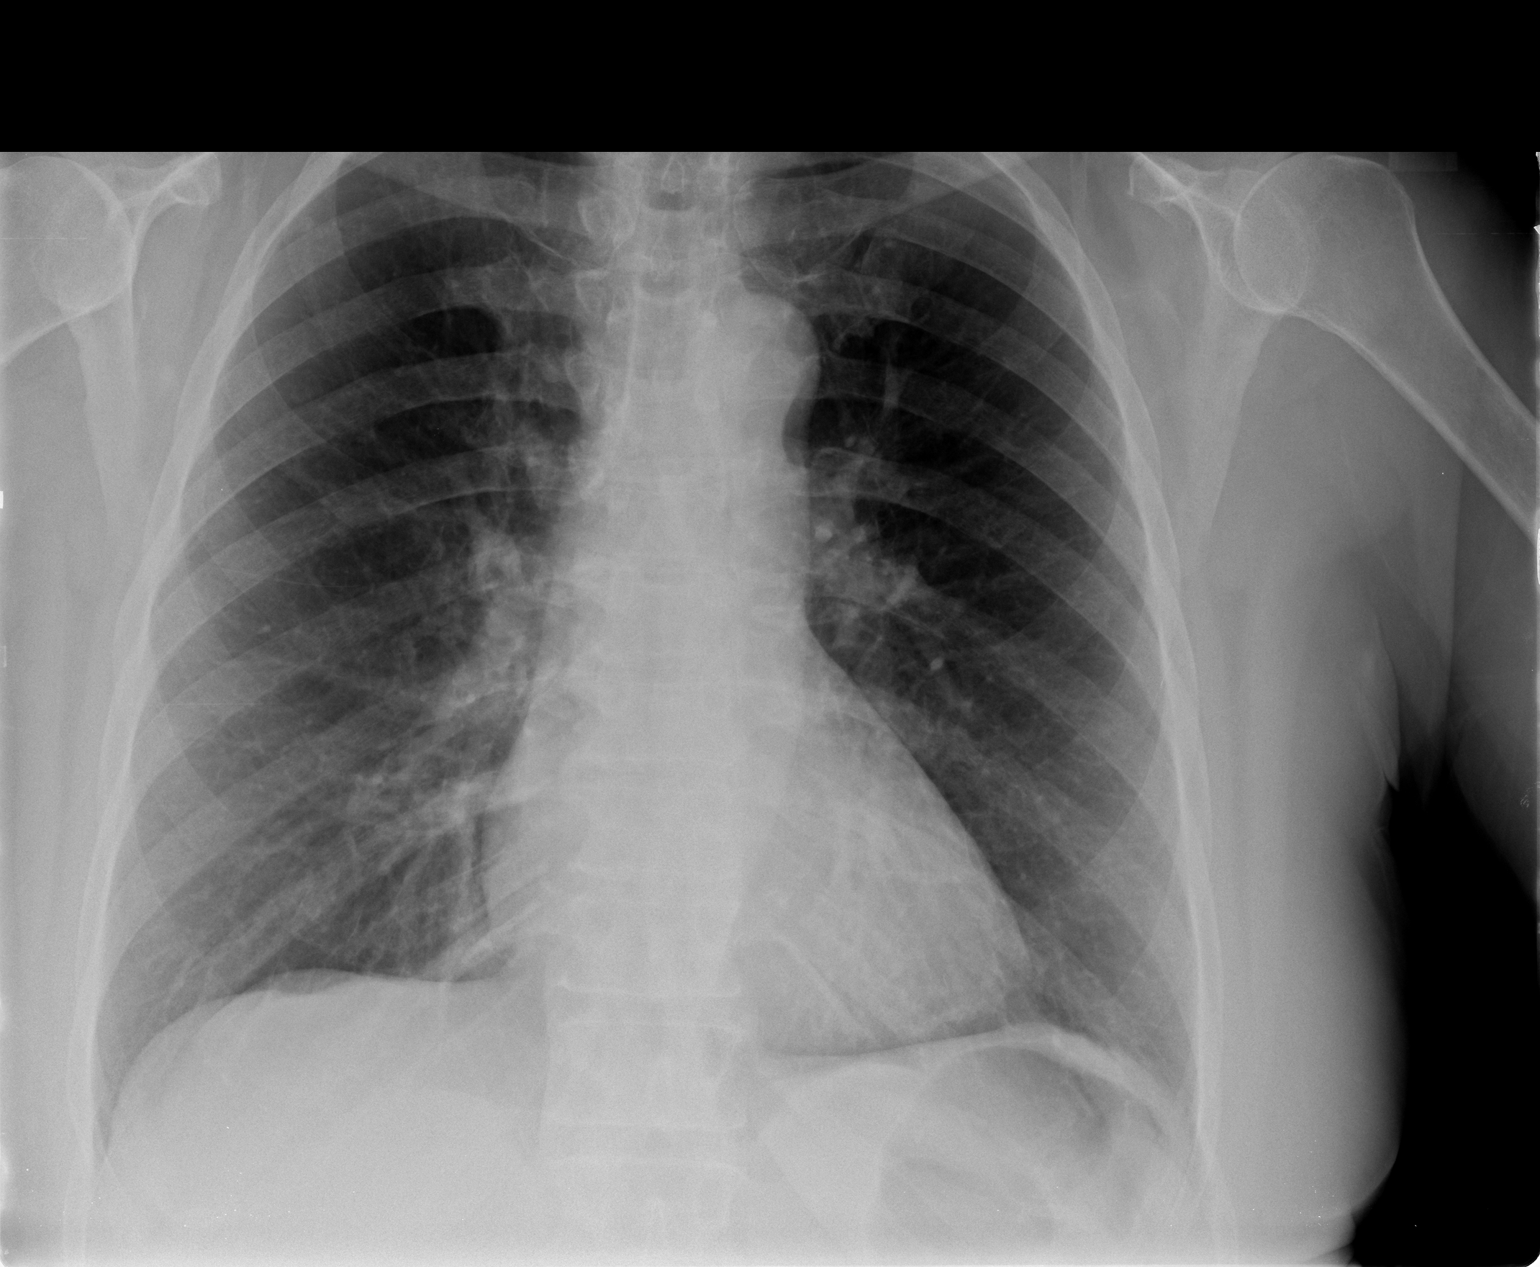

[view not recorded (2 of 2)]
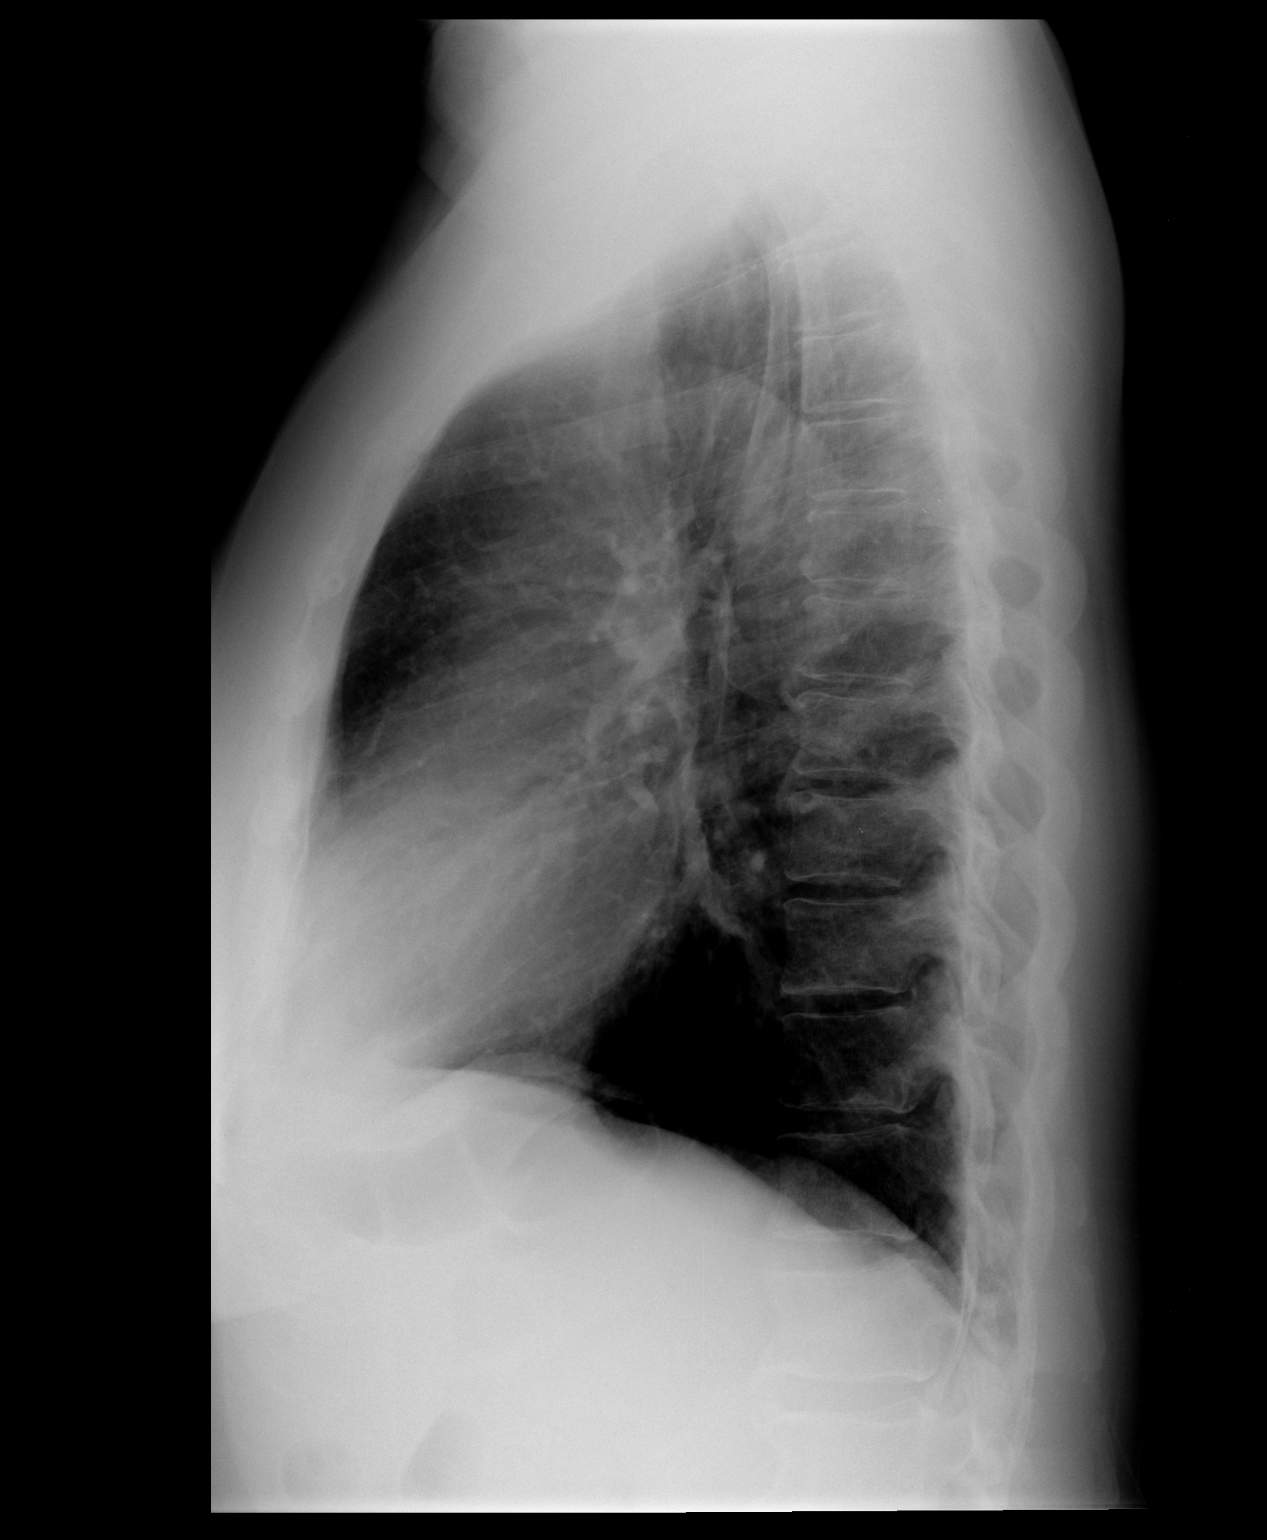

[2 of 2 positions shown; findings below may reference images not displayed]

FINDINGS: The cardiac silhouette, mediastinal and hilar contours
are within normal limits.  The lungs are clear of acute process.
Two small nodular densities are noted in the right upper lobe.
This is an indeterminate finding without prior examinations.  Chest
CT is suggested for further evaluation.  No pleural effusion.  The
bony thorax is intact.
IMPRESSION: 1.  No acute cardiopulmonary findings.
2.  Two small nodular densities in the right upper lobe.  Recommend
chest CT for further evaluation.

This was made a call report.

## 2011-10-03 NOTE — Progress Notes (Signed)
Chart to Anesthesia to review for CXR.

## 2011-10-03 NOTE — Progress Notes (Signed)
Patient states she had stress test more than three years ago just routine and it was negative.

## 2011-10-03 NOTE — Progress Notes (Signed)
Anesthesiology note:  Cynthia Padilla's chest x-ray report was reviewed there 2 small nodular densities in the right upper lobe. The radiologist recommended chest CT for further evaluation. Dr. Danielle Dess will be notified.  Kipp Brood M.D.

## 2011-10-03 NOTE — Pre-Procedure Instructions (Signed)
20 Cynthia Padilla  10/03/2011   Your procedure is scheduled on:  10/11/2011  Report to [facility] at 5:30 AM.  Call this number if you have problems the morning of surgery: [phone]   Remember: Discontinue Aspirin, Coumadin, Effient, Plavix and herbal medications.   Do not eat food:After Midnight.  May have clear liquids: up to 4 Hours before arrival (1:30 AM).  Clear liquids include soda, tea, black coffee, apple or grape juice, broth.  Take these medicines the morning of surgery with A SIP OF WATER: Nexium, Norco or Ultram for pain if needed.   Do not wear jewelry, make-up or nail polish.  Do not wear lotions, powders, or perfumes. You may wear deodorant.  Do not shave 48 hours prior to surgery.  Do not bring valuables to the hospital.  Contacts, dentures or bridgework may not be worn into surgery.  Leave suitcase in the car. After surgery it may be brought to your room.  For patients admitted to the hospital, checkout time is 11:00 AM the day of discharge.   Patients discharged the day of surgery will not be allowed to drive home.  Name and phone number of your driver: Being admitted.  Special Instructions: CHG Shower Use Special Wash: 1/2 bottle night before surgery and 1/2 bottle morning of surgery.   Please read over the following fact sheets that you were given: Pain Booklet, Coughing and Deep Breathing, Blood Transfusion Information, MRSA Information and Surgical Site Infection Prevention

## 2011-10-10 MED ORDER — CEFAZOLIN SODIUM-DEXTROSE 2-3 GM-% IV SOLR
2.0000 g | INTRAVENOUS | Status: AC
  Administered 2011-10-11: 2 g via INTRAVENOUS
  Filled 2011-10-10: qty 50

## 2011-10-11 ENCOUNTER — Encounter (HOSPITAL_COMMUNITY): Payer: Self-pay | Admitting: Anesthesiology

## 2011-10-11 ENCOUNTER — Inpatient Hospital Stay (HOSPITAL_COMMUNITY)
Admission: RE | Admit: 2011-10-11 | Discharge: 2011-10-13 | DRG: 460 | Disposition: A | Discharge status: Home / Self Care | Payer: Worker's Compensation | Source: Ambulatory Visit | Attending: Neurological Surgery | Admitting: Neurological Surgery

## 2011-10-11 ENCOUNTER — Encounter (HOSPITAL_COMMUNITY): Payer: Self-pay

## 2011-10-11 ENCOUNTER — Ambulatory Visit (HOSPITAL_COMMUNITY): Payer: BC Managed Care – PPO

## 2011-10-11 ENCOUNTER — Encounter (HOSPITAL_COMMUNITY)
Admission: RE | Disposition: A | Discharge status: Home / Self Care | Payer: Self-pay | Source: Ambulatory Visit | Attending: Neurological Surgery

## 2011-10-11 ENCOUNTER — Ambulatory Visit (HOSPITAL_COMMUNITY): Payer: BC Managed Care – PPO | Attending: Neurological Surgery

## 2011-10-11 ENCOUNTER — Ambulatory Visit (HOSPITAL_COMMUNITY): Payer: Worker's Compensation | Admitting: Anesthesiology

## 2011-10-11 DIAGNOSIS — M47817 Spondylosis without myelopathy or radiculopathy, lumbosacral region: Principal | ICD-10-CM | POA: Diagnosis present

## 2011-10-11 DIAGNOSIS — Z8719 Personal history of other diseases of the digestive system: Secondary | ICD-10-CM

## 2011-10-11 DIAGNOSIS — Z9181 History of falling: Secondary | ICD-10-CM

## 2011-10-11 DIAGNOSIS — I10 Essential (primary) hypertension: Secondary | ICD-10-CM | POA: Diagnosis present

## 2011-10-11 DIAGNOSIS — M47816 Spondylosis without myelopathy or radiculopathy, lumbar region: Secondary | ICD-10-CM

## 2011-10-11 DIAGNOSIS — Z860100 Personal history of colon polyps, unspecified: Secondary | ICD-10-CM

## 2011-10-11 DIAGNOSIS — Z7982 Long term (current) use of aspirin: Secondary | ICD-10-CM

## 2011-10-11 DIAGNOSIS — M48061 Spinal stenosis, lumbar region without neurogenic claudication: Secondary | ICD-10-CM | POA: Diagnosis present

## 2011-10-11 DIAGNOSIS — K219 Gastro-esophageal reflux disease without esophagitis: Secondary | ICD-10-CM | POA: Diagnosis present

## 2011-10-11 HISTORY — PX: LAMINECTOMY: SHX219

## 2011-10-11 IMAGING — RF DG LUMBAR SPINE 2-3V
1 series · 2 of 2 positions shown · non-contrast
Comparison: CT of the lumbar spine [DATE]

CLINICAL DATA: L4-L5 PLIF

LUMBAR SPINE - 2-3 VIEW

[Series 1: run · 2 of 2 slices shown]
[im 1/2]
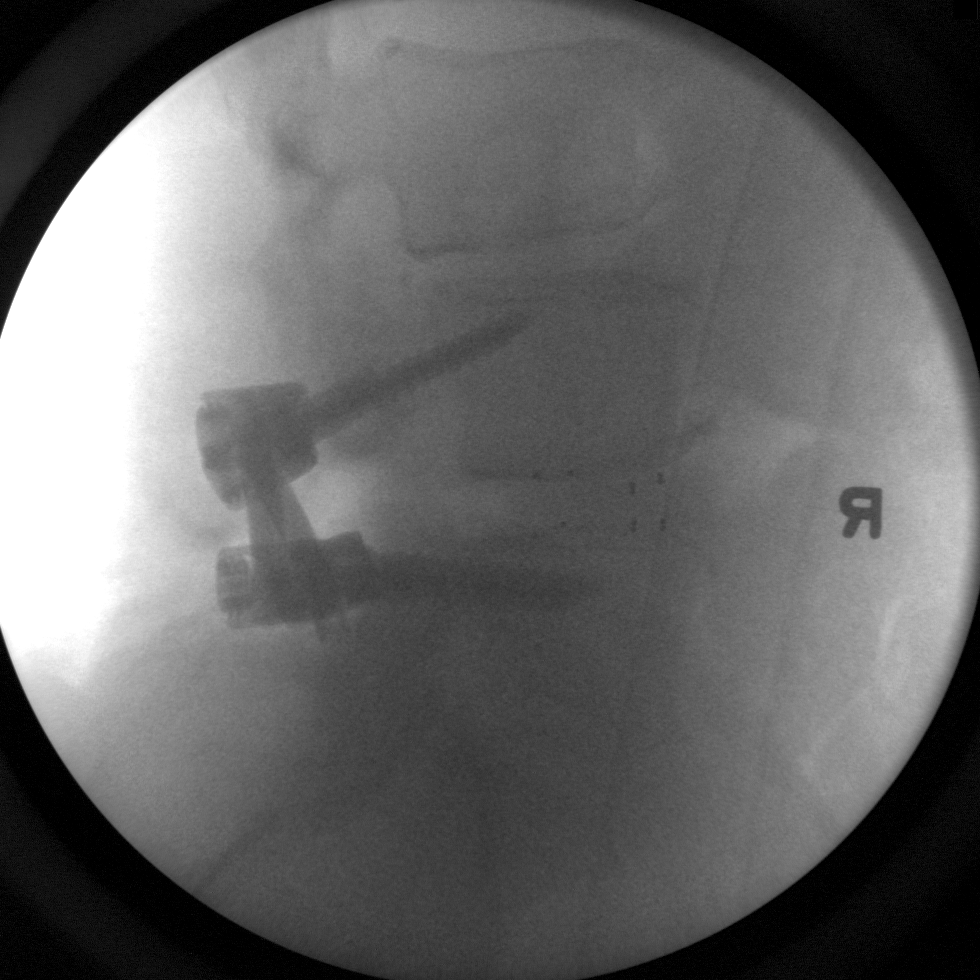
[im 2/2]
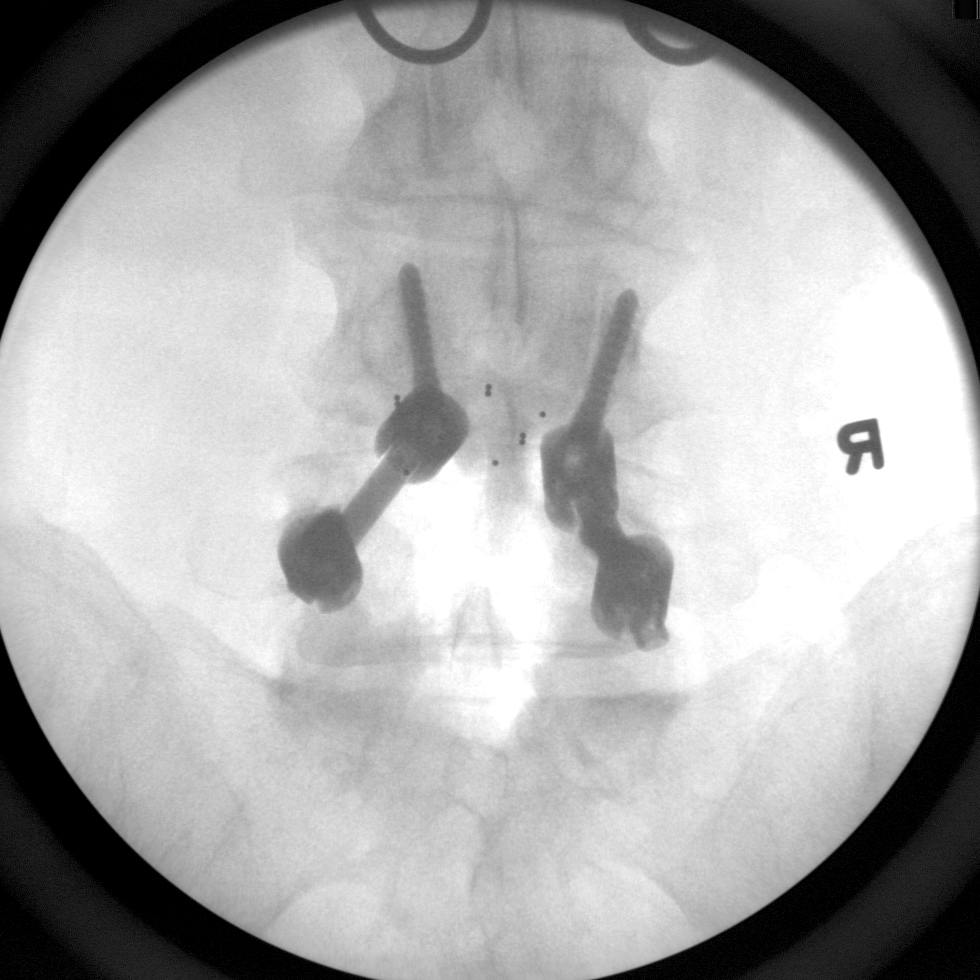

[2 of 2 positions shown; findings below may reference images not displayed]

IMPRESSION: Two intraoperative fluoroscopic spot views of the lower
lumbar spine are submitted for evaluation.  These demonstrate a
posterior rod and screw fixation device at L4-L5, with an interbody
graft at the L4-L5 interspace.

## 2011-10-11 IMAGING — CR DG LUMBAR SPINE 2-3V
1 series · 1 of 1 positions shown · non-contrast
Comparison: Myelogram [DATE].

CLINICAL DATA: Intraoperative localization.

LUMBAR SPINE - 2-3 VIEW

[view not recorded]
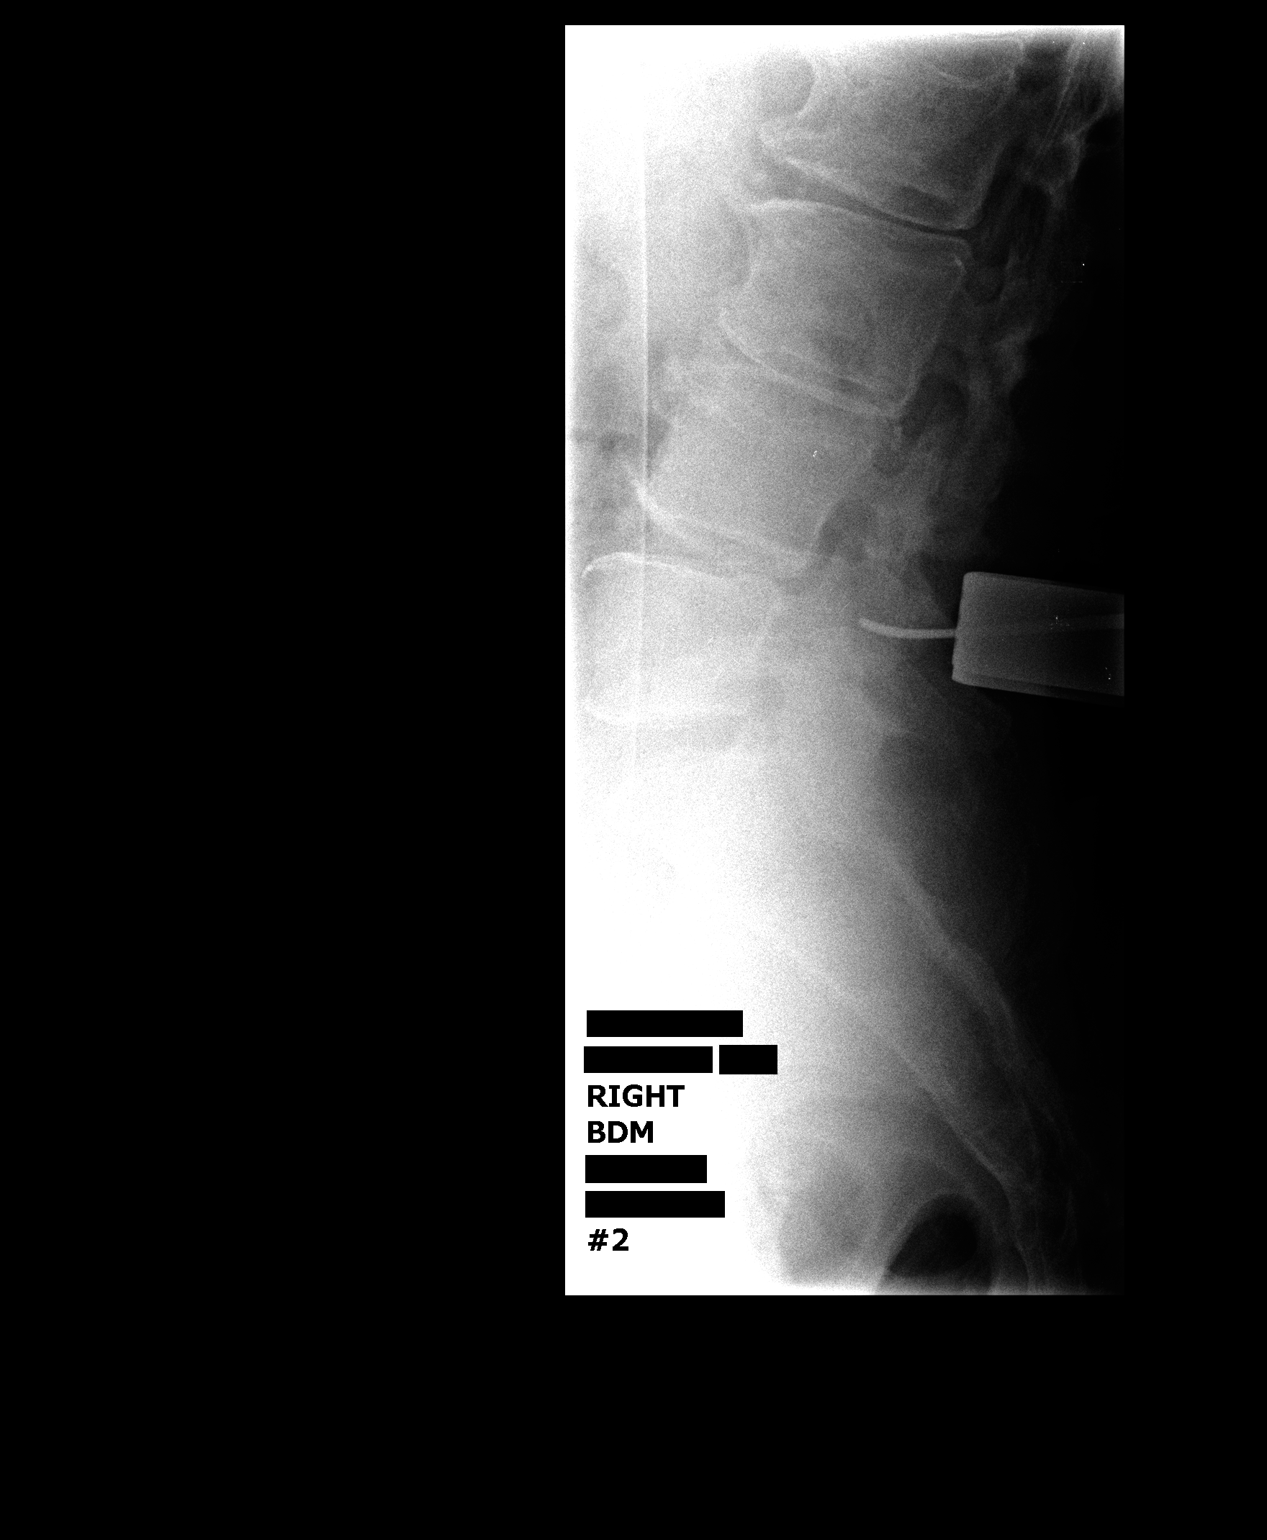

[1 of 1 positions shown; findings below may reference images not displayed]

FINDINGS: First cross-table lateral view of the lumbar spine
demonstrates posterior surgical instruments at the L5 and S1 level.

Second lateral intraoperative image demonstrates posterior surgical
instruments at the L4-5 level.
IMPRESSION: Intraoperative localization as above.

## 2011-10-11 IMAGING — CR DG LUMBAR SPINE 2-3V
1 series · 1 of 1 positions shown · non-contrast
Comparison: Myelogram [DATE].

CLINICAL DATA: Intraoperative localization.

LUMBAR SPINE - 2-3 VIEW

[view not recorded]
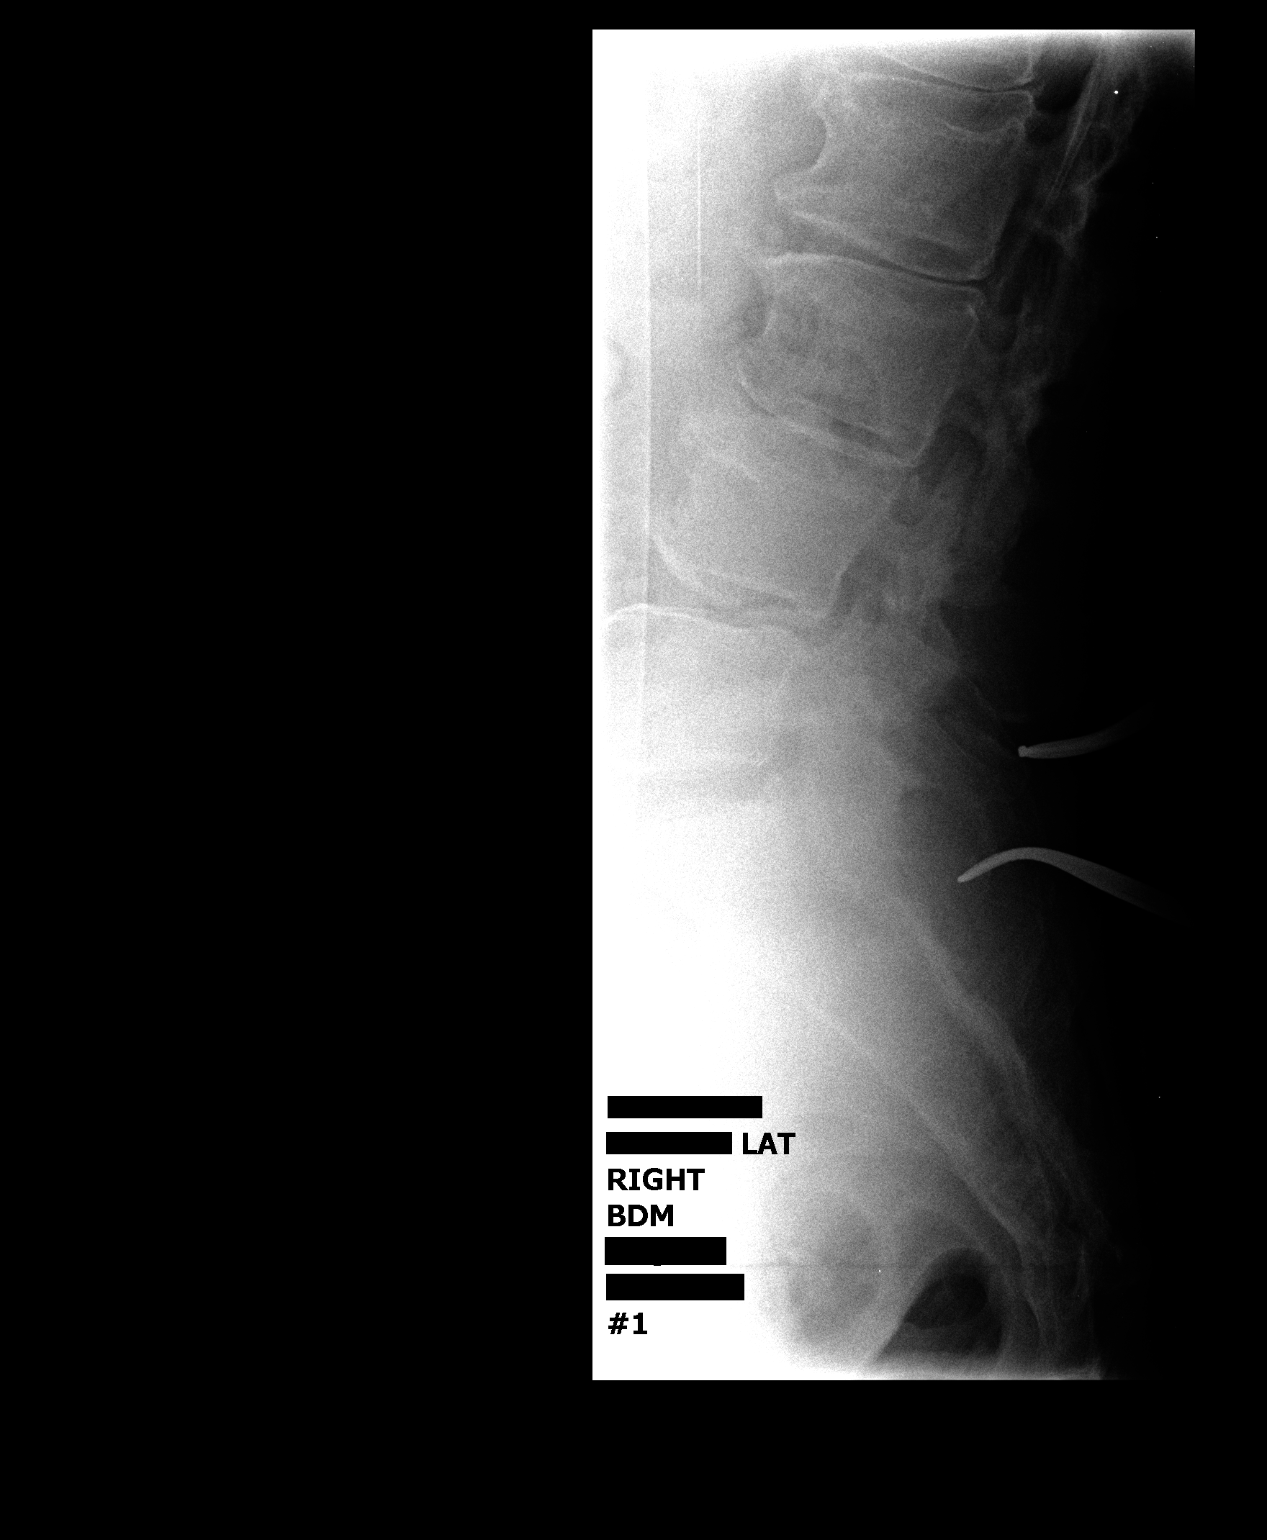

[1 of 1 positions shown; findings below may reference images not displayed]

FINDINGS: First cross-table lateral view of the lumbar spine
demonstrates posterior surgical instruments at the L5 and S1 level.

Second lateral intraoperative image demonstrates posterior surgical
instruments at the L4-5 level.
IMPRESSION: Intraoperative localization as above.

## 2011-10-11 SURGERY — POSTERIOR LUMBAR FUSION 1 LEVEL
Anesthesia: General | Site: Back | Wound class: Clean

## 2011-10-11 MED ORDER — GLYCOPYRROLATE 0.2 MG/ML IJ SOLN
INTRAMUSCULAR | Status: DC | PRN
  Administered 2011-10-11: .3 mg via INTRAVENOUS

## 2011-10-11 MED ORDER — HYDROMORPHONE HCL PF 1 MG/ML IJ SOLN
INTRAMUSCULAR | Status: AC
  Filled 2011-10-11: qty 1

## 2011-10-11 MED ORDER — ARTIFICIAL TEARS OP OINT
TOPICAL_OINTMENT | OPHTHALMIC | Status: DC | PRN
  Administered 2011-10-11: 1 via OPHTHALMIC

## 2011-10-11 MED ORDER — SODIUM CHLORIDE 0.9 % IJ SOLN: 3.0000 mL | INTRAMUSCULAR | Status: DC | PRN

## 2011-10-11 MED ORDER — ACETAMINOPHEN 10 MG/ML IV SOLN
INTRAVENOUS | Status: AC
  Administered 2011-10-11: 1000 mg via INTRAVENOUS
  Filled 2011-10-11: qty 100

## 2011-10-11 MED ORDER — MIDAZOLAM HCL 5 MG/5ML IJ SOLN
INTRAMUSCULAR | Status: DC | PRN
  Administered 2011-10-11: 2 mg via INTRAVENOUS

## 2011-10-11 MED ORDER — ACETAMINOPHEN 650 MG RE SUPP: 650.0000 mg | RECTAL | Status: DC | PRN

## 2011-10-11 MED ORDER — NEOSTIGMINE METHYLSULFATE 1 MG/ML IJ SOLN
INTRAMUSCULAR | Status: DC | PRN
  Administered 2011-10-11: 3 mg via INTRAVENOUS

## 2011-10-11 MED ORDER — LIDOCAINE-EPINEPHRINE 1 %-1:100000 IJ SOLN
INTRAMUSCULAR | Status: DC | PRN
  Administered 2011-10-11: 20 mL

## 2011-10-11 MED ORDER — HETASTARCH-ELECTROLYTES 6 % IV SOLN
INTRAVENOUS | Status: DC | PRN
  Administered 2011-10-11: 09:00:00 via INTRAVENOUS

## 2011-10-11 MED ORDER — LIDOCAINE HCL 4 % MT SOLN
OROMUCOSAL | Status: DC | PRN
  Administered 2011-10-11: 4 mL via TOPICAL

## 2011-10-11 MED ORDER — ACETAMINOPHEN 325 MG PO TABS
650.0000 mg | ORAL_TABLET | ORAL | Status: DC | PRN
  Administered 2011-10-13: 650 mg via ORAL
  Filled 2011-10-11: qty 2

## 2011-10-11 MED ORDER — DROPERIDOL 2.5 MG/ML IJ SOLN
INTRAMUSCULAR | Status: DC | PRN
  Administered 2011-10-11: 0.625 mg via INTRAVENOUS

## 2011-10-11 MED ORDER — LACTATED RINGERS IV SOLN: INTRAVENOUS | Status: DC

## 2011-10-11 MED ORDER — MORPHINE SULFATE 2 MG/ML IJ SOLN: 0.0500 mg/kg | INTRAMUSCULAR | Status: DC | PRN

## 2011-10-11 MED ORDER — SODIUM CHLORIDE 0.9 % IR SOLN
Status: DC | PRN
  Administered 2011-10-11: 10:00:00

## 2011-10-11 MED ORDER — SODIUM CHLORIDE 0.9 % IV SOLN: 250.0000 mL | INTRAVENOUS | Status: DC

## 2011-10-11 MED ORDER — PHENOL 1.4 % MT LIQD: 1.0000 | OROMUCOSAL | Status: DC | PRN

## 2011-10-11 MED ORDER — ROCURONIUM BROMIDE 100 MG/10ML IV SOLN
INTRAVENOUS | Status: DC | PRN
  Administered 2011-10-11: 50 mg via INTRAVENOUS

## 2011-10-11 MED ORDER — SCOPOLAMINE 1 MG/3DAYS TD PT72
MEDICATED_PATCH | TRANSDERMAL | Status: DC | PRN
  Administered 2011-10-11: 1 via TRANSDERMAL

## 2011-10-11 MED ORDER — PANTOPRAZOLE SODIUM 40 MG PO TBEC
40.0000 mg | DELAYED_RELEASE_TABLET | Freq: Every day | ORAL | Status: DC
  Administered 2011-10-12 – 2011-10-13 (×2): 40 mg via ORAL
  Filled 2011-10-11: qty 1

## 2011-10-11 MED ORDER — LACTATED RINGERS IV SOLN
INTRAVENOUS | Status: DC | PRN
  Administered 2011-10-11 (×3): via INTRAVENOUS

## 2011-10-11 MED ORDER — HYDROMORPHONE HCL PF 1 MG/ML IJ SOLN
0.2500 mg | INTRAMUSCULAR | Status: DC | PRN
  Administered 2011-10-11: 0.5 mg via INTRAVENOUS

## 2011-10-11 MED ORDER — SODIUM CHLORIDE 0.9 % IJ SOLN
3.0000 mL | Freq: Two times a day (BID) | INTRAMUSCULAR | Status: DC
  Administered 2011-10-11 – 2011-10-12 (×4): 3 mL via INTRAVENOUS

## 2011-10-11 MED ORDER — DIAZEPAM 5 MG PO TABS
5.0000 mg | ORAL_TABLET | Freq: Four times a day (QID) | ORAL | Status: DC | PRN
  Administered 2011-10-12: 5 mg via ORAL
  Filled 2011-10-11: qty 1

## 2011-10-11 MED ORDER — SODIUM CHLORIDE 0.9 % IV SOLN
INTRAVENOUS | Status: AC
  Filled 2011-10-11: qty 500

## 2011-10-11 MED ORDER — ONDANSETRON HCL 4 MG/2ML IJ SOLN
INTRAMUSCULAR | Status: DC | PRN
  Administered 2011-10-11 (×2): 4 mg via INTRAVENOUS

## 2011-10-11 MED ORDER — MORPHINE SULFATE 4 MG/ML IJ SOLN: 1.0000 mg | INTRAMUSCULAR | Status: DC | PRN

## 2011-10-11 MED ORDER — PROPOFOL 10 MG/ML IV EMUL
INTRAVENOUS | Status: DC | PRN
  Administered 2011-10-11: 180 mg via INTRAVENOUS
  Administered 2011-10-11: 20 mg via INTRAVENOUS

## 2011-10-11 MED ORDER — TRAMADOL HCL 50 MG PO TABS
50.0000 mg | ORAL_TABLET | Freq: Four times a day (QID) | ORAL | Status: DC | PRN
  Filled 2011-10-11: qty 1

## 2011-10-11 MED ORDER — 0.9 % SODIUM CHLORIDE (POUR BTL) OPTIME
TOPICAL | Status: DC | PRN
  Administered 2011-10-11: 1000 mL

## 2011-10-11 MED ORDER — BACITRACIN 50000 UNITS IM SOLR
INTRAMUSCULAR | Status: AC
  Filled 2011-10-11: qty 1

## 2011-10-11 MED ORDER — HYDROCHLOROTHIAZIDE 25 MG PO TABS
25.0000 mg | ORAL_TABLET | Freq: Every day | ORAL | Status: DC
  Administered 2011-10-12 – 2011-10-13 (×2): 25 mg via ORAL
  Filled 2011-10-11 (×2): qty 1

## 2011-10-11 MED ORDER — SODIUM CHLORIDE 0.9 % IV SOLN
INTRAVENOUS | Status: DC
  Administered 2011-10-11 (×2): via INTRAVENOUS

## 2011-10-11 MED ORDER — OXYCODONE-ACETAMINOPHEN 5-325 MG PO TABS
1.0000 | ORAL_TABLET | ORAL | Status: DC | PRN
  Administered 2011-10-11 – 2011-10-13 (×10): 1 via ORAL
  Filled 2011-10-11 (×4): qty 1
  Filled 2011-10-11: qty 2
  Filled 2011-10-11 (×5): qty 1

## 2011-10-11 MED ORDER — DEXAMETHASONE SODIUM PHOSPHATE 10 MG/ML IJ SOLN
INTRAMUSCULAR | Status: AC
  Administered 2011-10-11: 10 mg via INTRAVENOUS
  Filled 2011-10-11: qty 1

## 2011-10-11 MED ORDER — THROMBIN 20000 UNITS EX KIT
PACK | CUTANEOUS | Status: DC | PRN
  Administered 2011-10-11: 10:00:00 via TOPICAL

## 2011-10-11 MED ORDER — ALUM & MAG HYDROXIDE-SIMETH 200-200-20 MG/5ML PO SUSP: 30.0000 mL | Freq: Four times a day (QID) | ORAL | Status: DC | PRN

## 2011-10-11 MED ORDER — VECURONIUM BROMIDE 10 MG IV SOLR
INTRAVENOUS | Status: DC | PRN
  Administered 2011-10-11: 2 mg via INTRAVENOUS
  Administered 2011-10-11 (×2): 1 mg via INTRAVENOUS
  Administered 2011-10-11: 2 mg via INTRAVENOUS

## 2011-10-11 MED ORDER — ONDANSETRON HCL 4 MG/2ML IJ SOLN: 4.0000 mg | INTRAMUSCULAR | Status: DC | PRN

## 2011-10-11 MED ORDER — FENTANYL CITRATE 0.05 MG/ML IJ SOLN
INTRAMUSCULAR | Status: DC | PRN
  Administered 2011-10-11: 150 ug via INTRAVENOUS
  Administered 2011-10-11 (×2): 50 ug via INTRAVENOUS
  Administered 2011-10-11: 100 ug via INTRAVENOUS

## 2011-10-11 MED ORDER — MENTHOL 3 MG MT LOZG: 1.0000 | LOZENGE | OROMUCOSAL | Status: DC | PRN

## 2011-10-11 MED ORDER — HYDROMORPHONE HCL PF 1 MG/ML IJ SOLN: 0.2500 mg | INTRAMUSCULAR | Status: DC | PRN

## 2011-10-11 MED ORDER — BUPIVACAINE HCL (PF) 0.5 % IJ SOLN
INTRAMUSCULAR | Status: DC | PRN
  Administered 2011-10-11 (×2): 20 mL

## 2011-10-11 SURGICAL SUPPLY — 64 items
ADH SKN CLS APL DERMABOND .7 (GAUZE/BANDAGES/DRESSINGS) ×1
BAG DECANTER FOR FLEXI CONT (MISCELLANEOUS) ×2 IMPLANT
BLADE SURG ROTATE 9660 (MISCELLANEOUS) IMPLANT
BUR MATCHSTICK NEURO 3.0 LAGG (BURR) ×2 IMPLANT
CANISTER SUCTION 2500CC (MISCELLANEOUS) ×2 IMPLANT
CLOTH BEACON ORANGE TIMEOUT ST (SAFETY) ×2 IMPLANT
CONT SPEC 4OZ CLIKSEAL STRL BL (MISCELLANEOUS) ×4 IMPLANT
COVER BACK TABLE 24X17X13 BIG (DRAPES) IMPLANT
COVER TABLE BACK 60X90 (DRAPES) ×2 IMPLANT
DECANTER SPIKE VIAL GLASS SM (MISCELLANEOUS) ×2 IMPLANT
DERMABOND ADVANCED (GAUZE/BANDAGES/DRESSINGS) ×1
DERMABOND ADVANCED .7 DNX12 (GAUZE/BANDAGES/DRESSINGS) ×1 IMPLANT
DRAPE C-ARM 42X72 X-RAY (DRAPES) ×4 IMPLANT
DRAPE LAPAROTOMY 100X72X124 (DRAPES) ×2 IMPLANT
DRAPE MICROSCOPE ZEISS OPMI (DRAPES) ×1 IMPLANT
DRAPE POUCH INSTRU U-SHP 10X18 (DRAPES) ×2 IMPLANT
DRAPE PROXIMA HALF (DRAPES) ×1 IMPLANT
DURAPREP 26ML APPLICATOR (WOUND CARE) ×2 IMPLANT
DURASEAL 3ML ×1 IMPLANT
ELECT REM PT RETURN 9FT ADLT (ELECTROSURGICAL) ×2
ELECTRODE REM PT RTRN 9FT ADLT (ELECTROSURGICAL) ×1 IMPLANT
GAUZE SPONGE 4X4 16PLY XRAY LF (GAUZE/BANDAGES/DRESSINGS) ×1 IMPLANT
GLOVE BIOGEL PI IND STRL 7.0 (GLOVE) IMPLANT
GLOVE BIOGEL PI IND STRL 8.5 (GLOVE) ×2 IMPLANT
GLOVE BIOGEL PI INDICATOR 7.0 (GLOVE) ×1
GLOVE BIOGEL PI INDICATOR 8.5 (GLOVE) ×2
GLOVE ECLIPSE 8.5 STRL (GLOVE) ×4 IMPLANT
GLOVE EXAM NITRILE LRG STRL (GLOVE) IMPLANT
GLOVE EXAM NITRILE MD LF STRL (GLOVE) IMPLANT
GLOVE EXAM NITRILE XL STR (GLOVE) IMPLANT
GLOVE EXAM NITRILE XS STR PU (GLOVE) IMPLANT
GLOVE SURG SS PI 6.5 STRL IVOR (GLOVE) ×4 IMPLANT
GOWN BRE IMP SLV AUR LG STRL (GOWN DISPOSABLE) IMPLANT
GOWN BRE IMP SLV AUR XL STRL (GOWN DISPOSABLE) ×3 IMPLANT
GOWN STRL REIN 2XL LVL4 (GOWN DISPOSABLE) ×4 IMPLANT
KIT BASIN OR (CUSTOM PROCEDURE TRAY) ×2 IMPLANT
KIT ROOM TURNOVER OR (KITS) ×2 IMPLANT
MILL MEDIUM DISP (BLADE) ×1 IMPLANT
NEEDLE HYPO 22GX1.5 SAFETY (NEEDLE) ×2 IMPLANT
NS IRRIG 1000ML POUR BTL (IV SOLUTION) ×2 IMPLANT
PACK FOAM VITOSS 10CC (Orthopedic Implant) ×1 IMPLANT
PACK LAMINECTOMY NEURO (CUSTOM PROCEDURE TRAY) ×2 IMPLANT
PAD ARMBOARD 7.5X6 YLW CONV (MISCELLANEOUS) ×6 IMPLANT
PATTIES SURGICAL .5 X1 (DISPOSABLE) ×2 IMPLANT
PEEK PLIF NOVEL 9X25X12 (Peek) ×2 IMPLANT
ROD 45.5X40CM (Rod) ×2 IMPLANT
RUBBERBAND STERILE (MISCELLANEOUS) ×2 IMPLANT
SCREW 35MM (Screw) ×2 IMPLANT
SCREW 35MM PEDICLE (Screw) ×2 IMPLANT
SCREW SET SPINAL STD HEXALOBE (Screw) ×4 IMPLANT
SPONGE GAUZE 4X4 12PLY (GAUZE/BANDAGES/DRESSINGS) ×2 IMPLANT
SPONGE LAP 4X18 X RAY DECT (DISPOSABLE) IMPLANT
SPONGE SURGIFOAM ABS GEL 100 (HEMOSTASIS) ×2 IMPLANT
SUT PROLENE 6 0 BV (SUTURE) ×1 IMPLANT
SUT VIC AB 1 CT1 18XBRD ANBCTR (SUTURE) ×1 IMPLANT
SUT VIC AB 1 CT1 8-18 (SUTURE) ×2
SUT VIC AB 2-0 CP2 18 (SUTURE) ×2 IMPLANT
SUT VIC AB 3-0 SH 8-18 (SUTURE) ×2 IMPLANT
SYR 20ML ECCENTRIC (SYRINGE) ×2 IMPLANT
TOWEL OR 17X24 6PK STRL BLUE (TOWEL DISPOSABLE) ×2 IMPLANT
TOWEL OR 17X26 10 PK STRL BLUE (TOWEL DISPOSABLE) ×2 IMPLANT
TRAP SPECIMEN MUCOUS 40CC (MISCELLANEOUS) ×2 IMPLANT
TRAY FOLEY CATH 14FRSI W/METER (CATHETERS) ×2 IMPLANT
WATER STERILE IRR 1000ML POUR (IV SOLUTION) ×2 IMPLANT

## 2011-10-11 NOTE — Preoperative (Signed)
Beta Blockers   Reason not to administer Beta Blockers:Not Applicable 

## 2011-10-11 NOTE — Anesthesia Postprocedure Evaluation (Signed)
  Anesthesia Post-op Note  Patient: Cynthia Padilla  Procedure(s) Performed: Procedure(s) (LRB): POSTERIOR LUMBAR FUSION 1 LEVEL (N/A)  Patient Location: PACU  Anesthesia Type: General  Level of Consciousness: awake  Airway and Oxygen Therapy: Patient Spontanous Breathing  Post-op Pain: mild  Post-op Assessment: Post-op Vital signs reviewed  Post-op Vital Signs: stable  Complications: No apparent anesthesia complications

## 2011-10-11 NOTE — Anesthesia Preprocedure Evaluation (Addendum)
Anesthesia Evaluation  Patient identified by MRN, date of birth, ID band Patient awake    Reviewed: Allergy & Precautions, H&P , NPO status , Patient's Chart, lab work & pertinent test results  History of Anesthesia Complications (+) PONV  Airway Mallampati: II TM Distance: >3 FB Neck ROM: Full    Dental  (+) Teeth Intact and Dental Advisory Given   Pulmonary asthma (denies asthma--related to cold x18 mths) ,  RUL nodule x2 on CXR breath sounds clear to auscultation CXR findings noted. Patient stated that she gotr CT Lung last week for follow up and told it was calcifications.Lungs CTA. Pt will follow up with Dr. Cato Mulligan as well. Patient states that Dr. Danielle Dess will follow up with Dr. Cato Mulligan as well per recent discussions. CE        Cardiovascular Exercise Tolerance: Good hypertension, Pt. on medications Rhythm:Regular Rate:Normal     Neuro/Psych PSYCHIATRIC DISORDERS Anxiety  Neuromuscular disease (pain, weakness, numbness RLE)    GI/Hepatic GERD-  Medicated and Controlled,(+) Hepatitis -, Unspecified  Endo/Other  negative endocrine ROS  Renal/GU negative Renal ROS  negative genitourinary   Musculoskeletal  (+) Arthritis - (pt c/o left shoulder pain, does not radiate. Denies CP/SOB.), Osteoarthritis,    Abdominal   Peds negative pediatric ROS (+)  Hematology negative hematology ROS (+)   Anesthesia Other Findings   Reproductive/Obstetrics negative OB ROS                       Anesthesia Physical Anesthesia Plan  ASA: III  Anesthesia Plan: General   Post-op Pain Management:    Induction: Intravenous  Airway Management Planned:   Additional Equipment:   Intra-op Plan:   Post-operative Plan: Extubation in OR  Informed Consent: I have reviewed the patients History and Physical, chart, labs and discussed the procedure including the risks, benefits and alternatives for the proposed  anesthesia with the patient or authorized representative who has indicated his/her understanding and acceptance.     Plan Discussed with: CRNA  Anesthesia Plan Comments:         Anesthesia Quick Evaluation

## 2011-10-11 NOTE — Anesthesia Procedure Notes (Signed)
Procedure Name: Intubation Date/Time: 10/11/2011 8:09 AM Performed by: Adria Dill Pre-anesthesia Checklist: Patient identified Patient Re-evaluated:Patient Re-evaluated prior to inductionOxygen Delivery Method: Circle system utilized Preoxygenation: Pre-oxygenation with 100% oxygen Intubation Type: IV induction and Cricoid Pressure applied Ventilation: Mask ventilation without difficulty Laryngoscope Size: Miller and 3 Grade View: Grade I Tube type: Oral Tube size: 7.5 mm Number of attempts: 1 Airway Equipment and Method: Stylet and LTA kit utilized Placement Confirmation: ETT inserted through vocal cords under direct vision,  positive ETCO2 and breath sounds checked- equal and bilateral Secured at: 21 cm Tube secured with: Tape Dental Injury: Teeth and Oropharynx as per pre-operative assessment

## 2011-10-11 NOTE — Op Note (Signed)
Procedure: L4-L5 decompressive laminectomy decompression of L4 and L5 nerve roots, posterior lumbar interbody arthrodesis with peek spacers local autograft and allograft, pedicle screw fixation L4-L5, posterior lateral arthrodesis L4-L5  Surgeon: Barnett Abu M.D.  Asst.: Tressie Stalker M.D.  Indications: Patient is Cynthia Padilla is a 61 y.o. female who who's had significant back pain and lumbar radiculopathy for over a years period time. A lumbar myelogram demonstrates advanced spondyl0sis with high-grade canal stenosis. she was advised regarding surgical intervention.  Procedure: The patient was brought to the operating room supine on a stretcher. After the smooth induction of general endotracheal anesthesia she was turned prone and the back was prepped with alcohol and DuraPrep. The back was then draped sterilely. A midline incision was created and carried down to the lumbar dorsal fascia. A localizing radiograph identified the L4 and L5 spinous processes. A subligamentous dissection was created at L4 and L5 to expose the interlaminar space at L4 and L5 and the facet joints over the L4-L5 interspace. Laminotomies were were then created removing the entire inferior margin of the lamina of L4 including the inferior facet at the L4-L5 joint. The yellow ligament was taken up and the common dural tube was exposed along with the L4 nerve root superiorly, and the L5 nerve root inferiorly, the disc space was exposed and epidural veins in this region were cauterized and divided. The L4 nerve roots and the L5 nerve root were dissected with care taken to protect them. On the right side there was noted to be a small arachnoid pouch on the lateral aspect of the nerve root this was adherent to the epidural is believed to have preexisted the surgical intervention. Examination of this revealed that the arachnoid was densely attached to the dura and was not causing any spinal fluid leakage is therefore can't knotted  until the end of the case and was coded DuraSeal. Gelfoam was then placed over this defect. The disc space was opened and a combination of curettes and rongeurs was used to evacuate the disc space fully. The endplates were removed using sharp curettes. An interbody spacer was placed to distract the disc space while the contralateral discectomy was performed. When the entirety of the disc was removed and the endplates were prepared final sizing of the disc space was obtained 12 mm peek spacers were chosen and packed with autograft and allograft and placed into the interspace. The remainder of the interspace was packed with autograft and allograft. Pedicle entry sites were then chosen using fluoroscopic guidance and 5.5 x 30 mm screws were placed in L4 and 6.5 x 40 mm screws were placed in L5. The lateral gutters were decorticated and graft was packed in the posterolateral gutters between L4 and L5. Final radiographs were obtained after placing appropriately sized rods between the pedicle screws at L4-L5 and torquing these to the appropriate tension. The surgical site was inspected carefully to assure the L4 and L5 nerve roots were well decompressed, hemostasis was obtained, and the graft was well packed. Then the retractors were removed and the wound was closed with #1 Vicryl in the lumbar dorsal fascia 2-0 Vicryl in the subcutaneous tissue and 3-0 Vicryl subcuticularly. When he cc of half percent Marcaine was injected into the paraspinous musculature at the time of closure. Blood loss was estimated at 250 cc. The patient tolerated procedure well and was returned to the recovery room in stable condition.

## 2011-10-11 NOTE — Transfer of Care (Signed)
Immediate Anesthesia Transfer of Care Note  Patient: Cynthia Padilla  Procedure(s) Performed: Procedure(s) (LRB): POSTERIOR LUMBAR FUSION 1 LEVEL (N/A)  Patient Location: PACU  Anesthesia Type: General  Level of Consciousness: awake, alert , oriented and patient cooperative  Airway & Oxygen Therapy: Patient Spontanous Breathing and Patient connected to nasal cannula oxygen  Post-op Assessment: Report given to PACU RN, Post -op Vital signs reviewed and stable and Patient moving all extremities X 4  Post vital signs: Reviewed and stable  Complications: No apparent anesthesia complications

## 2011-10-11 NOTE — H&P (Signed)
10/11/11 No change from 10/06/11. Cynthia Padilla  #[id number] DOB:  1964-04-02 10/06/2011:     Cynthia Padilla returns to the office today for some discussion regarding her upcoming surgery.  A preoperative chest x-ray showed 3 small nodular regions which were questionable in their nature.  A CT scan was performed and shows that at least two of these nodules are calcific densities mostly consistent with granulomata.  The third lesion is likely also a granuloma but not calcified.  There is no adenopathy.  There are no other lesions or perilesional edema to suggest a more intense pulmonic process and certainly nothing to suggest a cancer.  I am reassured that this is a stable process.  A CT followup in six months is suggested to check on the noncalcified lesion and we can order this down the road.  Nonetheless the index of suspicion for a cancerous process is exceedingly low.   We then reviewed the lumbar myelogram. I discussed the process of stenosis that Cynthia Padilla has in her back at L2-3, 3-4 and 4-5.  Cynthia Padilla tells me she has not been plagued by significant back pain.  The biggest problem has been the onset of her radicular pain in that right lower extremity.  This has been the singular most overwhelming problem for her to overcome.  I discussed previously that if surgery is considered we will do all three levels L2-3, 3-4 and 4-5 to decompress and stabilize them.  However, she is concerned about the extent of surgery and I did discuss with her the ramifications of multilevel fusion that is some increased stiffness, decreased mobility, increased length of time to heal, and the potential for adjacent level disease which exists with any surgery.    In reviewing her myelogram and post myelogram CT scan, the only singular nerve root that can be identified as being compressed is that of the L4-5 region.  This is both for the L4 exit foramen superiorly and the L5 exit foramen inferiorly.  If we want to minimize the surgical intervention we  could focus just on the L4-5 level which would relieve the worst of her radicular symptoms.  The concern would be whether there would be any advancement in the degenerative process at L2-3 and 3-4 in the near future that could result in the need for further surgery early down the road.  This is a relative risk and noting that condition appears stable with a calcified disc herniation at L3-4, I believe it is reasonable to simply decompress L4-5 the site of her symptoms, and stabilize just that joint alone.  This would lessen the surgical time and hopefully speed her recovery and should give her good relief of the symptoms that she is experiencing.    I discussed this in some length today and after some further consideration we will plan on doing a single level decompression and fusion at L4-5.            Cynthia Padilla, M.D./gde NEUROSIJRGICAL CONSULTATION Cynthia Padilla #[id number] DOB: [birth year] July 1,2011 CHIEF COMPLAINT: the foot. 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 She notes that she had a work-related incident where she fell about a month ago and developed significant back and right-sided leg pain. 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 She has been treated by Dr. Venita Lick, who suggested a  trial of selective nerve block for the L4-L5 space. This has not been performed at this time. She notes that she has been having significant problems with pain in the right leg particularly as the worst singular symptom. She has not been aware of any particular weakness in her legs, however. The pain has been persisting and does not seem to be getting better spontaneously or with the passage of time, and she is seen now for a second opinion regarding treatment. PAST MEDICAL HISTORY: her general health has been fairly  good. PAST SURGICAL HISTORY: CURRENT MEDICATIONS: aspirin. PERSONAL AND SOCIAL HISTORY: basis. REVIEW OF SYSTEMS: while walking. Reveals that she has some hypertension; otherwise, She has had some minor surgeries in the past. Include hydrochlorothiazide, Nexium, andbaby She does not smoke. She uses alcohol on social Notable for the high blood pressure and leg pain PHYSICAL EXAMINATION: Height and weight have been stable at 5 feet 7.5 inches and 220 pounds. She stands straight and exhibits some difficulty from motor strength reveals that she has weakness in tibialis anterior group on the right leg. She also has weakness in the extensor hallucis longus on that right side. She does walk with a modestly antalgic gait. 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 On top of this, there is evidence of a disk protrusion centrally and to the right side. This does appear to have the charactcristics of an acute disk hemiation and does cause some foraininal stenosis on that right side. I indicated at this time it would be best to try to ameliorate this with a selective nerve root block for the right L5 nerve root. This would be done via transforaminal block at L5-S1. If this is successftil in ameliorating the worst of the pain, we can continue to treat this process conservatively. If not, then ultimately, Cynthia Padilla may need to consider surgical intervention. This would likely, in my opinion, require decompression and arthrodesis of the joint as there is significant spondylitic change in that joint, and I do not believe that a simple diskectomy is likely to provide adequate relief of the L5 nerve root. In order to better detennine the nature of surgezy for her, I would suggest a myelogram with post myelograin CAT scan before surgery is contemplated. We will proceed with a transforaminal block  if she so desires; this can be done here if the patient wishes to have it done here. ADDENDUM: We discussed pain medications. At this point, Cynthia Padilla has been using hydrocodone for pain control, but she finds that it makes her nauseous, and it is somewhat sedating. I will provide her a prescription for some tramadol to see if this helps control the pain in the interim. [facility] Cynthia Padilla, M.D F.A.C.S.

## 2011-10-11 NOTE — Progress Notes (Signed)
Patient ID: Cynthia Padilla, female   DOB: Jul 29, 1951, 61 y.o.   MRN: [id number] Alert oriented comfortable. Incision is clean and dry. Motor function in iliopsoas quadriceps tibialis anterior and gastrocs is normal. Sensation is normal. Foley catheter in place.

## 2011-10-12 NOTE — Progress Notes (Signed)
Pt has ambulated well without assistance today. She is able to don brace on & off independently. Rema Fendt, RN

## 2011-10-12 NOTE — Progress Notes (Signed)
Occupational Therapy Evaluation Patient Details Name: Cynthia Padilla MRN: [id number] DOB: 17-May-1951 Today's Date: 10/12/2011  Problem List:  Patient Active Problem List  Diagnoses  . HYPERLIPIDEMIA  . HYPERTENSION  . GERD  . DIVERTICULOSIS, COLON  . COLONIC POLYPS, HX OF  . Hyperglycemia    Past Medical History:  Past Medical History  Diagnosis Date  . History of colonic polyps   . Diverticulitis   . Hyperlipidemia   . Hypertension   . GERD (gastroesophageal reflux disease)   . PONV (postoperative nausea and vomiting)     states she gets deathly sick  . Asthma     had it once when she had episode of bronchitis about 10 yrs ago  . Arthritis     thinks she has some in her spine, bursitis in hips  . Anxiety   . Hepatitis     thinks she had Hepatits as a child   Past Surgical History:  Past Surgical History  Procedure Date  . Left elbow     fx- cast   . Tubal ligation     OT Assessment/Plan/Recommendation OT Assessment Clinical Impression Statement: Pt s/p posterior lumbar fusion (one level) thus affecting PLOF.  Pt will benefit from acute OT to address below problem list in prep for safe d/c home with husband.  OT Recommendation/Assessment: Patient will need skilled OT in the acute care venue OT Problem List: Decreased activity tolerance;Decreased knowledge of use of DME or AE;Decreased knowledge of precautions;Pain OT Therapy Diagnosis : Acute pain OT Plan OT Frequency: Min 2X/week OT Treatment/Interventions: Self-care/ADL training;DME and/or AE instruction;Therapeutic activities;Patient/family education OT Recommendation Follow Up Recommendations: Supervision - Intermittent Equipment Recommended: None recommended by OT;None recommended by PT Individuals Consulted Consulted and Agree with Results and Recommendations: Patient OT Goals Acute Rehab OT Goals OT Goal Formulation: With patient Time For Goal Achievement: 7 days ADL Goals Pt Will Perform Lower Body  Bathing: with modified independence;Sit to stand from chair;Sit to stand from bed;with adaptive equipment ADL Goal: Lower Body Bathing - Progress: Goal set today Pt Will Perform Lower Body Dressing: with modified independence;Sit to stand from chair;Sit to stand from bed;with adaptive equipment ADL Goal: Lower Body Dressing - Progress: Goal set today Pt Will Perform Tub/Shower Transfer: Shower transfer;Ambulation;Maintaining back safety precautions ADL Goal: Tub/Shower Transfer - Progress: Goal set today Miscellaneous OT Goals Miscellaneous OT Goal #1: Pt will don/doff back brace with mod I sitting EOB in prep for ADL activities. OT Goal: Miscellaneous Goal #1 - Progress: Goal set today Miscellaneous OT Goal #2: Pt will perform bed mobility with mod I in prep for EOB ADLs. OT Goal: Miscellaneous Goal #2 - Progress: Goal set today  OT Evaluation Precautions/Restrictions  Precautions Precautions: Back Precaution Booklet Issued: Yes (comment) (back precaution handout) Precaution Comments: reviewed precautions with patient Required Braces or Orthoses: Yes Spinal Brace: Lumbar corset;Applied in sitting position Restrictions Weight Bearing Restrictions: No Prior Functioning Home Living Lives With: Spouse Receives Help From: Family (husband 24 hours as needed) Type of Home: House Home Layout: Two level;Full bath on main level Alternate Level Stairs-Rails: Left Alternate Level Stairs-Number of Steps: 14 Home Access: Stairs to enter Entrance Stairs-Rails: Left Entrance Stairs-Number of Steps: 5 Bathroom Shower/Tub: Walk-in shower;Door [employer] Toilet: Standard Home Adaptive Equipment: Oncologist;Other (comment) (bed rail that can be placed on bed if needed) Prior Function Level of Independence: Independent with basic ADLs;Independent with homemaking with ambulation;Independent with gait;Independent with transfers Able to Take Stairs?: Yes Driving:  Yes Vocation:  Full time employment Vocation Requirements: HR manager ADL ADL Grooming: Performed;Wash/dry hands;Supervision/safety Where Assessed - Grooming: Standing at sink Lower Body Bathing: Simulated;Moderate assistance Lower Body Bathing Details (indicate cue type and reason): Pt unable to cross legs to reach feet due to pain. Where Assessed - Lower Body Bathing: Sit to stand from bed Lower Body Dressing: Simulated;Moderate assistance Lower Body Dressing Details (indicate cue type and reason): Pt unable to cross legs to reach feet due to pain. Where Assessed - Lower Body Dressing: Sit to stand from bed Toilet Transfer: Performed;Supervision/safety Toilet Transfer Method: Proofreader: Regular height toilet Toileting - Clothing Manipulation: Performed;Supervision/safety Toileting - Clothing Manipulation Details (indicate cue type and reason): Cueing for technique  Where Assessed - Toileting Clothing Manipulation: Sit to stand from 3-in-1 or toilet Toileting - Hygiene: Performed;Modified independent Where Assessed - Toileting Hygiene: Sit on 3-in-1 or toilet ADL Comments: Pt donned back brace with min assist. sitting EOB. Vision/Perception    Cognition Cognition Arousal/Alertness: Awake/alert Overall Cognitive Status: Appears within functional limits for tasks assessed Orientation Level: Oriented X4 Sensation/Coordination Sensation Light Touch: Appears Intact Extremity Assessment RUE Assessment RUE Assessment: Within Functional Limits LUE Assessment LUE Assessment: Within Functional Limits Mobility  Bed Mobility Bed Mobility: Yes Rolling Left: 5: Supervision Rolling Left Details (indicate cue type and reason): cueing for proper leg roll technique Left Sidelying to Sit: 5: Supervision;HOB flat Left Sidelying to Sit Details (indicate cue type and reason): cueing to push through L UE  Sitting - Scoot to Edge of Bed: 6: Modified independent  (Device/Increase time) Transfers Sit to Stand: 5: Supervision;With upper extremity assist;With armrests;From chair/3-in-1 Sit to Stand Details (indicate cue type and reason): supervision for safety  Stand to Sit: 5: Supervision;Without upper extremity assist;To chair/3-in-1 Stand to Sit Details: supervision for safety Exercises   End of Session OT - End of Session Equipment Utilized During Treatment: Gait belt;Back brace Activity Tolerance: Patient tolerated treatment well Patient left: in chair;with call bell in reach;with family/visitor present Nurse Communication: Other (comment) (pt voided and measured urine output) General Behavior During Session: [facility] for tasks performed Cognition: [facility] for tasks performed   11:46 AM  10/12/2011 Cipriano Mile OTR/L Pager [phone] Office [phone]

## 2011-10-12 NOTE — Progress Notes (Signed)
Subjective: Patient reports Has been out of bed with Foley catheter discontinued ambulating feeling reasonably comfortable  Objective: Vital signs in last 24 hours: Temp:  [97.4 F (36.3 C)-98.2 F (36.8 C)] 98.2 F (36.8 C) (03/06 0557) Pulse Rate:  [66-79] 79  (03/06 0557) Resp:  [18-19] 19  (03/06 0557) BP: (95-120)/(59-73) 95/59 mmHg (03/06 0557) SpO2:  [95 %-98 %] 95 % (03/06 0557) Weight:  [91.371 kg (201 lb 7 oz)] 91.371 kg (201 lb 7 oz) (03/05 1607)  Intake/Output from previous day: 03/05 0701 - 03/06 0700 In: 3551.3 [P.O.:540; I.V.:2511.3; IV Piggyback:500] Out: 4285 [Urine:4085; Blood:200] Intake/Output this shift:    Dressing dry and intact motor function intact in lower extremities in iliopsoas quadriceps tibialis anterior and gastrocs  Lab Results: No results found for this basename: WBC:2,HGB:2,HCT:2,PLT:2 in the last 72 hours BMET No results found for this basename: NA:2,K:2,CL:2,CO2:2,GLUCOSE:2,BUN:2,CREATININE:2,CALCIUM:2 in the last 72 hours  Studies/Results: Dg Lumbar Spine 2-3 Views  10/11/2011  *RADIOLOGY REPORT*  Clinical Data: Intraoperative localization.  LUMBAR SPINE - 2-3 VIEW  Comparison: Myelogram 08/24/2011.  Findings: First cross-table lateral view of the lumbar spine demonstrates posterior surgical instruments at the L5 and S1 level.  Second lateral intraoperative image demonstrates posterior surgical instruments at the L4-5 level.  IMPRESSION: Intraoperative localization as above.  Original Report Authenticated By: Cyndie Chime, M.D.   Dg Lumbar Spine 2-3 Views  10/11/2011  *RADIOLOGY REPORT*  Clinical Data: L4-L5 PLIF  LUMBAR SPINE - 2-3 VIEW  Comparison: CT of the lumbar spine 08/24/2011  IMPRESSION: Two intraoperative fluoroscopic spot views of the lower lumbar spine are submitted for evaluation.  These demonstrate a posterior rod and screw fixation device at L4-L5, with an interbody graft at the L4-L5 interspace.  Original Report Authenticated By:  Florencia Reasons, M.D.    Assessment/Plan: Stable postop day 1  LOS: 1 day  Doing well postop day 1   Ayodele Hartsock J 10/12/2011, 9:12 AM

## 2011-10-12 NOTE — Evaluation (Signed)
Physical Therapy Evaluation Patient Details Name: Cynthia Padilla MRN: [id number] DOB: Jan 11, 1951 Today's Date: 10/12/2011  Problem List:  Patient Active Problem List  Diagnoses  . HYPERLIPIDEMIA  . HYPERTENSION  . GERD  . DIVERTICULOSIS, COLON  . COLONIC POLYPS, HX OF  . Hyperglycemia    Past Medical History:  Past Medical History  Diagnosis Date  . History of colonic polyps   . Diverticulitis   . Hyperlipidemia   . Hypertension   . GERD (gastroesophageal reflux disease)   . PONV (postoperative nausea and vomiting)     states she gets deathly sick  . Asthma     had it once when she had episode of bronchitis about 10 yrs ago  . Arthritis     thinks she has some in her spine, bursitis in hips  . Anxiety   . Hepatitis     thinks she had Hepatits as a child   Past Surgical History:  Past Surgical History  Procedure Date  . Left elbow     fx- cast   . Tubal ligation     PT Assessment/Plan/Recommendation PT Assessment Clinical Impression Statement: 61 y.o. femlae admitted to [facility] for posterior lumbar fusion (one level).  She presents today with increased low back pain, decreased gait speed compared to normal, decreased mobility and decreased knowledege of brace use and back precautions.  She would benefit from skilled PT to maximize her independence, functional mobility and safety so that she may retrun home safely with her husband's assist at discharge.   PT Recommendation/Assessment: Patient will need skilled PT in the acute care venue PT Problem List: Decreased activity tolerance;Decreased mobility;Pain (decreased knowledge of precautions) PT Therapy Diagnosis : Difficulty walking;Abnormality of gait;Acute pain PT Plan PT Frequency: Min 5X/week PT Treatment/Interventions: Gait training;Stair training;Functional mobility training;Therapeutic activities;Therapeutic exercise;Balance training;Neuromuscular re-education;Patient/family education PT Recommendation Follow Up  Recommendations: No PT follow up Equipment Recommended: None recommended by PT PT Goals  Acute Rehab PT Goals PT Goal Formulation: With patient Time For Goal Achievement: 7 days Pt will Roll Supine to Right Side: with modified independence PT Goal: Rolling Supine to Right Side - Progress: Goal set today Pt will Roll Supine to Left Side: with modified independence PT Goal: Rolling Supine to Left Side - Progress: Goal set today Pt will go Supine/Side to Sit: with modified independence PT Goal: Supine/Side to Sit - Progress: Goal set today Pt will go Sit to Supine/Side: with modified independence PT Goal: Sit to Supine/Side - Progress: Goal set today Pt will go Stand to Sit: with modified independence PT Goal: Stand to Sit - Progress: Goal set today Pt will Transfer Bed to Chair/Chair to Bed: with modified independence PT Transfer Goal: Bed to Chair/Chair to Bed - Progress: Goal set today Pt will Ambulate: >150 feet;Independently;with gait velocity >(comment) ft/second (gait speed greater than or equal to 2.5 ft/sec) PT Goal: Ambulate - Progress: Goal set today Pt will Go Up / Down Stairs: 3-5 stairs;with modified independence;with rail(s) (one rail) PT Goal: Up/Down Stairs - Progress: Goal set today Additional Goals Additional Goal #1: The patient will be able to report 3/3 back precautions.   PT Goal: Additional Goal #1 - Progress: Goal set today  PT Evaluation Precautions/Restrictions  Precautions Precautions: Back Precaution Booklet Issued: Yes (comment) (back precaution handout) Precaution Comments: reviewed precautions with patient Required Braces or Orthoses: Yes Spinal Brace: Lumbar corset;Applied in sitting position Prior Functioning  Home Living Lives With: Spouse Receives Help From: Family (husband 24 hours as needed) Type  of Home: House Home Layout: Two level;Full bath on main level Alternate Level Stairs-Rails: Left Alternate Level Stairs-Number of Steps: 14 Home  Access: Stairs to enter Entrance Stairs-Rails: Left Entrance Stairs-Number of Steps: 5 Bathroom Shower/Tub: Walk-in shower;Door [employer] Toilet: Standard Home Adaptive Equipment: Built-in shower seat;Hand-held shower hose Prior Function Level of Independence: Independent with basic ADLs;Independent with homemaking with ambulation;Independent with gait;Independent with transfers Able to Take Stairs?: Yes Driving: Yes Vocation: Full time employment Vocation Requirements: HR Education officer, community Orientation Level: Oriented X4 Sensation/Coordination Sensation Light Touch: Appears Intact Extremity Assessment RLE Assessment RLE Assessment: Within Functional Limits (per gross functional assessment) LLE Assessment LLE Assessment: Within Functional Limits (per gross functional assessment) Mobility (including Balance) Transfers Sit to Stand: 5: Supervision;With upper extremity assist;With armrests;From chair/3-in-1 Sit to Stand Details (indicate cue type and reason): supervision for safety  Stand to Sit: 5: Supervision;Without upper extremity assist;To chair/3-in-1 Stand to Sit Details: supervision for safety Ambulation/Gait Ambulation/Gait: Yes Ambulation/Gait Assistance: 5: Supervision Ambulation/Gait Assistance Details (indicate cue type and reason): supervision for safety Ambulation Distance (Feet): 150 Feet Assistive device: None Gait Pattern: Step-through pattern Gait velocity: slow, careful gait > 1.8 ft/sec, but less than 4.4 ft/sec Stairs: Yes Stairs Assistance: 6: Modified independent (Device/Increase time) Stair Management Technique: Two rails;Step to pattern;Alternating pattern;Forwards Number of Stairs: 10  Height of Stairs:  (4-6", practice stairs in neuro gym x2)    End of Session PT - End of Session Equipment Utilized During Treatment: Back brace Activity Tolerance: Patient tolerated treatment well Patient left: in chair;with call bell in  reach General Behavior During Session: [facility] [employer] for tasks performed Cognition: [facility] for tasks performed  Javarus Dorner B. Ariah Mower, PT, DPT [phone] 10/12/2011, 9:37 AM

## 2011-10-12 NOTE — Progress Notes (Signed)
PT Cancellation Note  Treatment cancelled today due to Patient just recieved breakfast.  History and back precaution education and handout given.  PT to check back later for gait and stairs.  The RN reports she has already been up and walking in the hallway with the RW this morning.Lurena Joiner B. Kariel Skillman, PT, DPT [phone] 10/12/2011, 8:37 AM

## 2011-10-13 MED ORDER — OXYCODONE-ACETAMINOPHEN 5-325 MG PO TABS: 1.0000 | ORAL_TABLET | ORAL | Status: AC | PRN

## 2011-10-13 MED ORDER — DIAZEPAM 5 MG PO TABS: 5.0000 mg | ORAL_TABLET | Freq: Four times a day (QID) | ORAL | Status: AC | PRN

## 2011-10-13 MED FILL — Sodium Chloride IV Soln 0.9%: INTRAVENOUS | Qty: 1000 | Status: AC

## 2011-10-13 MED FILL — Sodium Chloride Irrigation Soln 0.9%: Qty: 3000 | Status: AC

## 2011-10-13 MED FILL — Heparin Sodium (Porcine) Inj 1000 Unit/ML: INTRAMUSCULAR | Qty: 30 | Status: AC

## 2011-10-13 NOTE — Progress Notes (Signed)
Upon d/c all education,paper works and priscripton given. D/C ed the IV. Rolled her to downstairs in a wheel chair.

## 2011-10-13 NOTE — Progress Notes (Signed)
Occupational Therapy Treatment Patient Details Name: Cynthia Padilla MRN: [id number] DOB: 24-Nov-1950 Today's Date: 10/13/2011  OT Assessment/Plan OT Assessment/Plan Comments on Treatment Session: Educated pt on safe techniques with use of AE for performing LB bathing/dressing.  Pt politely declined OOB activity at this time due to fatigue. OT Plan: Discharge plan remains appropriate OT Frequency: Min 2X/week Follow Up Recommendations: Supervision - Intermittent Equipment Recommended: None recommended by OT;None recommended by PT OT Goals ADL Goals Pt Will Perform Lower Body Bathing: with modified independence;Sit to stand from chair;Sit to stand from bed;with adaptive equipment ADL Goal: Lower Body Bathing - Progress: Progressing toward goals Pt Will Perform Lower Body Dressing: with modified independence;Sit to stand from chair;Sit to stand from bed;with adaptive equipment ADL Goal: Lower Body Dressing - Progress: Progressing toward goals Miscellaneous OT Goals Miscellaneous OT Goal #2: Pt will perform bed mobility with mod I in prep for EOB ADLs. OT Goal: Miscellaneous Goal #2 - Progress: Progressing toward goals  OT Treatment Precautions/Restrictions  Precautions Precautions: Back Precaution Booklet Issued: Yes (comment) Precaution Comments: Pt able to verbalize 3/3 back precautions. Required Braces or Orthoses: Yes Spinal Brace: Lumbar corset;Applied in sitting position   ADL ADL Lower Body Bathing: Simulated;Supervision/safety Lower Body Bathing Details (indicate cue type and reason): Educated pt on use of AE  Where Assessed - Lower Body Bathing: Sitting, bed Lower Body Dressing: Simulated;Supervision/safety Lower Body Dressing Details (indicate cue type and reason): Educated pt on use of AE Where Assessed - Lower Body Dressing: Sitting, bed Mobility  Bed Mobility Bed Mobility: Yes Rolling Right: 6: Modified independent (Device/Increase time);With rail Right Sidelying to  Sit: 5: Supervision;HOB flat;With rails Sit to Sidelying Right: 5: Supervision;With rail;HOB flat Sit to Sidelying Right Details (indicate cue type and reason): cueing to avoid twisting and arching Exercises    End of Session OT - End of Session Activity Tolerance: Patient limited by fatigue Patient left: with call bell in reach;in bed;with family/visitor present General Behavior During Session: [facility] for tasks performed Cognition: [facility] for tasks performed  12:14 PM 10/13/2011 Cipriano Mile OTR/L Pager [phone] Office [phone]

## 2011-10-13 NOTE — Progress Notes (Signed)
   CARE MANAGEMENT NOTE 10/13/2011  Patient:  Cynthia Padilla,Cynthia Padilla   Account Number:  [id number]  Date Initiated:  10/13/2011  Documentation initiated by:  Johny Shock  Subjective/Objective Assessment:   Order for DME as needed.     Action/Plan:   Met with pt and per pt and PT/OT eval pt does not need HHPT/OT or any DME except a 3:1 commode. Pt workers comp provider notified.   Anticipated DC Date:  10/13/2011   Anticipated DC Plan:  HOME/SELF CARE         PAC Choice  DURABLE MEDICAL EQUIPMENT   Choice offered to / List presented to:  C-1 Patient   DME arranged  3-N-1      DME agency  [facility]        Status of service:  Completed, signed off Medicare Important Message given?   (If response is "NO", the following Medicare IM given date fields will be blank) Date Medicare IM given:   Date Additional Medicare IM given:    Discharge Disposition:  HOME/SELF CARE  Per UR Regulation:    Comments:  10/13/2011 Met with pt and attempted to reach workers comp provider, multiple times, finally discoved that her adjuster was away, referred to several people who were unable to help. However finally spoke with Britta Mccreedy who referred this CM to [facility] however stated that this CM could could get the 3:1 from [facility] if unable to reach [facility]. After multiple calls able to reach [facility] and was referred to DME, after holding for an excessive amount of time and unable to get an answer as to how long the delivery would take, this CM elected to order the 3:1 from [facility] so this pt could d/c to home. Johny Shock RN MPH Case Manager [phone]

## 2011-10-13 NOTE — Discharge Summary (Signed)
Physician Discharge Summary  Patient ID: Cynthia Padilla MRN: [id number] DOB/AGE: [birth year] 61 y.o.  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 *    Discharged Condition: good  Hospital Course: A shunt was admitted to undergo surgical decompression at L4-L5 she tolerated surgery quite well without any complications her incisions remained clean and dry. She is well controlled with oral pain medications and is discharged at this time  Consults: None  Significant Diagnostic Studies: None  Treatments: surgery: Posterior decompression L4-L5 via laminectomy decompression of L4 and L5 nerve roots. Posterior lumbar interbody arthrodesis using peek spacers allograft and autograft. Pedicle screw fixation L4-L5 with posterior lateral arthrodesis using allograft and autograft.  Discharge Exam: Blood pressure 100/62, pulse 77, temperature 98.7 F (37.1 C), temperature source Oral, resp. rate 17, height 5\' 7"  (1.702 m), weight 91.371 kg (201 lb 7 oz), SpO2 93.00%. Motor function is intact in major groups and lower extremities station and gait are normal incision is clean and dry  Disposition:  discharge home  Discharge Orders    Future Appointments: Provider: Department: Dept Phone: Center:   03/26/2012 8:15 AM [facility] [phone] [facility]   04/02/2012 8:15 AM Bruce Sherolyn Buba, MD [facility] [phone] [facility]     Future Orders Please Complete By Expires   Diet - low sodium heart healthy      Increase activity slowly      Discharge instructions      Comments:   Sit straight walk straight stand straight. Mind your posture. Okay to shower. Do not apply salves ointments to incision. Do not resume Celebrex for at least 6 weeks from surgery.   Call MD for:   temperature >100.4      Call MD for:  severe uncontrolled pain      Call MD for:  redness, tenderness, or signs of infection (pain, swelling, redness, odor or green/yellow discharge around incision site)        Medication List  As of 10/13/2011  9:21 AM   STOP taking these medications         celecoxib 200 MG capsule         TAKE these medications         aspirin 81 MG tablet   Take 81 mg by mouth daily.      Coenzyme Q10 150 MG Caps   150 mg daily.      diazepam 5 MG tablet   Commonly known as: VALIUM   Take 1 tablet (5 mg total) by mouth every 6 (six) hours as needed (Muscle spasm).      esomeprazole 40 MG capsule   Commonly known as: NEXIUM   Take 40 mg by mouth daily before breakfast.      hydrochlorothiazide 25 MG tablet   Commonly known as: HYDRODIURIL   Take 25 mg by mouth daily.      HYDROcodone-acetaminophen 5-325 MG per tablet   Commonly known as: NORCO   Take 1 tablet by mouth every 6 (six) hours as needed. For pain      oxyCODONE-acetaminophen 5-325 MG per tablet   Commonly known as: PERCOCET   Take 1-2 tablets by mouth every 4 (four) hours as needed for pain.      SYSTANE OP   Apply 1 drop to eye daily as needed. For dry eyes  traMADol 50 MG tablet   Commonly known as: ULTRAM   Take 50 mg by mouth every 6 (six) hours as needed. For pain      valACYclovir 500 MG tablet   Commonly known as: VALTREX   Take 500 mg by mouth daily as needed. For flare-ups      VITAMIN D-3 PO   Take 1,200 mg by mouth daily.           Follow-up Information    Follow up with Stefani Dama, MD. Schedule an appointment as soon as possible for a visit in 3 weeks. (Call Aram Beecham for appointment)    Contact information:   [address], [address] [place] [address] [phone]          Signed: Stefani Dama 10/13/2011, 9:21 AM

## 2011-10-13 NOTE — Progress Notes (Signed)
10/13/2011 PT checked on patient who is ambulating hallways independently without device.  OT reviewed proper bed mobility with her yesterday and she has no further questions re: back precautions or activity progression.  She is due to discharge home today and does not need DME or f/u PT.   Rollene Rotunda Nesta Scaturro, PT, DPT [phone]

## 2011-10-13 NOTE — Progress Notes (Signed)
Utilization review completed. Delina Kruczek, RN, BSN. 10/13/11 

## 2011-11-02 ENCOUNTER — Ambulatory Visit (HOSPITAL_COMMUNITY)
Admission: RE | Admit: 2011-11-02 | Discharge: 2011-11-02 | Disposition: A | Discharge status: Home / Self Care | Payer: BC Managed Care – PPO | Source: Ambulatory Visit | Attending: Neurological Surgery | Admitting: Neurological Surgery

## 2011-11-02 DIAGNOSIS — M7989 Other specified soft tissue disorders: Secondary | ICD-10-CM

## 2011-11-02 DIAGNOSIS — M25473 Effusion, unspecified ankle: Secondary | ICD-10-CM

## 2011-11-02 NOTE — Progress Notes (Signed)
*  PRELIMINARY RESULTS* Vascular Ultrasound Right lower extremity venous duplex has been completed.  Preliminary findings: Right= No evidence of DVT or baker's cyst.  Farrel Demark RDMS 11/02/2011, 3:32 PM

## 2011-11-13 ENCOUNTER — Other Ambulatory Visit: Payer: Self-pay | Admitting: Internal Medicine

## 2011-11-25 ENCOUNTER — Ambulatory Visit (INDEPENDENT_AMBULATORY_CARE_PROVIDER_SITE_OTHER): Payer: BC Managed Care – PPO | Admitting: Family Medicine

## 2011-11-25 ENCOUNTER — Encounter: Payer: Self-pay | Admitting: Family Medicine

## 2011-11-25 VITALS — BP 110/68 | Temp 98.5°F | Wt 214.0 lb

## 2011-11-25 DIAGNOSIS — R1032 Left lower quadrant pain: Secondary | ICD-10-CM

## 2011-11-25 MED ORDER — CIPROFLOXACIN HCL 500 MG PO TABS: 500.0000 mg | ORAL_TABLET | Freq: Two times a day (BID) | ORAL | Status: AC

## 2011-11-25 MED ORDER — METRONIDAZOLE 500 MG PO TABS: 500.0000 mg | ORAL_TABLET | Freq: Three times a day (TID) | ORAL | Status: AC

## 2011-11-25 NOTE — Progress Notes (Signed)
  Subjective:    Patient ID: Cynthia Padilla, female    DOB: [birth year], 61 y.o.   MRN: [id number]  HPI  Acute visit. Patient seen with 2 day history of left lower quadrant abdominal pain. She has known history of diverticulitis . She denies any fever or chills. She had some constipation last week but none since then. No recent diarrhea. No bloody stool. Pain is relatively constant. Worse with direct pressure over this area. No alleviating factors. She denies any dysuria or hematuria. No improvement or worsening with change of position. She's had previous colonoscopy about 7 years ago. History of previous hysterectomy. Pain is mild to moderate in severity. No radiation.  Past Medical History  Diagnosis Date  . History of colonic polyps   . Diverticulitis   . Hyperlipidemia   . Hypertension   . GERD (gastroesophageal reflux disease)   . PONV (postoperative nausea and vomiting)     states she gets deathly sick  . Asthma     had it once when she had episode of bronchitis about 10 yrs ago  . Arthritis     thinks she has some in her spine, bursitis in hips  . Anxiety   . Hepatitis     thinks she had Hepatits as a child   Past Surgical History  Procedure Date  . Left elbow     fx- cast   . Tubal ligation     reports that she has never smoked. She has never used smokeless tobacco. She reports that she drinks about 1.8 ounces of alcohol per week. She reports that she does not use illicit drugs. family history includes Cancer in her mother; Diabetes in her brothers, father, and sisters; and Heart disease in her father.  There is no history of Anesthesia problems. No Known Allergies   Review of Systems  Constitutional: Negative for fever and chills.  Respiratory: Negative for shortness of breath.   Cardiovascular: Negative for chest pain.  Gastrointestinal: Positive for abdominal pain. Negative for nausea, vomiting, diarrhea, constipation, blood in stool and abdominal distention.    Genitourinary: Negative for dysuria, hematuria and flank pain.       Objective:   Physical Exam  Constitutional: She appears well-developed and well-nourished. No distress.  HENT:  Mouth/Throat: Oropharynx is clear and moist.  Neck: Neck supple. No thyromegaly present.  Cardiovascular: Normal rate and regular rhythm.   Pulmonary/Chest: Effort normal. No respiratory distress. She has no wheezes. She has no rales.  Abdominal: Soft. Bowel sounds are normal. She exhibits no distension and no mass. There is no rebound and no guarding.       She has some mild tenderness to palpation left lower quadrant  Lymphadenopathy:    She has no cervical adenopathy.  Skin: No rash noted.          Assessment & Plan:  Abdominal pain, left lower quadrant. Suspect diverticulitis. Start Cipro 500 mg twice a day for 10 days and Flagyl 500 mg 3 times a day for 10 days. Followup promptly for any fever or worsening abdominal pain.

## 2011-11-25 NOTE — Patient Instructions (Signed)
Diverticulitis A diverticulum is a small pouch or sac on the colon. Diverticulosis is the presence of these diverticula on the colon. Diverticulitis is the irritation (inflammation) or infection of diverticula. CAUSES  The colon and its diverticula contain bacteria. If food particles block the tiny opening to a diverticulum, the bacteria inside can grow and cause an increase in pressure. This leads to infection and inflammation and is called diverticulitis. SYMPTOMS   Abdominal pain and tenderness. Usually, the pain is located on the left side of your abdomen. However, it could be located elsewhere.   Fever.   Bloating.   Feeling sick to your stomach (nausea).   Throwing up (vomiting).   Abnormal stools.  DIAGNOSIS  Your caregiver will take a history and perform a physical exam. Since many things can cause abdominal pain, other tests may be necessary. Tests may include:  Blood tests.   Urine tests.   X-ray of the abdomen.   CT scan of the abdomen.  Sometimes, surgery is needed to determine if diverticulitis or other conditions are causing your symptoms. TREATMENT  Most of the time, you can be treated without surgery. Treatment includes:  Resting the bowels by only having liquids for a few days. As you improve, you will need to eat a low-fiber diet.   Intravenous (IV) fluids if you are losing body fluids (dehydrated).   Antibiotic medicines that treat infections may be given.   Pain and nausea medicine, if needed.   Surgery if the inflamed diverticulum has burst.  HOME CARE INSTRUCTIONS   Try a clear liquid diet (broth, tea, or water for as long as directed by your caregiver). You may then gradually begin a low-fiber diet as tolerated. A low-fiber diet is a diet with less than 10 grams of fiber. Choose the foods below to reduce fiber in the diet:   White breads, cereals, rice, and pasta.   Cooked fruits and vegetables or soft fresh fruits and vegetables without the skin.     Ground or well-cooked tender beef, ham, veal, lamb, pork, or poultry.   Eggs and seafood.   After your diverticulitis symptoms have improved, your caregiver may put you on a high-fiber diet. A high-fiber diet includes 14 grams of fiber for every 1000 calories consumed. For a standard 2000 calorie diet, you would need 28 grams of fiber. Follow these diet guidelines to help you increase the fiber in your diet. It is important to slowly increase the amount fiber in your diet to avoid gas, constipation, and bloating.   Choose whole-grain breads, cereals, pasta, and brown rice.   Choose fresh fruits and vegetables with the skin on. Do not overcook vegetables because the more vegetables are cooked, the more fiber is lost.   Choose more nuts, seeds, legumes, dried peas, beans, and lentils.   Look for food products that have greater than 3 grams of fiber per serving on the Nutrition Facts label.   Take all medicine as directed by your caregiver.   If your caregiver has given you a follow-up appointment, it is very important that you go. Not going could result in lasting (chronic) or permanent injury, pain, and disability. If there is any problem keeping the appointment, call to reschedule.  SEEK MEDICAL CARE IF:   Your pain does not improve.   You have a hard time advancing your diet beyond clear liquids.   Your bowel movements do not return to normal.  SEEK IMMEDIATE MEDICAL CARE IF:   Your pain becomes   worse.   You have an oral temperature above 102 F (38.9 C), not controlled by medicine.   You have repeated vomiting.   You have bloody or black, tarry stools.   Symptoms that brought you to your caregiver become worse or are not getting better.  MAKE SURE YOU:   Understand these instructions.   Will watch your condition.   Will get help right away if you are not doing well or get worse.  Document Released: 05/04/2005 Document Revised: 07/14/2011 Document Reviewed:  08/30/2010 [employer] Patient Information 2012 [employer]. 

## 2012-01-04 ENCOUNTER — Ambulatory Visit (INDEPENDENT_AMBULATORY_CARE_PROVIDER_SITE_OTHER): Payer: BC Managed Care – PPO | Admitting: Family Medicine

## 2012-01-04 ENCOUNTER — Encounter: Payer: Self-pay | Admitting: Family Medicine

## 2012-01-04 VITALS — BP 112/78 | HR 79 | Temp 98.7°F | Wt 213.0 lb

## 2012-01-04 DIAGNOSIS — J329 Chronic sinusitis, unspecified: Secondary | ICD-10-CM

## 2012-01-04 MED ORDER — AZITHROMYCIN 250 MG PO TABS: ORAL_TABLET | ORAL | Status: AC

## 2012-01-04 NOTE — Progress Notes (Signed)
  Subjective:    Patient ID: Cynthia Padilla, female    DOB: 03-May-1951, 61 y.o.   MRN: [id number]  HPI Here for 5 days of sinus pressure, HA, PND, ST, and coughing up green sputum. No fever.    Review of Systems  Constitutional: Negative.   HENT: Positive for congestion, postnasal drip and sinus pressure.   Eyes: Negative.   Respiratory: Positive for cough.        Objective:   Physical Exam  Constitutional: She appears well-developed and well-nourished.  HENT:  Right Ear: External ear normal.  Left Ear: External ear normal.  Nose: Nose normal.  Mouth/Throat: Oropharynx is clear and moist. No oropharyngeal exudate.  Eyes: Conjunctivae are normal.  Neck: No thyromegaly present.  Pulmonary/Chest: Effort normal and breath sounds normal.  Lymphadenopathy:    She has no cervical adenopathy.          Assessment & Plan:  Add Mucinex

## 2012-01-10 ENCOUNTER — Telehealth: Payer: Self-pay | Admitting: Internal Medicine

## 2012-01-10 ENCOUNTER — Telehealth (INDEPENDENT_AMBULATORY_CARE_PROVIDER_SITE_OTHER): Payer: Self-pay | Admitting: General Surgery

## 2012-01-10 NOTE — Telephone Encounter (Signed)
Pt calling for advice.  She is pt of Dr. Johna Sheriff for long-term follow-up; has appt 02/17/12.  Began to have rectal bleeding last night.  No protruding hems, no pain.  She has a PCP and a gastroenterologist (with history of diverticulitis.)  Recommended she call her gastro MD, as they will be familiar with her history and can probably see her more immediately as an established pt of theirs.  She understands and will call Dr. [name] today.

## 2012-01-10 NOTE — Telephone Encounter (Signed)
Pt states that she has been having some rectal bleeding, she is requesting to be seen. Pt scheduled to see Amy Esterwood PA 01/11/12@9am . Pt aware of appt date and time.

## 2012-01-11 ENCOUNTER — Ambulatory Visit (INDEPENDENT_AMBULATORY_CARE_PROVIDER_SITE_OTHER): Payer: BC Managed Care – PPO | Admitting: Physician Assistant

## 2012-01-11 ENCOUNTER — Encounter: Payer: Self-pay | Admitting: Internal Medicine

## 2012-01-11 ENCOUNTER — Encounter: Payer: Self-pay | Admitting: Physician Assistant

## 2012-01-11 VITALS — BP 120/82 | HR 64 | Ht 67.5 in | Wt 212.0 lb

## 2012-01-11 DIAGNOSIS — K625 Hemorrhage of anus and rectum: Secondary | ICD-10-CM

## 2012-01-11 DIAGNOSIS — R1032 Left lower quadrant pain: Secondary | ICD-10-CM

## 2012-01-11 DIAGNOSIS — Z800 Family history of malignant neoplasm of digestive organs: Secondary | ICD-10-CM

## 2012-01-11 MED ORDER — MOVIPREP 100 G PO SOLR: ORAL | Status: DC

## 2012-01-11 NOTE — Progress Notes (Signed)
Agree with Ms. Esterwood's assessment and plan. Antanette Richwine E. Mykayla Brinton, MD, FACG   

## 2012-01-11 NOTE — Progress Notes (Signed)
Subjective:    Patient ID: Cynthia Padilla, female    DOB: 1950-10-11, 61 y.o.   MRN: [id number]  HPI Cynthia Padilla is very nice 61 year old white female known to Dr. Yancey Flemings . She has hx of history of GERD and diverticular disease. Her last colonoscopy was done in March of 2006,she was noted to have descending colon diverticulosis and a subtle 2 mm irregularity  at the anal verge. This was biopsied and was consistent with HPV with low-grade dysplasia. She has been followed annually since by Dr. Johna Sheriff  with serial anoscopy. She had not shown any signs of any progression and Dr. Johna Sheriff told her last year she could skip a year and is due for followup with him in July. She is also followed  by Dr. Henderson Cloud for GYN, and Pap smears have been negative.  Patient states that she had an episode of diverticulitis about a month ago and was seen by her primary care physician and given a course of Cipro and Flagyl which she says she finished about 2 weeks ago. At the end of the course of the antibiotics she had felt constipated had some hard stools and then noticed some blood on the tissue and mixed with her bowel movements. Over the past week she has continued to see small amounts of blood blood mixed with the stool and on the tissue. She says she has had some ongoing left lower quadrant discomfort for several months which feels different than the diverticulitis. She has had no rectal pain and pressure etc.  Patient also has family history of a sister with anorectal cancer.   Review of Systems  Constitutional: Negative.   HENT: Negative.   Eyes: Negative.   Respiratory: Negative.   Cardiovascular: Negative.   Gastrointestinal: Positive for abdominal pain, blood in stool and anal bleeding.  Genitourinary: Negative.   Musculoskeletal: Positive for back pain.  Neurological: Negative.   Hematological: Negative.   Psychiatric/Behavioral: Negative.    Outpatient Encounter Prescriptions as of 01/11/2012  Medication  Sig Dispense Refill  . aspirin 81 MG tablet Take 81 mg by mouth daily.        . Cholecalciferol (VITAMIN D-3 PO) Take 1,200 mg by mouth daily.      . Coenzyme Q10 (COQ-10) 150 MG CAPS 150 mg daily.       Marland Kitchen esomeprazole (NEXIUM) 40 MG capsule Take 40 mg by mouth daily before breakfast.      . hydrochlorothiazide (HYDRODIURIL) 25 MG tablet Take 25 mg by mouth daily.      . hydrochlorothiazide (HYDRODIURIL) 25 MG tablet TAKE  ONE TABLET BY MOUTH EVERY MORNING  90 tablet  1  . HYDROcodone-acetaminophen (NORCO) 5-325 MG per tablet Take 1 tablet by mouth every 6 (six) hours as needed. For pain      . Polyethyl Glycol-Propyl Glycol (SYSTANE OP) Apply 1 drop to eye daily as needed. For dry eyes      . traMADol (ULTRAM) 50 MG tablet Take 50 mg by mouth every 6 (six) hours as needed. For pain      . valACYclovir (VALTREX) 500 MG tablet Take 500 mg by mouth daily as needed. For flare-ups      . MOVIPREP 100 G SOLR Take as directed.  Moviprep  1 kit  0   No Known Allergies     Patient Active Problem List  Diagnoses  . HYPERLIPIDEMIA  . HYPERTENSION  . GERD  . DIVERTICULOSIS, COLON  . COLONIC POLYPS, HX OF  . Hyperglycemia  History   Social History  . Marital Status: Married    Spouse Name: N/A    Number of Children: N/A  . 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 Smoking status: Never Smoker   . Smokeless tobacco: Never Used  . Alcohol Use: 1.8 oz/week    3 Glasses of wine per week  . Drug Use: No  . Sexually Active: Not on file   Other Topics Concern  . Not on file   Social History Narrative  . No narrative on file    Objective:   Physical Exam well-developed white female in no acute distress, pleasant blood pressure 120/82 pulse 64 height 5 foot 7 weight 212. HEENT; nontraumatic normocephalic EOMI PERRLA sclera anicteric, Cardiovascular; regular rate and rhythm with S1-S2 no murmur or gallop, Pulmonary;  clear bilaterally, Abdomen; soft bowel sounds are active no palpable mass or hepatosplenomegaly she has mild tenderness in the left lower quadrant no guarding no rebound, Rectal; exam no external anal abnormality noted, no abnormality on digital exam stool is brown and Hemoccult positive. Extremities no clubbing cyanosis or edema skin warm and dry, Psych; mood and affect normal and appropriate, anxious        Assessment & Plan:  #73 61 year old female with history of diverticulosis and an anal papilloma/HPV with low-grade dysplasia found at colonoscopy on 2006, and followed clinically by Dr. Johna Sheriff since. Outpatient presenting now with complaints of small-volume rectal bleeding over the past 2 weeks, onset after an episode of diverticulitis. She also complains of mild left lower quadrant discomfort which has been ongoing for several months. Will need to rule out ano- rectal neoplasm.  Plan; schedule for colonoscopy with Dr. [name] as soon as possible. Procedure and indications were discussed with the patient in detail and she is anxious to proceed.

## 2012-01-11 NOTE — Patient Instructions (Signed)
We have scheduled the colonoscopy with Dr. [name] for 01-31-2012.3 Directions provided. We faxed the prescription for colonoscopy prep to [facility].  Colonoscopy A colonoscopy is an exam to evaluate your entire colon. In this exam, your colon is cleansed. A long fiberoptic tube is inserted through your rectum and into your colon. The fiberoptic scope (endoscope) is a long bundle of enclosed and very flexible fibers. These fibers transmit light to the area examined and send images from that area to your caregiver. Discomfort is usually minimal. You may be given a drug to help you sleep (sedative) during or prior to the procedure. This exam helps to detect lumps (tumors), polyps, inflammation, and areas of bleeding. Your caregiver may also take a small piece of tissue (biopsy) that will be examined under a microscope. LET YOUR CAREGIVER KNOW ABOUT:   Allergies to food or medicine.   Medicines taken, including vitamins, herbs, eyedrops, over-the-counter medicines, and creams.   Use of steroids (by mouth or creams).   Previous problems with anesthetics or numbing medicines.   History of bleeding problems or blood clots.   Previous surgery.   Other health problems, including diabetes and kidney problems.   Possibility of pregnancy, if this applies.  BEFORE THE PROCEDURE   A clear liquid diet may be required for 2 days before the exam.   Ask your caregiver about changing or stopping your regular medications.   Liquid injections (enemas) or laxatives may be required.   A large amount of electrolyte solution may be given to you to drink over a short period of time. This solution is used to clean out your colon.   You should be present 60 minutes prior to your procedure or as directed by your caregiver.  AFTER THE PROCEDURE   If you received a sedative or pain relieving medication, you will need to arrange for someone to drive you home.   Occasionally, there is a little blood  passed with the first bowel movement. Do not be concerned.  FINDING OUT THE RESULTS OF YOUR TEST Not all test results are available during your visit. If your test results are not back during the visit, make an appointment with your caregiver to find out the results. Do not assume everything is normal if you have not heard from your caregiver or the medical facility. It is important for you to follow up on all of your test results. HOME CARE INSTRUCTIONS   It is not unusual to pass moderate amounts of gas and experience mild abdominal cramping following the procedure. This is due to air being used to inflate your colon during the exam. Walking or a warm pack on your belly (abdomen) may help.   You may resume all normal meals and activities after sedatives and medicines have worn off.   Only take over-the-counter or prescription medicines for pain, discomfort, or fever as directed by your caregiver. Do not use aspirin or blood thinners if a biopsy was taken. Consult your caregiver for medicine usage if biopsies were taken.  SEEK IMMEDIATE MEDICAL CARE IF:   You have a fever.   You pass large blood clots or fill a toilet with blood following the procedure. This may also occur 10 to 14 days following the procedure. This is more likely if a biopsy was taken.   You develop abdominal pain that keeps getting worse and cannot be relieved with medicine.  Document Released: 07/22/2000 Document Revised: 07/14/2011 Document Reviewed: 03/06/2008 [facility] Patient Information 2012 [place], [place].

## 2012-01-23 ENCOUNTER — Other Ambulatory Visit: Payer: Self-pay | Admitting: Internal Medicine

## 2012-01-31 ENCOUNTER — Ambulatory Visit (AMBULATORY_SURGERY_CENTER): Payer: BC Managed Care – PPO | Admitting: Internal Medicine

## 2012-01-31 ENCOUNTER — Encounter: Payer: Self-pay | Admitting: Internal Medicine

## 2012-01-31 VITALS — BP 141/83 | HR 66 | Temp 97.5°F | Resp 14 | Ht 67.0 in | Wt 212.0 lb

## 2012-01-31 DIAGNOSIS — D126 Benign neoplasm of colon, unspecified: Secondary | ICD-10-CM

## 2012-01-31 DIAGNOSIS — K625 Hemorrhage of anus and rectum: Secondary | ICD-10-CM

## 2012-01-31 DIAGNOSIS — Z800 Family history of malignant neoplasm of digestive organs: Secondary | ICD-10-CM

## 2012-01-31 DIAGNOSIS — R1032 Left lower quadrant pain: Secondary | ICD-10-CM

## 2012-01-31 DIAGNOSIS — K5730 Diverticulosis of large intestine without perforation or abscess without bleeding: Secondary | ICD-10-CM

## 2012-01-31 MED ORDER — SODIUM CHLORIDE 0.9 % IV SOLN: 500.0000 mL | INTRAVENOUS | Status: DC

## 2012-01-31 NOTE — Progress Notes (Signed)
Patient did not experience any of the following events: a burn prior to discharge; a fall within the facility; wrong site/side/patient/procedure/implant event; or a hospital transfer or hospital admission upon discharge from the facility. (G8907) Patient did not have preoperative order for IV antibiotic SSI prophylaxis. (G8918)  

## 2012-01-31 NOTE — Patient Instructions (Addendum)
Handouts were given to your care partner on polyps, diverticulosis, high fiber diet and hemorrhoids.  You may resume your prior medications today.  Please call if any questions or concerns.    YOU HAD AN ENDOSCOPIC PROCEDURE TODAY AT THE [facility]: Refer to the procedure report that was given to you for any specific questions about what was found during the examination.  If the procedure report does not answer your questions, please call your gastroenterologist to clarify.  If you requested that your care partner not be given the details of your procedure findings, then the procedure report has been included in a sealed envelope for you to review at your convenience later.  YOU SHOULD EXPECT: Some feelings of bloating in the abdomen. Passage of more gas than usual.  Walking can help get rid of the air that was put into your GI tract during the procedure and reduce the bloating. If you had a lower endoscopy (such as a colonoscopy or flexible sigmoidoscopy) you may notice spotting of blood in your stool or on the toilet paper. If you underwent a bowel prep for your procedure, then you may not have a normal bowel movement for a few days.  DIET: Your first meal following the procedure should be a light meal and then it is ok to progress to your normal diet.  A half-sandwich or bowl of soup is an example of a good first meal.  Heavy or fried foods are harder to digest and may make you feel nauseous or bloated.  Likewise meals heavy in dairy and vegetables can cause extra gas to form and this can also increase the bloating.  Drink plenty of fluids but you should avoid alcoholic beverages for 24 hours.  ACTIVITY: Your care partner should take you home directly after the procedure.  You should plan to take it easy, moving slowly for the rest of the day.  You can resume normal activity the day after the procedure however you should NOT DRIVE or use heavy machinery for 24 hours (because of the  sedation medicines used during the test).    SYMPTOMS TO REPORT IMMEDIATELY: A gastroenterologist can be reached at any hour.  During normal business hours, 8:30 AM to 5:00 PM Monday through Friday, call [phone].  After hours and on weekends, please call the GI answering service at [phone] who will take a message and have the physician on call contact you.   Following lower endoscopy (colonoscopy or flexible sigmoidoscopy):  Excessive amounts of blood in the stool  Significant tenderness or worsening of abdominal pains  Swelling of the abdomen that is new, acute  Fever of 100F or higher    FOLLOW UP: If any biopsies were taken you will be contacted by phone or by letter within the next 1-3 weeks.  Call your gastroenterologist if you have not heard about the biopsies in 3 weeks.  Our staff will call the home number listed on your records the next business day following your procedure to check on you and address any questions or concerns that you may have at that time regarding the information given to you following your procedure. This is a courtesy call and so if there is no answer at the home number and we have not heard from you through the emergency physician on call, we will assume that you have returned to your regular daily activities without incident.  SIGNATURES/CONFIDENTIALITY: You and/or your care partner have signed paperwork which will be entered  into your electronic medical record.  These signatures attest to the fact that that the information above on your After Visit Summary has been reviewed and is understood.  Full responsibility of the confidentiality of this discharge information lies with you and/or your care-partner.

## 2012-01-31 NOTE — Op Note (Signed)
[facility] [address] [place], [place]  [address]  COLONOSCOPY PROCEDURE REPORT  PATIENT:  Cynthia Padilla, Cynthia Padilla  MR#:  [id number] BIRTHDATE:  [birth year], 60 yrs. old  GENDER:  female ENDOSCOPIST:  Wilhemina Bonito. Eda Keys, MD REF. BY:  Office / Self PROCEDURE DATE:  01/31/2012 PROCEDURE:  Colonoscopy with snare polypectomy x 1 ASA CLASS:  Class II INDICATIONS:  rectal bleeding, Abdominal pain ; index exam 2006 high grade AIN (excision Dr Johna Sheriff) MEDICATIONS:   MAC sedation, administered by CRNA, propofol (Diprivan) 480 mg IV  DESCRIPTION OF PROCEDURE:   After the risks benefits and alternatives of the procedure were thoroughly explained, informed consent was obtained.  Digital rectal exam was performed and revealed no abnormalities.   The LB [id number]-[id number] endoscope was introduced through the anus and advanced to the cecum, which was identified by both the appendix and ileocecal valve, without limitations.  The quality of the prep was excellent, using MoviPrep.  The instrument was then slowly withdrawn as the colon was fully examined. <<PROCEDUREIMAGES>>  FINDINGS:  A diminutive polyp was found in the ascending colon and snared without cautery. Retrieval was successful. Moderate diverticulosis was found in the left colon.   Retroflexed views in the rectum revealed internal hemorrhoids.    The time to cecum = 7:53  minutes. The scope was then withdrawn in  13:22  minutes from the cecum and the procedure completed.  COMPLICATIONS:  None  ENDOSCOPIC IMPRESSION: 1) Diminutive polyp in the ascending colon - removed 2) Moderate diverticulosis in the left colon 3) Internal hemorrhoids  RECOMMENDATIONS: 1) Repeat colonoscopy in 5 years if polyp adenomatous; otherwise 10 years  ______________________________ Wilhemina Bonito. Eda Keys, MD  CC:  Lindley Magnus, MD; Glenna Fellows, MD; The Patient  n. eSIGNED:   Wilhemina Bonito. Eda Keys at 01/31/2012 11:40 AM  Ardeth Sportsman,  [phone]

## 2012-01-31 NOTE — Progress Notes (Signed)
No complaints noted in the recovery room. Maw   

## 2012-02-01 ENCOUNTER — Telehealth: Payer: Self-pay

## 2012-02-01 NOTE — Telephone Encounter (Signed)
Left message on answering machine. 

## 2012-02-06 ENCOUNTER — Encounter: Payer: Self-pay | Admitting: Internal Medicine

## 2012-02-17 ENCOUNTER — Ambulatory Visit (INDEPENDENT_AMBULATORY_CARE_PROVIDER_SITE_OTHER): Payer: BC Managed Care – PPO | Admitting: General Surgery

## 2012-02-17 ENCOUNTER — Encounter (INDEPENDENT_AMBULATORY_CARE_PROVIDER_SITE_OTHER): Payer: Self-pay | Admitting: General Surgery

## 2012-02-17 VITALS — BP 130/92 | HR 71 | Temp 97.4°F | Ht 67.5 in | Wt 211.0 lb

## 2012-02-17 DIAGNOSIS — D013 Carcinoma in situ of anus and anal canal: Secondary | ICD-10-CM

## 2012-02-17 NOTE — Progress Notes (Signed)
Chief complaint: Followup anal dysplasia  History: Patient returns for followup after diagnosis of anal dysplasia found a colonoscopy. 2006. A small polyp was removed showing high-grade atypia. She had a sister also with squamous cancer of the anus. Following this we did an exam under anesthesia with multiple biopsies showing only low-grade dysplasia and HPV effect. She has had annual and then by annual followup since. She currently denies any anorectal symptoms, specifically no bleeding, itching, burning or irritation. Past Medical History  Diagnosis Date  . History of colonic polyps   . Diverticulitis   . Hyperlipidemia   . Hypertension   . GERD (gastroesophageal reflux disease)   . PONV (postoperative nausea and vomiting)     states she gets deathly sick  . Asthma     had it once when she had episode of bronchitis about 10 yrs ago  . Arthritis     thinks she has some in her spine, bursitis in hips  . Anxiety   . Hepatitis     thinks she had Hepatits as a child   Past Surgical History  Procedure Date  . Left elbow     fx- cast   . Tubal ligation   . Laminectomy 10/11/11    with fusion @ L4-5   Current Outpatient Prescriptions  Medication Sig Dispense Refill  . aspirin 81 MG tablet Take 81 mg by mouth daily.        . CELEBREX 200 MG capsule       . Cholecalciferol (VITAMIN D-3 PO) Take 1,200 mg by mouth daily.      . Coenzyme Q10 (COQ-10) 150 MG CAPS 150 mg daily.       . hydrochlorothiazide (HYDRODIURIL) 25 MG tablet TAKE  ONE TABLET BY MOUTH EVERY MORNING  90 tablet  1  . NEXIUM 40 MG capsule TAKE 1 CAPSULE ONCE A DAY  90 capsule  0   No Known Allergies  Exam: Gen.: Well-developed female in no distress Abdomen: Soft nontender Rectal: External anal exam shows some very minimal noninflamed external hemorrhoids and normal anoderm. Digital exam was negative without mass or blood or tenderness.  Anoscopy: Thorough anoscopy was performed which showed no abnormalities of the  lining of the anal canal examining the dentate line carefully 360.  Assessment and plan: History of high-grade anal dysplasia and negative for followup. I recommended return for repeat exam in 2 years

## 2012-02-21 ENCOUNTER — Encounter (INDEPENDENT_AMBULATORY_CARE_PROVIDER_SITE_OTHER): Payer: Self-pay

## 2012-03-26 ENCOUNTER — Other Ambulatory Visit: Payer: Self-pay

## 2012-03-28 ENCOUNTER — Other Ambulatory Visit (INDEPENDENT_AMBULATORY_CARE_PROVIDER_SITE_OTHER): Payer: BC Managed Care – PPO

## 2012-03-28 DIAGNOSIS — Z0000 Encounter for general adult medical examination without abnormal findings: Secondary | ICD-10-CM

## 2012-03-28 LAB — HEPATIC FUNCTION PANEL
ALT: 14 U/L (ref 0–35)
Albumin: 4.4 g/dL (ref 3.5–5.2)
Alkaline Phosphatase: 62 U/L (ref 39–117)
Bilirubin, Direct: 0.1 mg/dL (ref 0.0–0.3)
Total Protein: 7.2 g/dL (ref 6.0–8.3)

## 2012-03-28 LAB — LIPID PANEL
Cholesterol: 203 mg/dL — ABNORMAL HIGH (ref 0–200)
Total CHOL/HDL Ratio: 3
Triglycerides: 129 mg/dL (ref 0.0–149.0)

## 2012-03-28 LAB — POCT URINALYSIS DIPSTICK
Bilirubin, UA: NEGATIVE
Glucose, UA: NEGATIVE
Ketones, UA: NEGATIVE
Nitrite, UA: NEGATIVE

## 2012-03-28 LAB — CBC WITH DIFFERENTIAL/PLATELET
Basophils Relative: 0.6 % (ref 0.0–3.0)
Eosinophils Absolute: 0.1 10*3/uL (ref 0.0–0.7)
Eosinophils Relative: 1.7 % (ref 0.0–5.0)
Hemoglobin: 13.8 g/dL (ref 12.0–15.0)
MCHC: 32.8 g/dL (ref 30.0–36.0)
MCV: 93.3 fl (ref 78.0–100.0)
Monocytes Absolute: 0.4 10*3/uL (ref 0.1–1.0)
Neutro Abs: 3.3 10*3/uL (ref 1.4–7.7)
Neutrophils Relative %: 47.2 % (ref 43.0–77.0)
RBC: 4.53 Mil/uL (ref 3.87–5.11)
WBC: 7.1 10*3/uL (ref 4.5–10.5)

## 2012-03-28 LAB — BASIC METABOLIC PANEL
CO2: 31 mEq/L (ref 19–32)
Chloride: 102 mEq/L (ref 96–112)
Potassium: 3.9 mEq/L (ref 3.5–5.1)
Sodium: 140 mEq/L (ref 135–145)

## 2012-04-02 ENCOUNTER — Ambulatory Visit (INDEPENDENT_AMBULATORY_CARE_PROVIDER_SITE_OTHER): Payer: BC Managed Care – PPO | Admitting: Internal Medicine

## 2012-04-02 ENCOUNTER — Encounter: Payer: Self-pay | Admitting: Internal Medicine

## 2012-04-02 VITALS — BP 124/88 | HR 72 | Temp 98.3°F | Ht 68.0 in | Wt 212.0 lb

## 2012-04-02 DIAGNOSIS — R911 Solitary pulmonary nodule: Secondary | ICD-10-CM | POA: Insufficient documentation

## 2012-04-02 DIAGNOSIS — Z0000 Encounter for general adult medical examination without abnormal findings: Secondary | ICD-10-CM

## 2012-04-02 DIAGNOSIS — D013 Carcinoma in situ of anus and anal canal: Secondary | ICD-10-CM

## 2012-04-02 NOTE — Assessment & Plan Note (Signed)
Schedule CT for september

## 2012-04-02 NOTE — Assessment & Plan Note (Signed)
No need for evaluation

## 2012-04-03 NOTE — Progress Notes (Signed)
Patient ID: Cynthia Padilla, female   DOB: September 15, 1950, 61 y.o.   MRN: [id number] cpx  Past Medical History  Diagnosis Date  . History of colonic polyps   . Diverticulitis   . Hyperlipidemia   . Hypertension   . GERD (gastroesophageal reflux disease)   . PONV (postoperative nausea and vomiting)     states she gets deathly sick  . Asthma     had it once when she had episode of bronchitis about 10 yrs ago  . Arthritis     thinks she has some in her spine, bursitis in hips  . Anxiety   . Hepatitis     thinks she had Hepatits as a child    History   Social History  . Marital Status: Married    Spouse Name: N/A    Number of Children: N/A  . 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 Smoking status: Never Smoker   . Smokeless tobacco: Never Used  . Alcohol Use: 1.8 oz/week    3 Glasses of wine per week  . Drug Use: No  . Sexually Active: Not on file   Other Topics Concern  . Not on file   Social History Narrative  . No narrative on file    Past Surgical History  Procedure Date  . Left elbow     fx- cast   . Tubal ligation   . Laminectomy 10/11/11    with fusion @ L4-5    Family History  Problem Relation Age of Onset  . Ovarian cancer Mother     lung cancer  . Cancer Mother     cervical/lung  . Diabetes Father   . Heart disease Father   . Cancer Father     leukemia  . Diabetes Sister   . Cancer Sister     rectal  . Diabetes Brother   . Diabetes Brother   . Diabetes Brother   . Diabetes Sister   . Anesthesia problems Neg Hx   . Rectal cancer Sister     No Known Allergies  Current Outpatient Prescriptions on File Prior to Visit  Medication Sig Dispense Refill  . aspirin 81 MG tablet Take 81 mg by mouth daily.        . CELEBREX 200 MG capsule Take 200 mg by mouth daily as needed.       . Cholecalciferol (VITAMIN D-3 PO) Take 1,200 mg by mouth daily.      . Coenzyme Q10 (COQ-10) 150 MG  CAPS 150 mg daily.       . hydrochlorothiazide (HYDRODIURIL) 25 MG tablet TAKE  ONE TABLET BY MOUTH EVERY MORNING  90 tablet  1  . NEXIUM 40 MG capsule TAKE 1 CAPSULE ONCE A DAY  90 capsule  0     patient denies chest pain, shortness of breath, orthopnea. Denies lower extremity edema, abdominal pain, change in appetite, change in bowel movements. Patient denies rashes, musculoskeletal complaints. No other specific complaints in a complete review of systems.   BP 124/88  Pulse 72  Temp 98.3 F (36.8 C) (Oral)  Ht 5\' 8"  (1.727 m)  Wt 212 lb (96.163 kg)  BMI 32.23 kg/m2  Well-developed well-nourished female in no acute distress. HEENT exam atraumatic, normocephalic, extraocular muscles are intact. Neck is supple. No jugular venous distention no thyromegaly. Chest clear to auscultation without increased work of breathing. Cardiac exam S1 and S2 are  regular. Abdominal exam active bowel sounds, soft, nontender. Extremities no edema. Neurologic exam she is alert without any motor sensory deficits. Gait is normal.  A/p- well visit health maintenance UTD  discussed need for aggressive weight loss. She should exercise every day and follow a low calorie diet.

## 2012-04-18 ENCOUNTER — Telehealth: Payer: Self-pay | Admitting: Internal Medicine

## 2012-04-18 NOTE — Telephone Encounter (Signed)
I don't see the results in the computer.

## 2012-04-18 NOTE — Telephone Encounter (Signed)
Pt called back and was informed that results weren't in computer as noted. Pt says that the results were suppose to be faxed to Dr Cato Mulligan. Pt also said that she had CD that has the CT on it.

## 2012-04-18 NOTE — Telephone Encounter (Signed)
Pt called and had a CT done yesterday on lungs. Pt said that the results were supposed to have been sent to Dr Cato Mulligan for review. Pt said that there were spots found on lung. Pt req call back from nurse today.

## 2012-04-19 NOTE — Telephone Encounter (Signed)
Have you seen the lab results?

## 2012-04-20 ENCOUNTER — Other Ambulatory Visit: Payer: Self-pay | Admitting: Internal Medicine

## 2012-04-20 NOTE — Telephone Encounter (Signed)
Patient called back again to inquire about Ct results and requested to be reached a [phone] as she is going out of town.

## 2012-04-20 NOTE — Telephone Encounter (Signed)
Note should be scanned in chart

## 2012-04-20 NOTE — Telephone Encounter (Signed)
Patient called stating that she will bring her CD of her CT of her lungs by because she is worried about the results. Patient is having her spouse bring that over this a.m. And would like the doctor to take a look at it and call her today as she is going out of town later. Patient is anxious because her mom died of lung cancer and per pt something was seen in her lungs. Please advise and assist.

## 2012-04-20 NOTE — Telephone Encounter (Signed)
i am waiting for report

## 2012-04-23 NOTE — Telephone Encounter (Signed)
Report is scanned into the wrong place in the chart- please print out the report and send to elam to be "order level scanned"-- Having said that-- CT looks unchanged- no further f/u necessary

## 2012-04-23 NOTE — Telephone Encounter (Signed)
Left message for pt to call back  °

## 2012-04-23 NOTE — Telephone Encounter (Signed)
Report is scanned in chart.  Can you look at it?

## 2012-04-24 NOTE — Telephone Encounter (Signed)
Left message for pt to call back  °

## 2012-04-24 NOTE — Telephone Encounter (Signed)
L/m on cell phone with Dr Cato Mulligan instructions.  [place] is currently out of the country

## 2012-04-25 ENCOUNTER — Telehealth: Payer: Self-pay | Admitting: Internal Medicine

## 2012-04-25 NOTE — Telephone Encounter (Signed)
Opened in error

## 2012-05-07 ENCOUNTER — Encounter: Payer: Self-pay | Admitting: Internal Medicine

## 2012-05-12 ENCOUNTER — Other Ambulatory Visit: Payer: Self-pay | Admitting: Internal Medicine

## 2012-07-09 ENCOUNTER — Encounter: Payer: Self-pay | Admitting: Family Medicine

## 2012-07-09 ENCOUNTER — Ambulatory Visit (INDEPENDENT_AMBULATORY_CARE_PROVIDER_SITE_OTHER): Payer: BC Managed Care – PPO | Admitting: Family Medicine

## 2012-07-09 VITALS — BP 104/78 | HR 82 | Temp 97.6°F | Wt 217.0 lb

## 2012-07-09 DIAGNOSIS — IMO0002 Reserved for concepts with insufficient information to code with codable children: Secondary | ICD-10-CM

## 2012-07-09 DIAGNOSIS — L989 Disorder of the skin and subcutaneous tissue, unspecified: Secondary | ICD-10-CM

## 2012-07-09 DIAGNOSIS — G5760 Lesion of plantar nerve, unspecified lower limb: Secondary | ICD-10-CM

## 2012-07-09 MED ORDER — MUPIROCIN 2 % EX OINT: TOPICAL_OINTMENT | Freq: Two times a day (BID) | CUTANEOUS | Status: DC

## 2012-07-09 NOTE — Patient Instructions (Addendum)
For Toe: -soak in warm soapy water or warm salt water 1-2 times daily for 10-15 minutes -after soaks apply antibiotic ointment -return to clinic if worsening or not resolve din 1 week  For bump on head: -warm compresses 1-2 times daily -return if worsening or does not resolve

## 2012-07-09 NOTE — Progress Notes (Signed)
Chief Complaint  Patient presents with  . right foot and toe infection    knot that pocpped up on neck- noticed Saturday     HPI:  Foot issue: -R great toe around toe nail -started about 1 week ago -slightly painful and mildly red - OTC polysporin has helped -no fevers, chills, drainage  Also with pain between R 3rd and forth digits: -occurs when waling on hard surface -has been there for some time  "knot" - R lateral head -since yesterday -thinks maybe something bit her   ROS: See pertinent positives and negatives per HPI.  Past Medical History  Diagnosis Date  . History of colonic polyps   . Diverticulitis   . Hyperlipidemia   . Hypertension   . GERD (gastroesophageal reflux disease)   . PONV (postoperative nausea and vomiting)     states she gets deathly sick  . Asthma     had it once when she had episode of bronchitis about 10 yrs ago  . Arthritis     thinks she has some in her spine, bursitis in hips  . Anxiety   . Hepatitis     thinks she had Hepatits as a child    Family History  Problem Relation Age of Onset  . Ovarian cancer Mother     lung cancer  . Cancer Mother     cervical/lung  . Diabetes Father   . Heart disease Father   . Cancer Father     leukemia  . Diabetes Sister   . Cancer Sister     rectal  . Diabetes Brother   . Diabetes Brother   . Diabetes Brother   . Diabetes Sister   . Anesthesia problems Neg Hx   . Rectal cancer Sister     History   Social History  . Marital Status: Married    Spouse Name: N/A    Number of Children: N/A  . 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 Smoking status: Never Smoker   . Smokeless tobacco: Never Used  . Alcohol Use: 1.8 oz/week    3 Glasses of wine per week  . Drug Use: No  . Sexually Active: None   Other Topics Concern  . None   Social History Narrative  . None    Current outpatient prescriptions:aspirin 81  MG tablet, Take 81 mg by mouth daily.  , Disp: , Rfl: ;  CELEBREX 200 MG capsule, Take 200 mg by mouth daily as needed. , Disp: , Rfl: ;  Cholecalciferol (VITAMIN D-3 PO), Take 1,200 mg by mouth daily., Disp: , Rfl: ;  Coenzyme Q10 (COQ-10) 150 MG CAPS, 150 mg daily. , Disp: , Rfl: ;  hydrochlorothiazide (HYDRODIURIL) 25 MG tablet, TAKE  ONE TABLET BY MOUTH EVERY MORNING, Disp: 90 tablet, Rfl: 0 NEXIUM 40 MG capsule, TAKE 1 CAPSULE ONCE A DAY, Disp: 30 each, Rfl: 6;  mupirocin ointment (BACTROBAN) 2 %, Apply topically 2 (two) times daily. To affected area after soaking, Disp: 22 g, Rfl: 0  EXAM:  Filed Vitals:   07/09/12 0810  BP: 104/78  Pulse: 82  Temp: 97.6 F (36.4 C)    There is no height on file to calculate BMI.  GENERAL: vitals reviewed and listed above, alert, oriented, appears well hydrated and in no acute distress  MS: moves all extremities without noticeable abnormality -TTP between 3rd and 4th digits R foot near MTP  joint  SKIN: very minimal erythema near R lateral edge of great toenail, small erythematous papule R lateral head in hair line  PSYCH: pleasant and cooperative, no obvious depression or anxiety  ASSESSMENT AND PLAN:  Discussed the following assessment and plan:  1. Paronychia  mupirocin ointment (BACTROBAN) 2 %  2. Morton neuroma  Discussed options - pt prefers conservative change in footwear for now  3. Skin lesion  -insect bite versus small cyst Compresses, follow up if worsening or not improving    -Patient advised to return or notify a doctor immediately if symptoms worsen or persist or new concerns arise.  Patient Instructions  For Toe: -soak in warm soapy water or warm salt water 1-2 times daily for 10-15 minutes -after soaks apply antibiotic ointment -return to clinic if worsening or not resolve din 1 week  For bump on head: -warm compresses 1-2 times daily -return if worsening or does not resolve     KIM, HANNAH R.

## 2012-07-17 ENCOUNTER — Other Ambulatory Visit: Payer: Self-pay | Admitting: Internal Medicine

## 2012-07-18 ENCOUNTER — Telehealth: Payer: Self-pay

## 2012-07-18 MED ORDER — ESOMEPRAZOLE MAGNESIUM 40 MG PO CPDR: 40.0000 mg | DELAYED_RELEASE_CAPSULE | Freq: Every day | ORAL | Status: DC

## 2012-07-18 NOTE — Telephone Encounter (Signed)
Patient called after talking with [facility], with whom she was having difficulty getting her Nexium filled.  She asked me to call them, at their direction, and verbally give them her rx.  She also asked me to call [facility], who was directed by [facility] to give her 10 days free because of the difficulty involved in the filling of the Nexium rx.  I agreed to make the phone calls.  She will call me if she has any more problems

## 2012-07-18 NOTE — Telephone Encounter (Signed)
Called [facility] and called in 10 day supply of Nexium that [facility] said they would provide for free.  Called [facility] and gave information for Nexium Rx, 90 day supply with one refill.  [facility] confirmed that they had all the information and would fill the rx accordingly.

## 2012-07-18 NOTE — Telephone Encounter (Signed)
Left message on vm that I had refilled Nexium

## 2012-09-02 ENCOUNTER — Other Ambulatory Visit (HOSPITAL_COMMUNITY): Payer: Self-pay | Admitting: Neurological Surgery

## 2012-09-12 ENCOUNTER — Other Ambulatory Visit: Payer: Self-pay | Admitting: Internal Medicine

## 2013-01-16 ENCOUNTER — Telehealth: Payer: Self-pay | Admitting: Internal Medicine

## 2013-01-16 NOTE — Telephone Encounter (Signed)
Pt states she had a colon with Dr. [name] 01/31/12. States that [employer] is denying to pay for the anesthesia. Pt states she was told by [facility] that they would cover the colon with moderate sedation but they will needs a letter from Dr. [name] explaining why she needed the propofol. Pt states that she is going to fax the form from [place].

## 2013-01-18 NOTE — Telephone Encounter (Signed)
Pt faxed a copy of her bill from [facility]. Needs letter stating why she needed propofol for procedure so insurance company will pay for her anesthesia.

## 2013-01-19 NOTE — Telephone Encounter (Signed)
Please forward this to Cathlyn Parsons (head of the anesthesia group we use), to address. Let patient know.

## 2013-01-29 ENCOUNTER — Other Ambulatory Visit: Payer: Self-pay | Admitting: Internal Medicine

## 2013-01-31 ENCOUNTER — Telehealth: Payer: Self-pay | Admitting: Internal Medicine

## 2013-02-01 MED ORDER — ESOMEPRAZOLE MAGNESIUM 40 MG PO CPDR: 40.0000 mg | DELAYED_RELEASE_CAPSULE | Freq: Every day | ORAL | Status: DC

## 2013-02-01 NOTE — Telephone Encounter (Signed)
Refilled Nexium 

## 2013-02-12 ENCOUNTER — Ambulatory Visit: Payer: BC Managed Care – PPO | Admitting: Internal Medicine

## 2013-02-13 ENCOUNTER — Other Ambulatory Visit: Payer: Self-pay | Admitting: *Deleted

## 2013-02-13 MED ORDER — HYDROCHLOROTHIAZIDE 25 MG PO TABS: ORAL_TABLET | ORAL | Status: DC

## 2013-03-07 ENCOUNTER — Other Ambulatory Visit: Payer: Self-pay | Admitting: Dermatology

## 2013-03-08 LAB — HM PAP SMEAR

## 2013-03-09 ENCOUNTER — Other Ambulatory Visit: Payer: Self-pay | Admitting: Internal Medicine

## 2013-03-20 ENCOUNTER — Telehealth: Payer: Self-pay | Admitting: Internal Medicine

## 2013-03-20 NOTE — Telephone Encounter (Signed)
Pt CPX is being bumped from 04/03/13. Although we have plenty of slots for next week, she cannot be scheduled there due to the fact that her CPX wouldn't be a year old yet. Should I bring her in for a "med check" and still have labs done ahead? Please assist?

## 2013-03-20 NOTE — Telephone Encounter (Signed)
Med check ok-- i'll do labs at time of OV if necessary

## 2013-03-21 NOTE — Telephone Encounter (Signed)
Lmovm/  ga x2 numbers

## 2013-03-27 ENCOUNTER — Ambulatory Visit (INDEPENDENT_AMBULATORY_CARE_PROVIDER_SITE_OTHER): Payer: BC Managed Care – PPO | Admitting: Internal Medicine

## 2013-03-27 ENCOUNTER — Encounter: Payer: Self-pay | Admitting: Internal Medicine

## 2013-03-27 VITALS — BP 114/84 | HR 68 | Temp 98.6°F | Ht 67.5 in | Wt 200.0 lb

## 2013-03-27 DIAGNOSIS — Z0000 Encounter for general adult medical examination without abnormal findings: Secondary | ICD-10-CM

## 2013-03-27 LAB — POCT URINALYSIS DIPSTICK
Blood, UA: NEGATIVE
Glucose, UA: NEGATIVE
Ketones, UA: NEGATIVE
Spec Grav, UA: 1.015
Urobilinogen, UA: 0.2

## 2013-03-27 LAB — CBC WITH DIFFERENTIAL/PLATELET
Basophils Relative: 0.6 % (ref 0.0–3.0)
Eosinophils Absolute: 0.1 10*3/uL (ref 0.0–0.7)
Eosinophils Relative: 2 % (ref 0.0–5.0)
Lymphocytes Relative: 48.4 % — ABNORMAL HIGH (ref 12.0–46.0)
MCHC: 33.2 g/dL (ref 30.0–36.0)
Neutrophils Relative %: 43.5 % (ref 43.0–77.0)
RBC: 4.82 Mil/uL (ref 3.87–5.11)
WBC: 7.3 10*3/uL (ref 4.5–10.5)

## 2013-03-27 LAB — HEPATIC FUNCTION PANEL
ALT: 17 U/L (ref 0–35)
Total Protein: 7.3 g/dL (ref 6.0–8.3)

## 2013-03-27 LAB — LIPID PANEL
Cholesterol: 197 mg/dL (ref 0–200)
HDL: 51.8 mg/dL (ref >39.00)
VLDL: 16 mg/dL (ref 0.0–40.0)

## 2013-03-27 LAB — BASIC METABOLIC PANEL
BUN: 13 mg/dL (ref 6–23)
Calcium: 9.9 mg/dL (ref 8.4–10.5)
GFR: 76.72 mL/min (ref >60.00)
Glucose, Bld: 106 mg/dL — ABNORMAL HIGH (ref 70–99)
Potassium: 5 mEq/L (ref 3.5–5.1)

## 2013-03-27 MED ORDER — HYDROCHLOROTHIAZIDE 25 MG PO TABS: 12.5000 mg | ORAL_TABLET | Freq: Every day | ORAL | Status: DC

## 2013-03-27 NOTE — Progress Notes (Signed)
cpx  Past Medical History  Diagnosis Date  . History of colonic polyps   . Diverticulitis   . Hyperlipidemia   . Hypertension   . GERD (gastroesophageal reflux disease)   . PONV (postoperative nausea and vomiting)     states she gets deathly sick  . Asthma     had it once when she had episode of bronchitis about 10 yrs ago  . Arthritis     thinks she has some in her spine, bursitis in hips  . Anxiety   . Hepatitis     thinks she had Hepatits as a child    History   Social History  . Marital Status: Married    Spouse Name: N/A    Number of Children: N/A  . 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 Smoking status: Never Smoker   . Smokeless tobacco: Never Used  . Alcohol Use: 1.8 oz/week    3 Glasses of wine per week  . Drug Use: No  . Sexual Activity: Not on file   Other Topics Concern  . Not on file   Social History Narrative  . No narrative on file    Past Surgical History  Procedure Laterality Date  . Left elbow      fx- cast   . Tubal ligation    . Laminectomy  10/11/11    with fusion @ L4-5    Family History  Problem Relation Age of Onset  . Ovarian cancer Mother     lung cancer  . Cancer Mother     cervical/lung  . Diabetes Father   . Heart disease Father   . Cancer Father     leukemia  . Diabetes Sister   . Cancer Sister     rectal  . Diabetes Brother   . Diabetes Brother   . Diabetes Brother   . Diabetes Sister   . Anesthesia problems Neg Hx   . Rectal cancer Sister     No Known Allergies  Current Outpatient Prescriptions on File Prior to Visit  Medication Sig Dispense Refill  . CELEBREX 200 MG capsule Take 200 mg by mouth daily as needed.       . Cholecalciferol (VITAMIN D-3 PO) Take 1,200 mg by mouth daily.      . Coenzyme Q10 (COQ-10) 150 MG CAPS 150 mg daily.       Marland Kitchen esomeprazole (NEXIUM) 40 MG capsule Take 1 capsule (40 mg total) by mouth daily.  90 capsule   0  . hydrochlorothiazide (HYDRODIURIL) 25 MG tablet Take one tablet by mouth   every morning  30 tablet  0   No current facility-administered medications on file prior to visit.     patient denies chest pain, shortness of breath, orthopnea. Denies lower extremity edema, abdominal pain, change in appetite, change in bowel movements. Patient denies rashes, musculoskeletal complaints. No other specific complaints in a complete review of systems.   BP 114/84  Pulse 68  Temp(Src) 98.6 F (37 C) (Oral)  Ht 5' 7.5" (1.715 m)  Wt 200 lb (90.719 kg)  BMI 30.84 kg/m2  Well-developed well-nourished female in no acute distress. HEENT exam atraumatic, normocephalic, extraocular muscles are intact. Neck is supple. No jugular venous distention no thyromegaly. Chest clear to auscultation without increased work of breathing. Cardiac exam S1 and S2 are regular. Abdominal exam active bowel sounds, soft, nontender. Extremities no edema. Neurologic exam  she is alert without any motor sensory deficits. Gait is normal.  Well visit- health maint utd congratualted on weigh tloss-- goal bmi< 25

## 2013-03-29 ENCOUNTER — Other Ambulatory Visit: Payer: BC Managed Care – PPO

## 2013-04-01 ENCOUNTER — Encounter: Payer: Self-pay | Admitting: Internal Medicine

## 2013-04-03 ENCOUNTER — Encounter: Payer: BC Managed Care – PPO | Admitting: Internal Medicine

## 2013-04-24 ENCOUNTER — Encounter (INDEPENDENT_AMBULATORY_CARE_PROVIDER_SITE_OTHER): Payer: BC Managed Care – PPO | Admitting: General Surgery

## 2013-05-07 ENCOUNTER — Other Ambulatory Visit: Payer: Self-pay | Admitting: Obstetrics and Gynecology

## 2013-05-09 ENCOUNTER — Ambulatory Visit (INDEPENDENT_AMBULATORY_CARE_PROVIDER_SITE_OTHER): Payer: BC Managed Care – PPO | Admitting: General Surgery

## 2013-05-09 ENCOUNTER — Encounter (INDEPENDENT_AMBULATORY_CARE_PROVIDER_SITE_OTHER): Payer: Self-pay | Admitting: General Surgery

## 2013-05-09 VITALS — BP 128/88 | HR 64 | Temp 99.4°F | Resp 14 | Ht 67.5 in | Wt 196.0 lb

## 2013-05-09 DIAGNOSIS — D013 Carcinoma in situ of anus and anal canal: Secondary | ICD-10-CM

## 2013-05-09 MED ORDER — HYDROCORTISONE ACETATE 25 MG RE SUPP: 25.0000 mg | Freq: Two times a day (BID) | RECTAL | Status: DC

## 2013-05-09 NOTE — Progress Notes (Signed)
Chief complaint: Rectal bleeding and history of anal dysplasia  History:   Patient returns to the office do to some recent intermittent rectal bleeding. She has a significant history of anal dysplasia found a colonoscopyin 2006. A small polyp was removed showing high-grade atypia. She had a sister also with squamous cancer of the anus. Following this we did an exam under anesthesia with multiple biopsies showing only low-grade dysplasia and HPV effect. She has had annual and then by annual followup since. Her sister unfortunately recently passed away from a heart attack. The patient has noted some intermittent constipation. Colonoscopy was negative in June. Since this summer she has noted some intermittent small amount of red blood with bowel movements. This never occurred more than once a week and she is actually not had any bleeding in about 6 weeks. She denies any pain or eructations or itching or mass. No prolapsing tissue.  Past Medical History  Diagnosis Date  . History of colonic polyps   . Diverticulitis   . Hyperlipidemia   . Hypertension   . GERD (gastroesophageal reflux disease)   . PONV (postoperative nausea and vomiting)     states she gets deathly sick  . Asthma     had it once when she had episode of bronchitis about 10 yrs ago  . Arthritis     thinks she has some in her spine, bursitis in hips  . Anxiety   . Hepatitis     thinks she had Hepatits as a child   Past Surgical History  Procedure Laterality Date  . Left elbow      fx- cast   . Tubal ligation    . Laminectomy  10/11/11    with fusion @ L4-5  . Back surgery      l4-l5   Current Outpatient Prescriptions  Medication Sig Dispense Refill  . CELEBREX 200 MG capsule Take 200 mg by mouth daily as needed.       . Cholecalciferol (VITAMIN D-3 PO) Take 1,200 mg by mouth daily.      . Coenzyme Q10 (COQ-10) 150 MG CAPS 150 mg daily.       Marland Kitchen doxycycline (VIBRA-TABS) 100 MG tablet       . esomeprazole (NEXIUM) 40 MG  capsule Take 1 capsule (40 mg total) by mouth daily.  90 capsule  0  . fluticasone (CUTIVATE) 0.05 % cream       . hydrochlorothiazide (HYDRODIURIL) 25 MG tablet Take 0.5 tablets (12.5 mg total) by mouth daily.  30 tablet  0  . valACYclovir (VALTREX) 500 MG tablet        No current facility-administered medications for this visit.   No Known Allergies   Exam: BP 128/88  Pulse 64  Temp(Src) 99.4 F (37.4 C) (Temporal)  Resp 14  Ht 5' 7.5" (1.715 m)  Wt 196 lb (88.905 kg)  BMI 30.23 kg/m2 General: Appears well Abdomen: Soft and nontender. No mass or tenderness Rectal: External rectal exam is unremarkable without any external hemorrhoids or evidence of fissure or anal dysplasia.  Anoscopy was performed and with good visualization of the anal canal. She has some small to moderate internal hemorrhoids. No bleeding. Anoderm and rectal mucosa appeared otherwise completely normal.  Assessment and plan: History of anal dysplasia as above and family history of renal cancer. Rare intermittent painless rectal bleeding. Only mild internal hemorrhoids seen on endoscopy. No evidence of anal neoplasia clinically. She is encouraged to use a stool softener daily and will prescription  for Anusol suppositories when necessary. If her bleeding worsens she is to call and otherwise I will see her for routine followup in one year.

## 2013-05-09 NOTE — Patient Instructions (Addendum)
Using a stool softener daily. Use suppositories as needed for bleeding. Call if this does not help or symptoms worsen.

## 2013-06-13 ENCOUNTER — Other Ambulatory Visit: Payer: Self-pay

## 2013-06-28 ENCOUNTER — Ambulatory Visit (INDEPENDENT_AMBULATORY_CARE_PROVIDER_SITE_OTHER): Payer: BC Managed Care – PPO | Admitting: Family Medicine

## 2013-06-28 ENCOUNTER — Encounter: Payer: Self-pay | Admitting: Family Medicine

## 2013-06-28 VITALS — BP 118/78 | HR 115 | Temp 97.9°F | Wt 200.0 lb

## 2013-06-28 DIAGNOSIS — M79609 Pain in unspecified limb: Secondary | ICD-10-CM

## 2013-06-28 DIAGNOSIS — M79604 Pain in right leg: Secondary | ICD-10-CM

## 2013-06-28 NOTE — Progress Notes (Signed)
Pre visit review using our clinic review tool, if applicable. No additional management support is needed unless otherwise documented below in the visit note. 

## 2013-06-28 NOTE — Patient Instructions (Signed)
Follow up for any lower extremity numbness, weakness, or any progressive pain. Also let us know if you have any increased leg edema.

## 2013-06-28 NOTE — Progress Notes (Signed)
  Subjective:    Patient ID: Cynthia Padilla, female    DOB: 12/17/1950, 62 y.o.   MRN: [id number]  HPI Patient seen with intermittent right leg pain. This tends to occur at night. Location is mostly lateral lower right leg. She's not any edema. She describes as a tightening sensation occasional burning sensation. She has never had any claudication symptoms. Her symptoms are frequently worse with movement. She has never had history of restless legs and does not describe any sensation of abnormal movement. She rates her pain 4 out of 5/10 in intensity at its worst. She has incidentally noted that when she has massages her symptoms seem to be less. She is not describing any muscle cramps.  Patient had lumbar back surgery March 2013 and had right radiculopathy symptoms then. Thesw symptoms are different. She denies any current back pain. No lower extremity numbness or weakness.   Review of Systems  Constitutional: Negative for appetite change and unexpected weight change.  Neurological: Negative for weakness and numbness.       Objective:   Physical Exam  Constitutional: She appears well-developed and well-nourished.  Cardiovascular: Normal rate.   Pulmonary/Chest: Effort normal and breath sounds normal. No respiratory distress. She has no wheezes. She has no rales.  Musculoskeletal: She exhibits no edema.  2+ bounding pulses right dorsalis pedis and posterior tibial. Right foot is warm to touch with good capillary refill. Right leg reveals no edema. No calf or leg tenderness. Straight leg raise negative  Neurological:  Patient has symmetric reflexes knee and ankle. Full-strength lower extremity          Assessment & Plan:  Right leg pain. Question neuropathic. Nonfocal neurologic exam. Her current symptoms do not suggest any PAD or muscle cramp. Doubt restless leg syndrome and her symptoms are only involving one leg. We discussed options including possible trial of gabapentin versus  further studies with possible peripheral nerve conductions and she prefers to watch and observe at this time. She'll keep a diary of symptoms

## 2013-07-09 ENCOUNTER — Telehealth: Payer: Self-pay | Admitting: Internal Medicine

## 2013-07-09 MED ORDER — ESOMEPRAZOLE MAGNESIUM 40 MG PO CPDR: 40.0000 mg | DELAYED_RELEASE_CAPSULE | Freq: Every day | ORAL | Status: DC

## 2013-07-09 NOTE — Telephone Encounter (Signed)
Refilled Nexium 

## 2013-07-11 ENCOUNTER — Telehealth: Payer: Self-pay

## 2013-07-11 NOTE — Telephone Encounter (Signed)
Erroneous encounter - Rx already filled. 

## 2013-07-15 ENCOUNTER — Telehealth: Payer: Self-pay | Admitting: Internal Medicine

## 2013-07-15 ENCOUNTER — Encounter: Payer: Self-pay | Admitting: Internal Medicine

## 2013-07-15 ENCOUNTER — Ambulatory Visit (INDEPENDENT_AMBULATORY_CARE_PROVIDER_SITE_OTHER): Payer: BC Managed Care – PPO | Admitting: Internal Medicine

## 2013-07-15 VITALS — BP 118/88 | HR 73 | Temp 97.6°F | Wt 196.0 lb

## 2013-07-15 DIAGNOSIS — K219 Gastro-esophageal reflux disease without esophagitis: Secondary | ICD-10-CM

## 2013-07-15 DIAGNOSIS — Z8249 Family history of ischemic heart disease and other diseases of the circulatory system: Secondary | ICD-10-CM

## 2013-07-15 DIAGNOSIS — R0789 Other chest pain: Secondary | ICD-10-CM

## 2013-07-15 MED ORDER — PANTOPRAZOLE SODIUM 40 MG PO TBEC: 40.0000 mg | DELAYED_RELEASE_TABLET | Freq: Every day | ORAL | Status: DC

## 2013-07-15 NOTE — Telephone Encounter (Signed)
opened in error

## 2013-07-15 NOTE — Telephone Encounter (Deleted)
FYI

## 2013-07-15 NOTE — Telephone Encounter (Signed)
Patient Information:  Caller Name: Jesslynn  Phone: [phone]  Patient: Cynthia Padilla  Gender: Female  DOB: Jun 10, 1951  Age: 62 Years  PCP: Birdie Sons (Adults only)  Office Follow Up:  Does the office need to follow up with this patient?: No  Instructions For The Office: N/A  RN Note:  Pt reports mid chest pressure over the past several weeks. The feeling is worse at night. Breathing easy, no radiating pain. Hx reflux.  Symptoms  Reason For Call & Symptoms: Mid chest pain  Reviewed Health History In EMR: Yes  Reviewed Medications In EMR: Yes  Reviewed Allergies In EMR: Yes  Reviewed Surgeries / Procedures: No  Date of Onset of Symptoms: Unknown  Guideline(s) Used:  Chest Pain  Disposition Per Guideline:   Go to ED Now (or to Office with PCP Approval)  Reason For Disposition Reached:   Chest pain lasting longer than 5 minutes  Advice Given:  N/A  Patient Will Follow Care Advice:  YES  Appointment Scheduled:  07/15/2013 15:15:00 Appointment Scheduled Provider:  Berniece Andreas (Family Practice)

## 2013-07-15 NOTE — Patient Instructions (Addendum)
Your ekg is normal . As well as exam . Context and pattern of  Your discomfort acts like esophageal or gi cause . Change medication and get appt with dr [name] in the next 3-4 weeks  For follow up. Contact us if we need to facilitate this .   You can add  Ranitidine also 150 at night in the interim   To boost acid blocking  Effect .  Contact us if any worsening or concern for other problem      Diet for Gastroesophageal Reflux Disease, Adult Reflux (acid reflux) is when acid from your stomach flows up into the esophagus. When acid comes in contact with the esophagus, the acid causes irritation and soreness (inflammation) in the esophagus. When reflux happens often or so severely that it causes damage to the esophagus, it is called gastroesophageal reflux disease (GERD). Nutrition therapy can help ease the discomfort of GERD. FOODS OR DRINKS TO AVOID OR LIMIT  Smoking or chewing tobacco. Nicotine is one of the most potent stimulants to acid production in the gastrointestinal tract.  Caffeinated and decaffeinated coffee and black tea.  Regular or low-calorie carbonated beverages or energy drinks (caffeine-free carbonated beverages are allowed).   Strong spices, such as black pepper, white pepper, red pepper, cayenne, curry powder, and chili powder.  Peppermint or spearmint.  Chocolate.  High-fat foods, including meats and fried foods. Extra added fats including oils, butter, salad dressings, and nuts. Limit these to less than 8 tsp per day.  Fruits and vegetables if they are not tolerated, such as citrus fruits or tomatoes.  Alcohol.  Any food that seems to aggravate your condition. If you have questions regarding your diet, call your caregiver or a registered dietitian. OTHER THINGS THAT MAY HELP GERD INCLUDE:   Eating your meals slowly, in a relaxed setting.  Eating 5 to 6 small meals per day instead of 3 large meals.  Eliminating food for a period of time if it causes  distress.  Not lying down until 3 hours after eating a meal.  Keeping the head of your bed raised 6 to 9 inches (15 to 23 cm) by using a foam wedge or blocks under the legs of the bed. Lying flat may make symptoms worse.  Being physically active. Weight loss may be helpful in reducing reflux in overweight or obese adults.  Wear loose fitting clothing EXAMPLE MEAL PLAN This meal plan is approximately 2,000 calories based on https://www.[facility].org/ meal planning guidelines. Breakfast   cup cooked oatmeal.  1 cup strawberries.  1 cup low-fat milk.  1 oz almonds. Snack  1 cup cucumber slices.  6 oz yogurt (made from low-fat or fat-free milk). Lunch  2 slice whole-wheat bread.  2 oz sliced [place].  2 tsp mayonnaise.  1 cup blueberries.  1 cup snap peas. Snack  6 whole-wheat crackers.  1 oz string cheese. Dinner   cup brown rice.  1 cup mixed veggies.  1 tsp olive oil.  3 oz grilled fish. Document Released: 07/25/2005 Document Revised: 10/17/2011 Document Reviewed: 06/10/2011 [facility] Patient Information 2014 [place], [place].

## 2013-07-15 NOTE — Progress Notes (Addendum)
Chief Complaint  Patient presents with  . Chest discomfort    Ongoing for several weeks.    HPI: Patient comes in today for SDA for  new problem evaluation. pcp NA. Sent in by CAN.  Newer sensation for weeks  Of chest pressure   Mid lower sternal area   ? Burping   Esp after eating Stress at work  No assoc sx  Sob or other  Nop leuritic pain  Continuous pain discomfort worse at night .  When lays down . Taking nexium 40 qd  rx for years  .  10 123 .  Endo years ago.  Pressure Worse after eating . No dysphagia  No vomiting change bowels  Months    Was on Doxy with chalazion   rx   For 10 days.  About October  Or early November  Of this month . Hast taken celebrex in a while  Fam hx : Sib had  Mi and 2 broth with cabg  Sis passes from dm and MI sx   ROS: See pertinent positives and negatives per HPI. Hx of leg cramps nocturnal but no sob cough swelling or edema vv .   Past Medical History  Diagnosis Date  . History of colonic polyps   . Diverticulitis   . Hyperlipidemia   . Hypertension   . GERD (gastroesophageal reflux disease)   . PONV (postoperative nausea and vomiting)     states she gets deathly sick  . Asthma     had it once when she had episode of bronchitis about 10 yrs ago  . Arthritis     thinks she has some in her spine, bursitis in hips  . Anxiety   . Hepatitis     thinks she had Hepatits as a child    Family History  Problem Relation Age of Onset  . Ovarian cancer Mother     lung cancer  . Cancer Mother     cervical/lung  . Diabetes Father   . Heart disease Father   . Cancer Father     leukemia  . Diabetes Sister   . Cancer Sister     rectal  . Diabetes Brother   . Diabetes Brother   . Diabetes Brother   . Diabetes Sister   . Anesthesia problems Neg Hx   . Rectal cancer Sister     History   Social History  . Marital Status: Married    Spouse Name: N/A    Number of Children: N/A  . Years of Education: N/A   Occupational History  . HR  Manager [employer]   Social History Main Topics  . Smoking status: Never Smoker   . Smokeless tobacco: Never Used  . Alcohol Use: 1.8 oz/week    3 Glasses of wine per week  . Drug Use: No  . Sexual Activity: None   Other Topics Concern  . None   Social History Narrative  . None    Outpatient Encounter Prescriptions as of 07/15/2013  Medication Sig  . Cholecalciferol (VITAMIN D-3 PO) Take 1,200 mg by mouth daily.  . Coenzyme Q10 (COQ-10) 150 MG CAPS 150 mg daily.   Marland Kitchen esomeprazole (NEXIUM) 40 MG capsule Take 1 capsule (40 mg total) by mouth daily.  . hydrochlorothiazide (HYDRODIURIL) 25 MG tablet Take 0.5 tablets (12.5 mg total) by mouth daily.  . CELEBREX 200 MG capsule Take 200 mg by mouth daily as needed.   . pantoprazole (PROTONIX) 40 MG tablet Take 1  tablet (40 mg total) by mouth daily.  . [DISCONTINUED] doxycycline (VIBRA-TABS) 100 MG tablet   . [DISCONTINUED] fluticasone (CUTIVATE) 0.05 % cream   . [DISCONTINUED] hydrocortisone (ANUSOL-HC) 25 MG suppository Place 1 suppository (25 mg total) rectally 2 (two) times daily.    EXAM:  BP 118/88  Pulse 73  Temp(Src) 97.6 F (36.4 C) (Oral)  Wt 196 lb (88.905 kg)  SpO2 96%  Body mass index is 30.23 kg/(m^2).  GENERAL: vitals reviewed and listed above, alert, oriented, appears well hydrated and in no acute distress  HEENT: atraumatic, conjunctiva  clear, no obvious abnormalities on inspection of external nose and ears OP : no lesion edema or exudate  NECK: no obvious masses on inspection palpation  No adenopathy  LUNGS: clear to auscultation bilaterally, no wheezes, rales or rhonchi, good air movement CV: HRRR, no clubbing cyanosis or  peripheral edema nl cap refill  MS: moves all extremities without noticeable focal  abnormality PSYCH: pleasant and cooperative, no obvious depression or anxiety EKG NSR  ASSESSMENT AND PLAN:  Discussed the following assessment and plan:  Chest discomfort - suspect esophageal G  related  protinix diet changes add ranititidne get appt dr [name] as discussed  - Plan: EKG 12-Lead  Chest pressure  GERD  Family history of heart disease  -Patient advised to return or notify health care team  if symptoms worsen or persist or new concerns arise.  Patient Instructions  Your ekg is normal . As well as exam . Context and pattern of  Your discomfort acts like esophageal or gi cause . Change medication and get appt with dr [name] in the next 3-4 weeks  For follow up. Contact us if we need to facilitate this .   You can add  Ranitidine also 150 at night in the interim   To boost acid blocking  Effect .  Contact us if any worsening or concern for other problem      Diet for Gastroesophageal Reflux Disease, Adult Reflux (acid reflux) is when acid from your stomach flows up into the esophagus. When acid comes in contact with the esophagus, the acid causes irritation and soreness (inflammation) in the esophagus. When reflux happens often or so severely that it causes damage to the esophagus, it is called gastroesophageal reflux disease (GERD). Nutrition therapy can help ease the discomfort of GERD. FOODS OR DRINKS TO AVOID OR LIMIT  Smoking or chewing tobacco. Nicotine is one of the most potent stimulants to acid production in the gastrointestinal tract.  Caffeinated and decaffeinated coffee and black tea.  Regular or low-calorie carbonated beverages or energy drinks (caffeine-free carbonated beverages are allowed).   Strong spices, such as black pepper, white pepper, red pepper, cayenne, curry powder, and chili powder.  Peppermint or spearmint.  Chocolate.  High-fat foods, including meats and fried foods. Extra added fats including oils, butter, salad dressings, and nuts. Limit these to less than 8 tsp per day.  Fruits and vegetables if they are not tolerated, such as citrus fruits or tomatoes.  Alcohol.  Any food that seems to aggravate your condition. If you have  questions regarding your diet, call your caregiver or a registered dietitian. OTHER THINGS THAT MAY HELP GERD INCLUDE:   Eating your meals slowly, in a relaxed setting.  Eating 5 to 6 small meals per day instead of 3 large meals.  Eliminating food for a period of time if it causes distress.  Not lying down until 3 hours after eating a meal.  Keeping the head of your bed raised 6 to 9 inches (15 to 23 cm) by using a foam wedge or blocks under the legs of the bed. Lying flat may make symptoms worse.  Being physically active. Weight loss may be helpful in reducing reflux in overweight or obese adults.  Wear loose fitting clothing EXAMPLE MEAL PLAN This meal plan is approximately 2,000 calories based on https://www.bernard.org/ meal planning guidelines. Breakfast   cup cooked oatmeal.  1 cup strawberries.  1 cup low-fat milk.  1 oz almonds. Snack  1 cup cucumber slices.  6 oz yogurt (made from low-fat or fat-free milk). Lunch  2 slice whole-wheat bread.  2 oz sliced [place].  2 tsp mayonnaise.  1 cup blueberries.  1 cup snap peas. Snack  6 whole-wheat crackers.  1 oz string cheese. Dinner   cup brown rice.  1 cup mixed veggies.  1 tsp olive oil.  3 oz grilled fish. Document Released: 07/25/2005 Document Revised: 10/17/2011 Document Reviewed: 06/10/2011 [employer] Patient Information 2014 [place], [place].      Neta Mends. Romelo Sciandra M.D.  Pre visit review using our clinic review tool, if applicable. No additional management support is needed unless otherwise documented below in the visit note.

## 2013-07-18 ENCOUNTER — Encounter: Payer: Self-pay | Admitting: Physician Assistant

## 2013-07-18 ENCOUNTER — Ambulatory Visit (INDEPENDENT_AMBULATORY_CARE_PROVIDER_SITE_OTHER): Payer: BC Managed Care – PPO | Admitting: Physician Assistant

## 2013-07-18 ENCOUNTER — Other Ambulatory Visit (INDEPENDENT_AMBULATORY_CARE_PROVIDER_SITE_OTHER): Payer: BC Managed Care – PPO

## 2013-07-18 VITALS — BP 120/80 | HR 68 | Ht 67.5 in | Wt 195.2 lb

## 2013-07-18 DIAGNOSIS — K219 Gastro-esophageal reflux disease without esophagitis: Secondary | ICD-10-CM

## 2013-07-18 DIAGNOSIS — R079 Chest pain, unspecified: Secondary | ICD-10-CM

## 2013-07-18 DIAGNOSIS — K209 Esophagitis, unspecified without bleeding: Secondary | ICD-10-CM

## 2013-07-18 DIAGNOSIS — R1013 Epigastric pain: Secondary | ICD-10-CM

## 2013-07-18 MED ORDER — PANTOPRAZOLE SODIUM 40 MG PO TBEC: 40.0000 mg | DELAYED_RELEASE_TABLET | Freq: Two times a day (BID) | ORAL | Status: DC

## 2013-07-18 NOTE — Progress Notes (Signed)
Agree with initial assessment and plans 

## 2013-07-18 NOTE — Progress Notes (Signed)
Subjective:    Patient ID: Cynthia Padilla, female    DOB: April 19, 1951, 62 y.o.   MRN: [id number]  HPI Cynthia Padilla is a pleasant 62 year old white female known to Dr. Delsa Sale who has history of GERD and diverticular disease. She had remote EGD done in 2006 which was a normal exam and colonoscopy in June of 2013 with finding of moderate left-sided diverticulosis internal hemorrhoids and had a diminutive polyp removed from a descending colon. Path on this polyp was a sessile serrated polyp and she is slated for a 5 year interval followup. She comes in today with complaints of lower chest and subxiphoid discomfort which has been present over the past 3 weeks. She says this is been worse postprandially and associated with increased burping and belching. She has been on Nexium chronically for years for her reflux symptoms and on further questioning the only thing different that she had done in the past couple of months was take a course of doxycycline at the end of October. She says she's also been trying to lose weight and has been drinking protein smoothies. She's not been on any other new medications. She does take occasional Celebrex and occasional Aleve which she generally uses at bedtime. She says she's been having a lot of problems with cramping in her lower extremities at night time waking her up. She also mentions that she heard that PPIs can cause low magnesium levels and wonders if this may be her problem and contribute into her leg cramps.  She denies any abdominal pain her appetite has been fine she's had no changes in her bowel habits melena or hematochezia no fever or chills. She does have strong family history of gallbladder disease. She had seen her PCP last week and was told to stay on the Nexium in the morning and then added Protonix 40 mg every afternoon. She says she's only done this over the last 3 days and hasn't seen any difference thus far. She denies any dysphagia or  odynophagia.    Review of Systems  Constitutional: Negative.   HENT: Negative.   Eyes: Negative.   Respiratory: Negative.   Cardiovascular: Positive for chest pain.  Gastrointestinal: Positive for abdominal pain.  Endocrine: Negative.   Genitourinary: Negative.   Musculoskeletal: Negative.   Allergic/Immunologic: Negative.   Neurological: Negative.   Hematological: Negative.   Psychiatric/Behavioral: Negative.    Outpatient Prescriptions Prior to Visit  Medication Sig Dispense Refill  . Cholecalciferol (VITAMIN D-3 PO) Take 1,200 mg by mouth daily.      . Coenzyme Q10 (COQ-10) 150 MG CAPS 150 mg daily.       Marland Kitchen esomeprazole (NEXIUM) 40 MG capsule Take 1 capsule (40 mg total) by mouth daily.  90 capsule  1  . hydrochlorothiazide (HYDRODIURIL) 25 MG tablet Take 0.5 tablets (12.5 mg total) by mouth daily.  30 tablet  0  . pantoprazole (PROTONIX) 40 MG tablet Take 1 tablet (40 mg total) by mouth daily.  30 tablet  3  . CELEBREX 200 MG capsule Take 200 mg by mouth daily as needed.        No facility-administered medications prior to visit.   No Known Allergies Patient Active Problem List   Diagnosis Date Noted  . Chest discomfort 07/15/2013  . Pulmonary nodule 04/02/2012  . 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 Hyperglycemia 04/01/2011  . HYPERLIPIDEMIA 10/11/2007  . HYPERTENSION 10/11/2007  . GERD 10/11/2007  . COLONIC POLYPS, HX OF 10/11/2007  . DIVERTICULOSIS, COLON 11/04/2004  History  Substance Use Topics  . Smoking status: Never Smoker   . Smokeless tobacco: Never Used  . Alcohol Use: 1.8 oz/week    3 Glasses of wine per week   family history includes Diabetes in her brother, father, and sister; Heart disease in her father; Leukemia in her father; Lung cancer in her mother; Ovarian cancer in her mother; Rectal cancer in her sister. There is no history of Colon cancer, Throat cancer, Kidney disease, Anesthesia problems, or Liver disease.     Objective:    Physical Exam  well-developed white female in no acute distress, pleasant blood pressure 120/80 pulse 68 height 5 foot 7 weight 195. HEENT; nontraumatic normocephalic EOMI PERRLA sclera anicteric, Supple; no JVD, Cardiovascular; regular rate and rhythm with S1-S2 no murmur or gallop, Pulmonary; clear bilaterally, Abdomen; soft she is tender in the epigastrium and subxiphoid area there is no guarding or rebound no palpable mass or hepatosplenomegaly bowel sounds are present, Rectal; exam not done, ;Ext;no clubbing cyanosis or edema skin warm and dry, Psych; mood and affect normal and appropriate        Assessment & Plan:  #14 62 year old female with history of chronic GERD now presenting with 3 week history of subxiphoid discomfort and fullness without dysphagia or odynophagia. I suspect she did develop a distal esophagitis after taking doxycycline. She had also been taking NSAIDs which may have been contributing. Cannot rule out gallbladder disease but feel less likely #2 diverticulosis #3 history of colon polyps last colonoscopy June 2013 #4 history of dysplasia of the anal canal followed by surgery  Plan; Will switch to proton next and increased to Protonix 40 mg by mouth twice daily x1 month then once daily in a.m. thereafter Stop all NSAIDs Discussed anti reflux regimen  schedule for upper abdominal ultrasound. Will consider EGD if her symptoms do not improve in the next 10-14 days

## 2013-07-18 NOTE — Patient Instructions (Addendum)
Please go to the basement level to have your labs drawn, magnesium level. We sent a prescription  For Protonix ( pantoprazole sodium ) twice daily for 1 month then go to once daily. [facility]. We scheduled the Ultrasound at [facility]. Go to the reception desk and ask for directions to Radiology, 1st floor.   Stay off all inflammatories , including Celebrex.  You can take Tylenol or Extra strength Tylenol.

## 2013-07-22 ENCOUNTER — Encounter: Payer: Self-pay | Admitting: *Deleted

## 2013-07-24 ENCOUNTER — Ambulatory Visit (HOSPITAL_COMMUNITY)
Admission: RE | Admit: 2013-07-24 | Discharge: 2013-07-24 | Disposition: A | Discharge status: Home / Self Care | Payer: BC Managed Care – PPO | Source: Ambulatory Visit | Attending: Physician Assistant | Admitting: Physician Assistant

## 2013-07-24 DIAGNOSIS — R079 Chest pain, unspecified: Secondary | ICD-10-CM | POA: Insufficient documentation

## 2013-07-24 DIAGNOSIS — R1013 Epigastric pain: Secondary | ICD-10-CM | POA: Insufficient documentation

## 2013-07-24 IMAGING — US US ABDOMEN COMPLETE
1 series · 14 of 25 positions shown · non-contrast
Comparison: None.

CLINICAL DATA: Epigastric abdominal pain.  Chest pain.

EXAM:
ULTRASOUND ABDOMEN COMPLETE

[Series 1: us abdomen complete · 0.27mm/px · 14 of 78 slices shown]
[im 1/78]
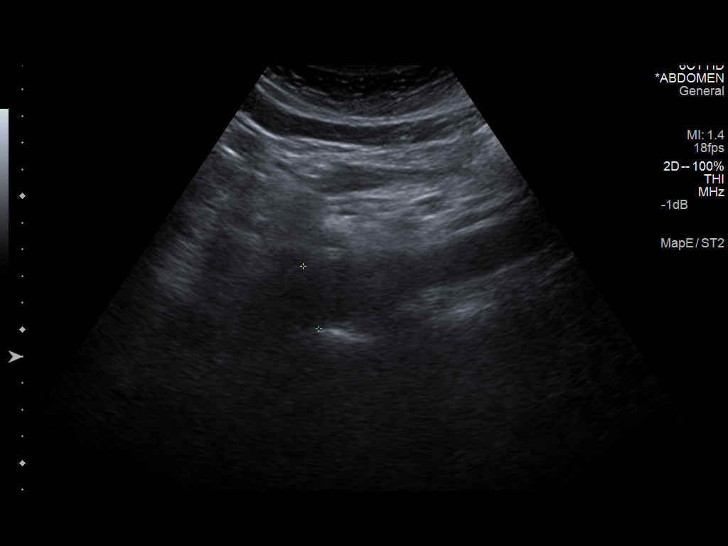
[im 7/78]
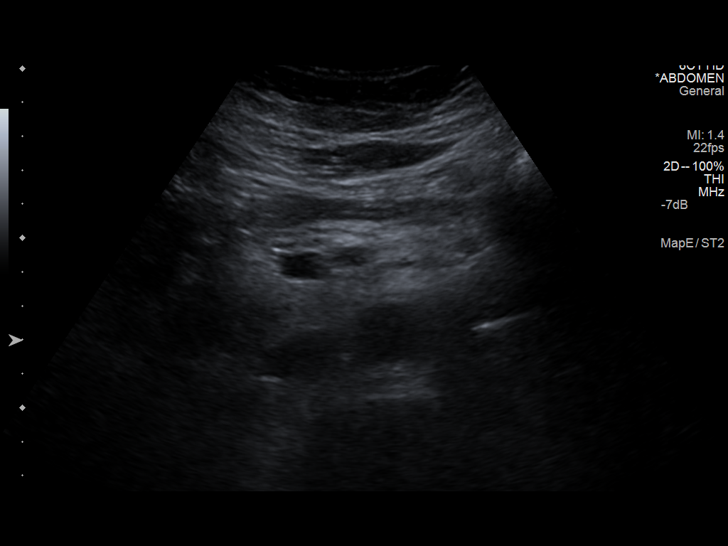
[im 13/78]
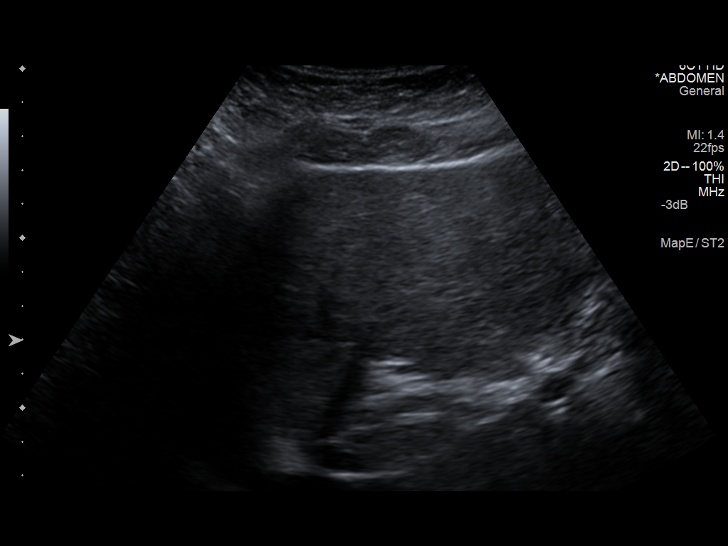
[im 20/78]
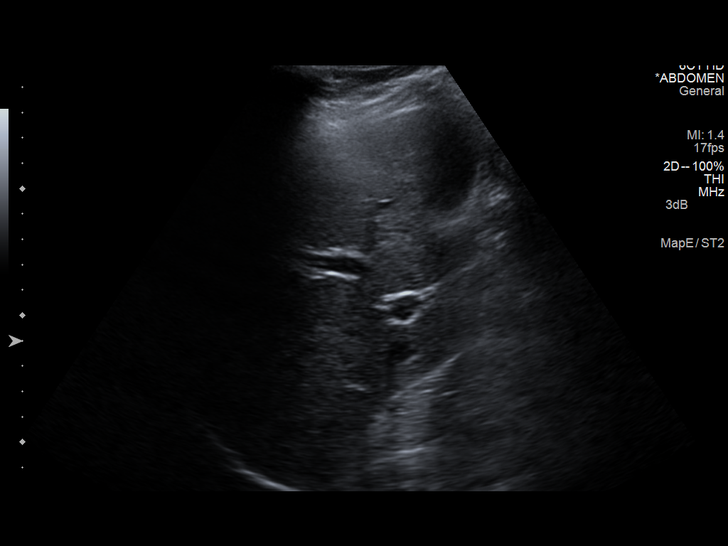
[im 26/78]
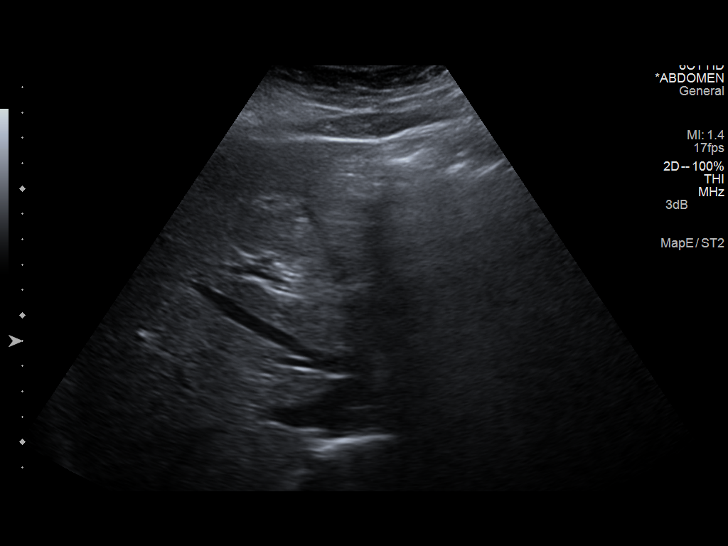
[im 29/78]
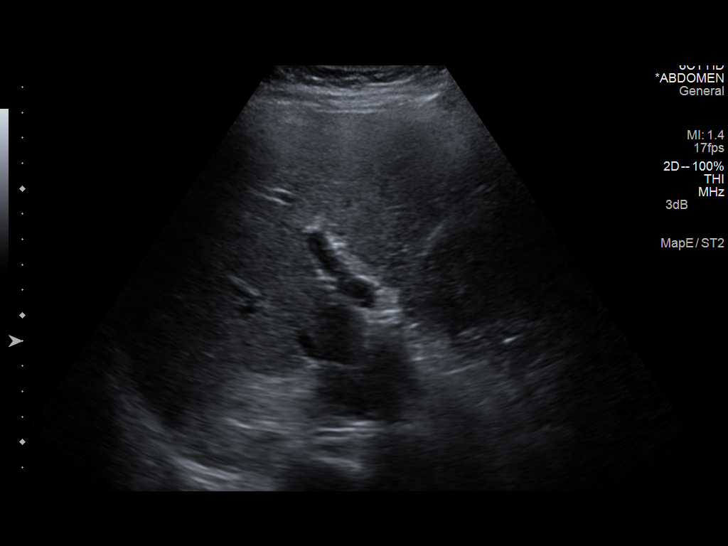
[im 36/78]
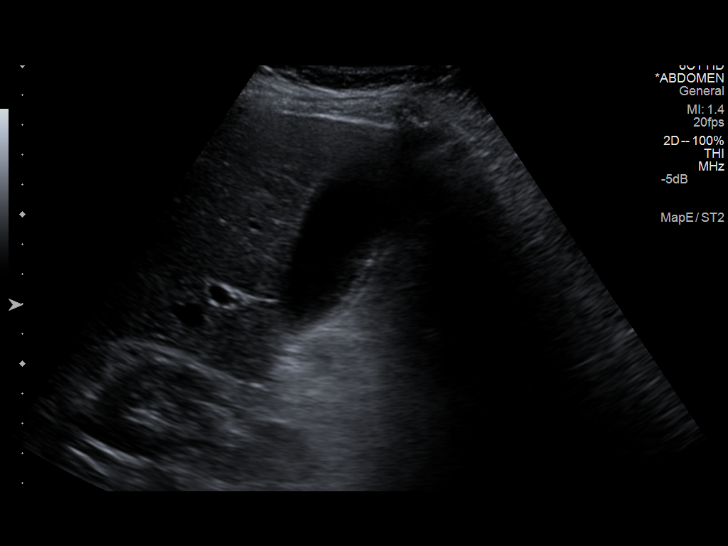
[im 42/78]
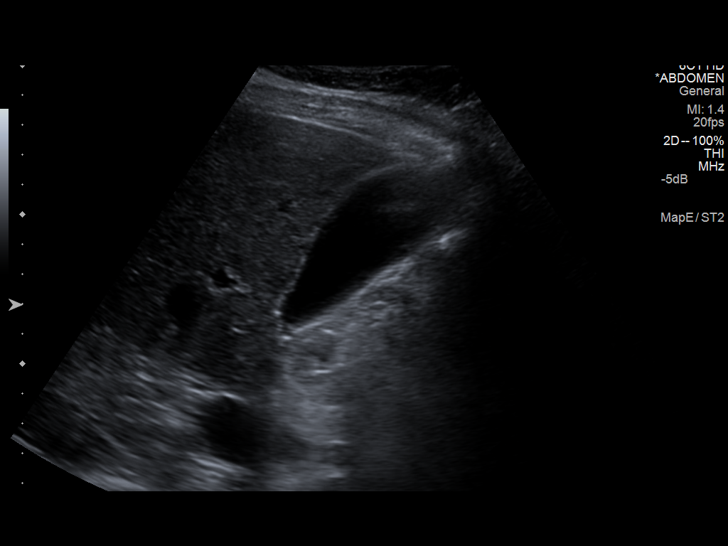
[im 49/78]
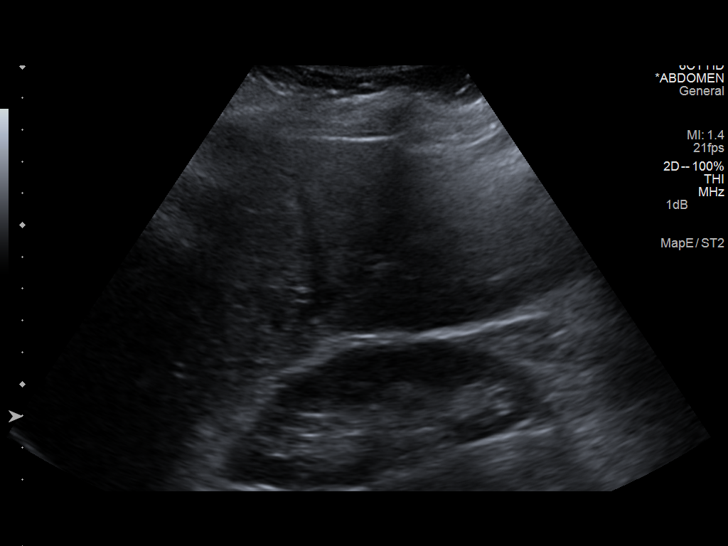
[im 52/78]
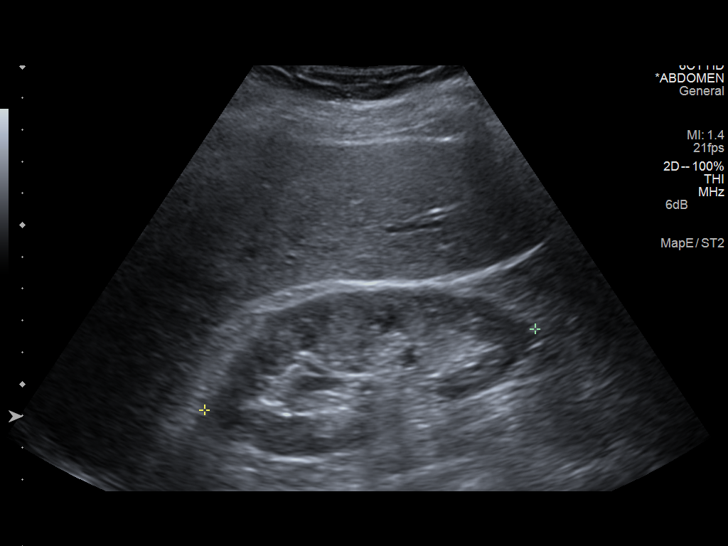
[im 58/78]
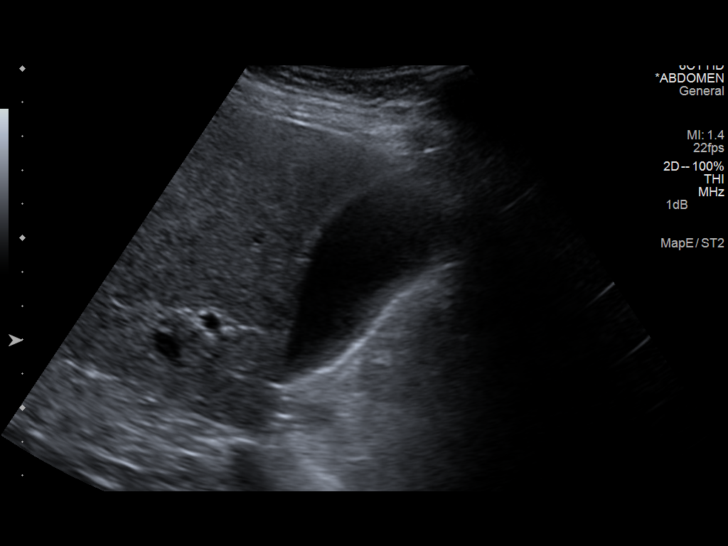
[im 65/78]
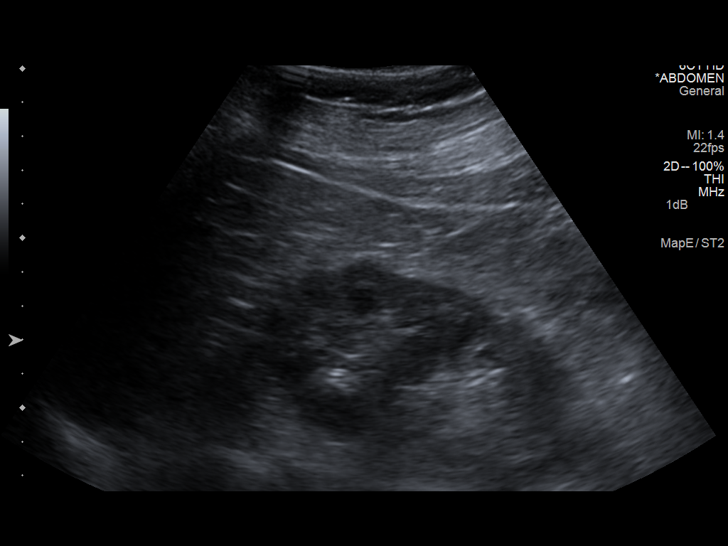
[im 71/78]
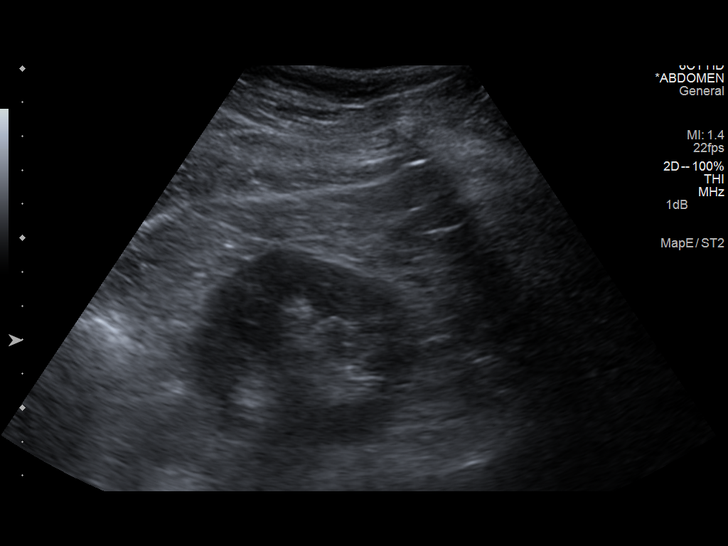
[im 78/78]
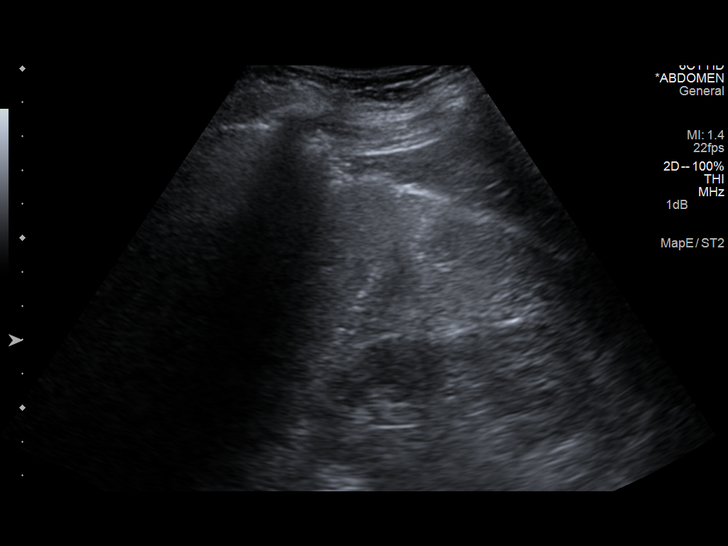

[14 of 25 positions shown; findings below may reference images not displayed]

FINDINGS: Gallbladder:

No gallstones or wall thickening visualized. No sonographic Murphy
sign noted.

Common bile duct:

Diameter: 3 mm

Liver:

No focal lesion identified. Within normal limits in parenchymal
echogenicity.

IVC:

No abnormality visualized.

Pancreas:

Visualized portion unremarkable.

Spleen:

Size and appearance within normal limits.

Right Kidney:

Length: 10.7 cm. Echogenicity within normal limits. No mass or
hydronephrosis visualized.

Left Kidney:

Length: 10.6 cm. Echogenicity within normal limits. No mass or
hydronephrosis visualized.

Abdominal aorta:

No aneurysm visualized.

Other findings:

None.
IMPRESSION: Negative. No acute findings or other significant abnormality
visualized.

## 2013-08-08 HISTORY — PX: RETINAL TEAR REPAIR CRYOTHERAPY: SHX5304

## 2013-08-20 ENCOUNTER — Ambulatory Visit: Payer: BC Managed Care – PPO | Admitting: Internal Medicine

## 2013-11-02 ENCOUNTER — Other Ambulatory Visit: Payer: Self-pay | Admitting: Physician Assistant

## 2013-11-14 ENCOUNTER — Other Ambulatory Visit: Payer: Self-pay | Admitting: Dermatology

## 2013-11-29 ENCOUNTER — Other Ambulatory Visit: Payer: Self-pay | Admitting: Internal Medicine

## 2014-01-29 ENCOUNTER — Other Ambulatory Visit (INDEPENDENT_AMBULATORY_CARE_PROVIDER_SITE_OTHER): Payer: BC Managed Care – PPO

## 2014-01-29 DIAGNOSIS — Z0000 Encounter for general adult medical examination without abnormal findings: Secondary | ICD-10-CM

## 2014-01-29 LAB — HEPATIC FUNCTION PANEL
ALBUMIN: 4.6 g/dL (ref 3.5–5.2)
ALT: 16 U/L (ref 0–35)
AST: 26 U/L (ref 0–37)
Alkaline Phosphatase: 65 U/L (ref 39–117)
Bilirubin, Direct: 0 mg/dL (ref 0.0–0.3)
Total Bilirubin: 0.6 mg/dL (ref 0.2–1.2)
Total Protein: 7.2 g/dL (ref 6.0–8.3)

## 2014-01-29 LAB — CBC WITH DIFFERENTIAL/PLATELET
Basophils Absolute: 0.1 10*3/uL (ref 0.0–0.1)
Basophils Relative: 0.9 % (ref 0.0–3.0)
Eosinophils Absolute: 0.1 10*3/uL (ref 0.0–0.7)
Eosinophils Relative: 2.1 % (ref 0.0–5.0)
HCT: 43 % (ref 36.0–46.0)
Hemoglobin: 14.4 g/dL (ref 12.0–15.0)
Lymphocytes Relative: 47.7 % — ABNORMAL HIGH (ref 12.0–46.0)
Lymphs Abs: 3.3 10*3/uL (ref 0.7–4.0)
MCHC: 33.4 g/dL (ref 30.0–36.0)
MCV: 93.3 fl (ref 78.0–100.0)
Monocytes Absolute: 0.5 10*3/uL (ref 0.1–1.0)
Monocytes Relative: 6.8 % (ref 3.0–12.0)
Neutro Abs: 2.9 10*3/uL (ref 1.4–7.7)
Neutrophils Relative %: 42.5 % — ABNORMAL LOW (ref 43.0–77.0)
Platelets: 247 10*3/uL (ref 150.0–400.0)
RBC: 4.61 Mil/uL (ref 3.87–5.11)
RDW: 13.1 % (ref 11.5–15.5)
WBC: 6.9 10*3/uL (ref 4.0–10.5)

## 2014-01-29 LAB — LIPID PANEL
CHOLESTEROL: 196 mg/dL (ref 0–200)
HDL: 59.2 mg/dL (ref >39.00)
LDL Cholesterol: 117 mg/dL — ABNORMAL HIGH (ref 0–99)
NonHDL: 136.8
TRIGLYCERIDES: 101 mg/dL (ref 0.0–149.0)
Total CHOL/HDL Ratio: 3
VLDL: 20.2 mg/dL (ref 0.0–40.0)

## 2014-01-29 LAB — POCT URINALYSIS DIPSTICK
Bilirubin, UA: NEGATIVE
Blood, UA: NEGATIVE
Glucose, UA: NEGATIVE
Ketones, UA: NEGATIVE
Leukocytes, UA: NEGATIVE
NITRITE UA: NEGATIVE
Protein, UA: NEGATIVE
SPEC GRAV UA: 1.01
UROBILINOGEN UA: 0.2
pH, UA: 6

## 2014-01-29 LAB — BASIC METABOLIC PANEL
BUN: 13 mg/dL (ref 6–23)
CO2: 28 mEq/L (ref 19–32)
Calcium: 9.7 mg/dL (ref 8.4–10.5)
Chloride: 104 mEq/L (ref 96–112)
Creatinine, Ser: 0.7 mg/dL (ref 0.4–1.2)
GFR: 87.82 mL/min (ref >60.00)
Glucose, Bld: 112 mg/dL — ABNORMAL HIGH (ref 70–99)
Potassium: 4.3 mEq/L (ref 3.5–5.1)
Sodium: 141 mEq/L (ref 135–145)

## 2014-01-29 LAB — TSH: TSH: 1.27 u[IU]/mL (ref 0.35–4.50)

## 2014-02-03 ENCOUNTER — Encounter: Payer: BC Managed Care – PPO | Admitting: Internal Medicine

## 2014-02-04 ENCOUNTER — Encounter: Payer: Self-pay | Admitting: Internal Medicine

## 2014-02-04 ENCOUNTER — Ambulatory Visit (INDEPENDENT_AMBULATORY_CARE_PROVIDER_SITE_OTHER): Payer: BC Managed Care – PPO | Admitting: Internal Medicine

## 2014-02-04 VITALS — BP 122/88 | HR 76 | Temp 98.1°F | Ht 67.75 in | Wt 209.0 lb

## 2014-02-04 DIAGNOSIS — Z23 Encounter for immunization: Secondary | ICD-10-CM

## 2014-02-04 DIAGNOSIS — Z0000 Encounter for general adult medical examination without abnormal findings: Secondary | ICD-10-CM

## 2014-02-04 NOTE — Progress Notes (Signed)
cpx  Past Medical History  Diagnosis Date  . History of colonic polyps   . Diverticulitis   . Hyperlipidemia   . Hypertension   . GERD (gastroesophageal reflux disease)   . PONV (postoperative nausea and vomiting)     states she gets deathly sick  . Asthma     had it once when she had episode of bronchitis about 10 yrs ago  . Arthritis     thinks she has some in her spine, bursitis in hips  . Anxiety   . Hepatitis     thinks she had Hepatits as a child    History   Social History  . Marital Status: Married    Spouse Name: N/A    Number of Children: N/A  . 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 Smoking status: Never Smoker   . Smokeless tobacco: Never Used  . Alcohol Use: 1.8 oz/week    3 Glasses of wine per week  . Drug Use: No  . Sexual Activity: Not on file   Other Topics Concern  . Not on file   Social History Narrative  . No narrative on file    Past Surgical History  Procedure Laterality Date  . Elbow fracture surgery Left   . Tubal ligation    . Laminectomy  10/11/11    with fusion @ L4-5    Family History  Problem Relation Age of Onset  . Ovarian cancer Mother   . Lung cancer Mother   . Diabetes Father   . Heart disease Father   . Leukemia Father   . Diabetes Sister     x 2  . Rectal cancer Sister     anal  . Diabetes Brother     x 3  . Colon cancer Neg Hx   . Throat cancer Neg Hx   . Kidney disease Neg Hx   . Anesthesia problems Neg Hx   . Liver disease Neg Hx     No Known Allergies  Current Outpatient Prescriptions on File Prior to Visit  Medication Sig Dispense Refill  . CELEBREX 200 MG capsule Take 200 mg by mouth daily as needed.       . Cholecalciferol (VITAMIN D-3 PO) Take 1,200 mg by mouth daily.      . Coenzyme Q10 (COQ-10) 150 MG CAPS 150 mg daily.       Marland Kitchen esomeprazole (NEXIUM) 40 MG capsule Take 1 capsule (40 mg total) by mouth daily.  90 capsule  1   . hydrochlorothiazide (HYDRODIURIL) 25 MG tablet TAKE ONE TABLET BY MOUTH EVERY DAY IN THE MORNING.   30 tablet  5   No current facility-administered medications on file prior to visit.     patient denies chest pain, shortness of breath, orthopnea. Denies lower extremity edema, abdominal pain, change in appetite, change in bowel movements. Patient denies rashes, musculoskeletal complaints. No other specific complaints in a complete review of systems.   BP 122/88  Pulse 76  Temp(Src) 98.1 F (36.7 C) (Oral)  Ht 5' 7.75" (1.721 m)  Wt 209 lb (94.802 kg)  BMI 32.01 kg/m2  Well-developed well-nourished female in no acute distress. HEENT exam atraumatic, normocephalic, extraocular muscles are intact. Neck is supple. No jugular venous distention no thyromegaly. Chest clear to auscultation without increased work of breathing. Cardiac exam S1 and S2 are regular. Abdominal exam active bowel sounds, soft, nontender. Extremities no  edema. Neurologic exam she is alert without any motor sensory deficits. Gait is normal.   Well visit- health maint utd Weight-- goal bmi< 25 Note blood sugar- has fluctuated over the years.

## 2014-02-04 NOTE — Progress Notes (Signed)
Pre visit review using our clinic review tool, if applicable. No additional management support is needed unless otherwise documented below in the visit note. 

## 2014-02-06 ENCOUNTER — Encounter: Payer: Self-pay | Admitting: Internal Medicine

## 2014-02-06 ENCOUNTER — Telehealth: Payer: Self-pay | Admitting: Internal Medicine

## 2014-02-06 NOTE — Telephone Encounter (Signed)
Cynthia Padilla/patient Phone [phone] Called regarding Tetanus injection 02/04/14; site is swollen and red.  No contact at time of call back. Left message to call if assistance still needed.

## 2014-02-06 NOTE — Telephone Encounter (Signed)
Attempted call back to pt.  NA/unable to leave [facility]

## 2014-02-12 ENCOUNTER — Telehealth: Payer: Self-pay | Admitting: Internal Medicine

## 2014-02-12 NOTE — Telephone Encounter (Signed)
Pt would like a cb w/ ekg results

## 2014-02-14 NOTE — Telephone Encounter (Signed)
Left normal results on pts cell phone

## 2014-02-14 NOTE — Telephone Encounter (Signed)
ekg is normal.

## 2014-03-04 ENCOUNTER — Other Ambulatory Visit: Payer: Self-pay | Admitting: Internal Medicine

## 2014-03-05 ENCOUNTER — Telehealth: Payer: Self-pay | Admitting: Internal Medicine

## 2014-03-05 MED ORDER — PANTOPRAZOLE SODIUM 40 MG PO TBEC
40.0000 mg | DELAYED_RELEASE_TABLET | Freq: Every day | ORAL | Status: DC
Start: 2014-03-05 — End: 2014-03-06

## 2014-03-05 NOTE — Telephone Encounter (Signed)
Refilled Protonix 

## 2014-03-06 ENCOUNTER — Ambulatory Visit (INDEPENDENT_AMBULATORY_CARE_PROVIDER_SITE_OTHER): Payer: BC Managed Care – PPO | Admitting: Family Medicine

## 2014-03-06 ENCOUNTER — Encounter: Payer: Self-pay | Admitting: Family Medicine

## 2014-03-06 VITALS — BP 112/90 | HR 64 | Temp 97.8°F | Wt 212.0 lb

## 2014-03-06 DIAGNOSIS — R42 Dizziness and giddiness: Secondary | ICD-10-CM

## 2014-03-06 LAB — GLUCOSE, POCT (MANUAL RESULT ENTRY): POC GLUCOSE: 118 mg/dL — AB (ref 70–99)

## 2014-03-06 LAB — CBC WITH DIFFERENTIAL/PLATELET
BASOS PCT: 0.4 % (ref 0.0–3.0)
Basophils Absolute: 0 10*3/uL (ref 0.0–0.1)
EOS PCT: 1.2 % (ref 0.0–5.0)
Eosinophils Absolute: 0.1 10*3/uL (ref 0.0–0.7)
HCT: 42.2 % (ref 36.0–46.0)
Hemoglobin: 14.1 g/dL (ref 12.0–15.0)
LYMPHS PCT: 34.8 % (ref 12.0–46.0)
Lymphs Abs: 3.4 10*3/uL (ref 0.7–4.0)
MCHC: 33.3 g/dL (ref 30.0–36.0)
MCV: 92.6 fl (ref 78.0–100.0)
Monocytes Absolute: 0.5 10*3/uL (ref 0.1–1.0)
Monocytes Relative: 4.9 % (ref 3.0–12.0)
NEUTROS ABS: 5.7 10*3/uL (ref 1.4–7.7)
NEUTROS PCT: 58.7 % (ref 43.0–77.0)
Platelets: 250 10*3/uL (ref 150.0–400.0)
RBC: 4.56 Mil/uL (ref 3.87–5.11)
RDW: 12.9 % (ref 11.5–15.5)
WBC: 9.7 10*3/uL (ref 4.0–10.5)

## 2014-03-06 LAB — COMPREHENSIVE METABOLIC PANEL
ALBUMIN: 4.4 g/dL (ref 3.5–5.2)
ALT: 19 U/L (ref 0–35)
AST: 26 U/L (ref 0–37)
Alkaline Phosphatase: 64 U/L (ref 39–117)
BUN: 18 mg/dL (ref 6–23)
CALCIUM: 9.7 mg/dL (ref 8.4–10.5)
CHLORIDE: 101 meq/L (ref 96–112)
CO2: 26 meq/L (ref 19–32)
CREATININE: 0.8 mg/dL (ref 0.4–1.2)
GFR: 78.76 mL/min (ref >60.00)
GLUCOSE: 138 mg/dL — AB (ref 70–99)
POTASSIUM: 3.9 meq/L (ref 3.5–5.1)
Sodium: 137 mEq/L (ref 135–145)
Total Bilirubin: 0.4 mg/dL (ref 0.2–1.2)
Total Protein: 7.3 g/dL (ref 6.0–8.3)

## 2014-03-06 LAB — POCT HEMOGLOBIN: HEMOGLOBIN: 13.6 g/dL (ref 12.2–16.2)

## 2014-03-06 LAB — TSH: TSH: 1.13 u[IU]/mL (ref 0.35–4.50)

## 2014-03-06 MED ORDER — MECLIZINE HCL 25 MG PO TABS: 25.0000 mg | ORAL_TABLET | Freq: Three times a day (TID) | ORAL | Status: DC | PRN

## 2014-03-06 NOTE — Patient Instructions (Addendum)
Lightheadedness  Your heart rate increases by over 20 with standing indicating that your blood pressure is low with standing  Your blood sugar, hemoglobin, and EKG were fine.   I believe your lightheadedness is due to mild dehydration along with your blood pressure medicine  Stop the hydrochlorothiazide  We gave you an 81mg  aspirin based off age for stroke protection. You should take one of these daily.   See me back tomorrow  Hydrate aggressively today (32 oz gatorade bottle in a glass mixed 1/2 and 1/2 with water). Drink at least 64 oz if not more today.   If your symptoms worsen or fail to improve with the above regimen, please seek care in the emergency room this evening.

## 2014-03-06 NOTE — Progress Notes (Addendum)
Laying BP 130/80 HR 57 Sitting 118/84 Standing 122/90 HR 80

## 2014-03-06 NOTE — Assessment & Plan Note (Addendum)
Initially thought symptoms due to orthostatic hypotension given lightheadedness with standing and patient denying room spinning as well as HR increase 23 with standing. Planned to push fluid and d/c HCTZ. Unremarkable neuro exam so central pathology not ruled out but unlikely. Dizziness improved with resting in chair and drinking fluids. Patient does have risk factors for cardiac disease (HLD, HTN) and has some fatigue but with normal EKG, we discussed MI or arrythmia unlikely (though not completely ruled out). Also plan for CBC, CMET, TSH. Labs largely unremarkable as noted above.   Before patient left, she turned her head to me and became acutely dizzy. I performed a dix-hallpike which caused several beats of horizontal nystagmus after about 15 seconds. Patient complained of room spinning at this point and when she sat up she threw up. These symptoms seemed more vertiginous so we added meclizine. Planned follow up in 1 day. Discussed risk that this could be MI or CVA though thought highly unlikely. Could be BPPV but symptoms last minutes (though head movement as precipitator makes this high in my differential). Possibly vestibular neuritis though episodes seem to be positional and not sustained. No tinnitus to suggest Meniere's.  If symptoms did not improve with meclizine and hydration and d/c HCTZ, plan was to seek care in ED this evening.  We also made decision based on age to start aspirin 81 mg which was given in office. If symptoms not improved with above therapy, would consider MRI.

## 2014-03-06 NOTE — Progress Notes (Signed)
Garret Reddish, MD Phone: [phone]  Subjective:   Cynthia Padilla is a 63 y.o. year old very pleasant female patient who presents with the following:  Dizziness Patient states over last few weeks she has had intermittent periods of feeling lightheaded with standing. Today, she was at her dentist and when she sat up from her cleaning, she became very dizzy. She states she felt a little off before laying down. She denies recent illness or sickness. She got in her car hoping it would go away and drove to work. Her symptoms persisted so she decided to call her office.   At the office, she describes periods of about 5 minutes of dizziness, describes lightheadedness without using word dizzy and initially denied room spinning. She gets mild nausea with this. Denies ear pain or pressure. Denies extremity weakness, word slurring, facial droop, difficulty swallowing. The episodes are provoked when she gets up from laying or when she stands up. She does take HCTZ and took it this morning. Episodes feel similar but more severe than previous episodes of dizziness in last few weeks. Denies feeling of presyncope. She does states she feels more tired than usual. Denies tinnitus. She has had laser surgery for floaters and flashing lights by optho recently but since the procedure the flashes of light have resolved and was told floaters would persist.   ROS- Denies chest pain, palpitations, shortness of breath, diaphoresis. No unilateral leg swelling.   Past Medical History- HLD, HTn, GERD, diverticulosis  Medications- reviewed and updated Current Outpatient Prescriptions  Medication Sig Dispense Refill  . CELEBREX 200 MG capsule Take 200 mg by mouth daily as needed.       . Cholecalciferol (VITAMIN D-3 PO) Take 1,200 mg by mouth daily.      . Coenzyme Q10 (COQ-10) 150 MG CAPS 150 mg daily.       Marland Kitchen esomeprazole (NEXIUM) 40 MG capsule Take 1 capsule (40 mg total) by mouth daily.  90 capsule  1  .  hydrochlorothiazide (HYDRODIURIL) 25 MG tablet TAKE ONE TABLET BY MOUTH EVERY DAY IN THE MORNING.   30 tablet  5   Objective: BP 112/90  Pulse 64  Temp(Src) 97.8 F (36.6 C)  Wt 212 lb (96.163 kg) Gen: NAD, resting comfortably in chair when not moving and not dizzy HEENT: turbinates normal, pharynx normal without pharyngeal exudate, TM normal bilaterally, mildly dry mucus membranes.   CV: RRR no mrg  Lungs: CTAB  Abd: soft/nontender/nondistended/normal bowel sounds  MSK: moves all extremities, no edema  Skin: warm and dry, no rash  Neuro: CN II-XII intact, sensation and reflexes normal throughout, 5/5 muscle strength in bilateral upper and lower extremities. Normal finger to nose. Normal rapid alternating movements. No pronator drift. Negative romberg.   POC CBG 118, hgb 13.6 EKG-sinus bradycardia rate 57, normal axis, normal intervals, no hypertrophy, nonspecific st t wave changes with flattening in v2 unchanged from previous.  With orthostatics- SBP decreased by 8 only but HR increased 23  Results for orders placed in visit on 03/06/14 (from the past 24 hour(s))  POCT HEMOGLOBIN     Status: None   Collection Time    03/06/14 11:49 AM      Result Value Ref Range   Hemoglobin 13.6  12.2 - 16.2 g/dL  GLUCOSE, POCT (MANUAL RESULT ENTRY)     Status: Abnormal   Collection Time    03/06/14 11:50 AM      Result Value Ref Range   POC Glucose 118 (*) 70 -  99 mg/dl  TSH     Status: None   Collection Time    03/06/14 12:13 PM      Result Value Ref Range   TSH 1.13  0.35 - 4.50 uIU/mL  CBC WITH DIFFERENTIAL     Status: None   Collection Time    03/06/14 12:13 PM      Result Value Ref Range   WBC 9.7  4.0 - 10.5 K/uL   RBC 4.56  3.87 - 5.11 Mil/uL   Hemoglobin 14.1  12.0 - 15.0 g/dL   HCT 42.2  36.0 - 46.0 %   MCV 92.6  78.0 - 100.0 fl   MCHC 33.3  30.0 - 36.0 g/dL   RDW 12.9  11.5 - 15.5 %   Platelets 250.0  150.0 - 400.0 K/uL   Neutrophils Relative % 58.7  43.0 - 77.0 %    Lymphocytes Relative 34.8  12.0 - 46.0 %   Monocytes Relative 4.9  3.0 - 12.0 %   Eosinophils Relative 1.2  0.0 - 5.0 %   Basophils Relative 0.4  0.0 - 3.0 %   Neutro Abs 5.7  1.4 - 7.7 K/uL   Lymphs Abs 3.4  0.7 - 4.0 K/uL   Monocytes Absolute 0.5  0.1 - 1.0 K/uL   Eosinophils Absolute 0.1  0.0 - 0.7 K/uL   Basophils Absolute 0.0  0.0 - 0.1 K/uL  COMPREHENSIVE METABOLIC PANEL     Status: Abnormal   Collection Time    03/06/14 12:13 PM      Result Value Ref Range   Sodium 137  135 - 145 mEq/L   Potassium 3.9  3.5 - 5.1 mEq/L   Chloride 101  96 - 112 mEq/L   CO2 26  19 - 32 mEq/L   Glucose, Bld 138 (*) 70 - 99 mg/dL   BUN 18  6 - 23 mg/dL   Creatinine, Ser 0.8  0.4 - 1.2 mg/dL   Total Bilirubin 0.4  0.2 - 1.2 mg/dL   Alkaline Phosphatase 64  39 - 117 U/L   AST 26  0 - 37 U/L   ALT 19  0 - 35 U/L   Total Protein 7.3  6.0 - 8.3 g/dL   Albumin 4.4  3.5 - 5.2 g/dL   Calcium 9.7  8.4 - 10.5 mg/dL   GFR 78.76  >60.00 mL/min     Assessment/Plan:  Dizziness Initially thought symptoms due to orthostatic hypotension given lightheadedness with standing and patient denying room spinning as well as HR increase 23 with standing. Planned to push fluid and d/c HCTZ. Unremarkable neuro exam so central pathology not ruled out but unlikely. Dizziness improved with resting in chair and drinking fluids. Patient does have risk factors for cardiac disease (HLD, HTN) and has some fatigue but with normal EKG, we discussed MI or arrythmia unlikely (though not completely ruled out). Also plan for CBC, CMET, TSH. Labs largely unremarkable as noted above.   Before patient left, she turned her head to me and became acutely dizzy. I performed a dix-hallpike which caused several beats of horizontal nystagmus after about 15 seconds. Patient complained of room spinning at this point and when she sat up she threw up. These symptoms seemed more vertiginous so we added meclizine. Planned follow up in 1 day. Discussed  risk that this could be MI or CVA though thought highly unlikely. Could be BPPV but symptoms last minutes (though head movement as precipitator makes this high in my differential).  Possibly vestibular neuritis though episodes seem to be positional and not sustained. No tinnitus to suggest Meniere's.  If symptoms did not improve with meclizine and hydration and d/c HCTZ, plan was to seek care in ED this evening.  We also made decision based on age to start aspirin 81 mg which was given in office. If symptoms not improved with above therapy, would consider MRI.   Orders Placed This Encounter  Procedures  . TSH    [place]  . CBC with Differential  . Comprehensive metabolic panel    [place]  . POC Hemoglobin  . POC Glucose (CBG)  . EKG 12-Lead    Meds ordered this encounter  Medications  . meclizine (ANTIVERT) 25 MG tablet    Sig: Take 1 tablet (25 mg total) by mouth 3 (three) times daily as needed.    Dispense:  30 tablet    Refill:  0   >50% of 45 minute office visit was spent on counseling (discussion of potential risks of discharge home including MI and CVA though thought unlikely, meaning of orthostatic hypotension and vertigo, reassurance during fearful periods for patient with dizziness) and coordination of care (multiple return visits to check on patient while resting, coordinating labs to be drawn in room). I started seeing patient at 11:00 AM and finally discharged her from office at 12:40 PM with at least 45 minutes of this spent in face to face time.

## 2014-03-07 ENCOUNTER — Encounter: Payer: Self-pay | Admitting: Family Medicine

## 2014-03-07 ENCOUNTER — Ambulatory Visit (INDEPENDENT_AMBULATORY_CARE_PROVIDER_SITE_OTHER): Payer: BC Managed Care – PPO | Admitting: Family Medicine

## 2014-03-07 VITALS — BP 140/86 | HR 72 | Temp 98.6°F | Ht 67.75 in | Wt 206.3 lb

## 2014-03-07 DIAGNOSIS — E785 Hyperlipidemia, unspecified: Secondary | ICD-10-CM

## 2014-03-07 DIAGNOSIS — Z86010 Personal history of colonic polyps: Secondary | ICD-10-CM

## 2014-03-07 DIAGNOSIS — H8110 Benign paroxysmal vertigo, unspecified ear: Secondary | ICD-10-CM

## 2014-03-07 DIAGNOSIS — R42 Dizziness and giddiness: Secondary | ICD-10-CM

## 2014-03-07 DIAGNOSIS — I10 Essential (primary) hypertension: Secondary | ICD-10-CM

## 2014-03-07 DIAGNOSIS — K219 Gastro-esophageal reflux disease without esophagitis: Secondary | ICD-10-CM

## 2014-03-07 DIAGNOSIS — H8113 Benign paroxysmal vertigo, bilateral: Secondary | ICD-10-CM

## 2014-03-07 NOTE — Assessment & Plan Note (Signed)
Orthostatic symptoms have resolved off of HCTZ. This was likely contributing to symptoms.  Patient also likely with BPPV given symptoms only occuring with head turning. Discussed TIA or CVA but discussed not likely given focus of symptoms with head turning and symptoms not persistent otherwise. Still encouraged daily aspirin given age.  Referred to vestibular rehab and can continue meclizine as needed.

## 2014-03-07 NOTE — Progress Notes (Signed)
  Garret Reddish, MD Phone: [phone]  Subjective:   Cynthia Padilla is a 63 y.o. year old very pleasant female patient who presents with the following:  Dizziness and vertigo Patient seen yesterday. Thought to have orthostatic hypotension from mild dehdration and HCTZ. Also at end of visit, discovered to have vertiginous symptoms as well. Patient states she hydrated well yesterday and did not take HCTZ this morning. Her symptoms of lightheadedness with standing have essentially resolved as long as she keeps her head straight while standing. The room spinning that was experienced with dix-hallpike maneuver yesterday has occurred 2-3 x since she left and typically lasts about a few minutes or less. Most recently, she turned her head in bed this morning and it occurred. Outside of these head movement episodes, she experiences no room spinning/vertigo or dizziness. She states the meclizine was very helpful in relieving symptoms. She no longer feels as fatigued as yesterday.   Hypertension- off of HCTZ today. No headache or blurry vision.   ROS- Denies chest pain, palpitations, shortness of breath, diaphoresis. No unilateral leg swelling. No headache or blurry vision or extremity weakness or trouble swallowing. No tinnitus or hearing difficulty.   Past Medical History- HLD, HTN, GERD, diverticulosis  Medications- reviewed and updated Current Outpatient Prescriptions  Medication Sig Dispense Refill  . CELEBREX 200 MG capsule Take 200 mg by mouth daily as needed.       . Cholecalciferol (VITAMIN D-3 PO) Take 1,200 mg by mouth daily.      . Coenzyme Q10 (COQ-10) 150 MG CAPS 150 mg daily.       Marland Kitchen esomeprazole (NEXIUM) 40 MG capsule Take 1 capsule (40 mg total) by mouth daily.  90 capsule  1  . meclizine (ANTIVERT) 25 MG tablet Take 1 tablet (25 mg total) by mouth 3 (three) times daily as needed.  30 tablet  0  . pantoprazole (PROTONIX) 40 MG tablet TAKE ONE TABLET BY MOUTH ONCE DAILY       No  current facility-administered medications for this visit.    Objective: BP 140/86  Pulse 72  Temp(Src) 98.6 F (37 C) (Oral)  Ht 5' 7.75" (1.721 m)  Wt 206 lb 5 oz (93.583 kg)  BMI 31.60 kg/m2  SpO2 95% Gen: NAD, resting comfortably CV: RRR no murmurs rubs or gallops Lungs: CTAB no crackles, wheeze, rhonchi Neuro: complains of vertigo with head turn, no lightheadedness with standing, normal non ataxic gait.   No significant drop in BP or raise in HR with orthostatics  Assessment/Plan:  HYPERTENSION Goal systolic blood pressure 914. At goal off of HCTZ. Continue off of medication especially given orthostatic symptoms.   Dizziness Orthostatic symptoms have resolved off of HCTZ. This was likely contributing to symptoms.  Patient also likely with BPPV given symptoms only occuring with head turning. Discussed TIA or CVA but discussed not likely given focus of symptoms with head turning and symptoms not persistent otherwise. Still encouraged daily aspirin given age.  Referred to vestibular rehab and can continue meclizine as needed.   Discussed reasons for follow up specifically with signs of TIA/CVA.   Orders Placed This Encounter  Procedures  . PT vestibular rehab    Standing Status: Future     Number of Occurrences:      Standing Expiration Date: 03/08/2015

## 2014-03-07 NOTE — Assessment & Plan Note (Signed)
Goal systolic blood pressure 888. At goal off of HCTZ. Continue off of medication especially given orthostatic symptoms.

## 2014-03-07 NOTE — Progress Notes (Signed)
Pre visit review using our clinic review tool, if applicable. No additional management support is needed unless otherwise documented below in the visit note. 

## 2014-03-07 NOTE — Patient Instructions (Addendum)
We are going to set you up for vestibular rehab. The physical therapist will call you about setting up appointment.  I do think you had orthostatic hypotension (low blood pressure with standing) so please continue off of the HCTZ for now.  Use Meclizine as needed.  Let's check back in 1-2 weeks after you get the rehab. We will check on your symptoms as well as   Benign Positional Vertigo Vertigo means you feel like you or your surroundings are moving when they are not. Benign positional vertigo is the most common form of vertigo. Benign means that the cause of your condition is not serious. Benign positional vertigo is more common in older adults. CAUSES  Benign positional vertigo is the result of an upset in the labyrinth system. This is an area in the middle ear that helps control your balance. This may be caused by a viral infection, head injury, or repetitive motion. However, often no specific cause is found. SYMPTOMS  Symptoms of benign positional vertigo occur when you move your head or eyes in different directions. Some of the symptoms may include:  Loss of balance and falls.  Vomiting.  Blurred vision.  Dizziness.  Nausea.  Involuntary eye movements (nystagmus). DIAGNOSIS  Benign positional vertigo is usually diagnosed by physical exam.(CT). TREATMENT  Your caregiver may recommend movements or procedures to correct the benign positional vertigo. Medicines such as meclizine, benzodiazepines, and medicines for nausea may be used to treat your symptoms. In rare cases, if your symptoms are caused by certain conditions that affect the inner ear, you may need surgery. This would be a concern if no improvement after rehab. We could also consider sending you to ENT if persistent.  HOME CARE INSTRUCTIONS   Follow your caregiver's instructions.  Move slowly. Do not make sudden body or head movements.  Avoid driving.  Avoid operating heavy machinery.  Avoid performing any tasks that  would be dangerous to you or others during a vertigo episode.  Drink enough fluids to keep your urine clear or pale yellow. SEEK IMMEDIATE MEDICAL CARE IF:   You develop problems with walking, weakness, numbness, or using your arms, hands, or legs.  You have difficulty speaking.  You develop severe headaches.  Your nausea or vomiting continues or gets worse.  You develop visual changes.  Your family or friends notice any behavioral changes.  Your condition gets worse or fails to improve.  If your symptoms start happening without head movements  You have a fever.  You develop a stiff neck or sensitivity to light. MAKE SURE YOU:   Understand these instructions.  Will watch your condition.  Will get help right away if you are not doing well or get worse. Document Released: 05/02/2006 Document Revised: 10/17/2011 Document Reviewed: 04/14/2011 [facility] Patient Information 2015 [place], [place]. This information is not intended to replace advice given to you by your health care provider. Make sure you discuss any questions you have with your health care provider.

## 2014-04-07 DIAGNOSIS — H8110 Benign paroxysmal vertigo, unspecified ear: Secondary | ICD-10-CM | POA: Insufficient documentation

## 2014-05-04 ENCOUNTER — Encounter: Payer: Self-pay | Admitting: Internal Medicine

## 2014-07-29 ENCOUNTER — Ambulatory Visit (INDEPENDENT_AMBULATORY_CARE_PROVIDER_SITE_OTHER): Payer: BC Managed Care – PPO | Admitting: Internal Medicine

## 2014-07-29 ENCOUNTER — Encounter: Payer: Self-pay | Admitting: Internal Medicine

## 2014-07-29 VITALS — BP 120/84 | HR 69 | Ht 67.0 in | Wt 211.0 lb

## 2014-07-29 DIAGNOSIS — K2100 Gastro-esophageal reflux disease with esophagitis, without bleeding: Secondary | ICD-10-CM

## 2014-07-29 DIAGNOSIS — K219 Gastro-esophageal reflux disease without esophagitis: Secondary | ICD-10-CM

## 2014-07-29 NOTE — Patient Instructions (Signed)
Call our office back after the first of the year to see which PPI will be appropriate

## 2014-07-29 NOTE — Progress Notes (Signed)
HISTORY OF PRESENT ILLNESS:  Cynthia Padilla is a 63 y.o. female with multiple medical problems as listed below. Followed in this office for chronic GERD and screening colonoscopy. 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 Excell Seltzer. She was last seen in the office December 2014 with subxiphoid fullness. Her PPI was changed from Nexium to Protonix. Abdominal ultrasound was unrevealing. Patient presents today regarding chronic reflux and the need for PPI. States that she has significant symptoms off PPI. Does better with Nexium than Protonix. No dysphagia. Last upper endoscopy performed September 2006 was normal. Her last colonoscopy performed June 2013 revealed diverticulosis and an ascending colon polyp which was sessile serrated polyp. Follow-up in 5 years recommended. GI review of systems is otherwise negative  REVIEW OF SYSTEMS:  All non-GI ROS negative except for muscle cramps right leg  Past Medical History  Diagnosis Date  . History of colonic polyps   . Diverticulitis   . Hyperlipidemia   . Hypertension   . GERD (gastroesophageal reflux disease)   . PONV (postoperative nausea and vomiting)     states she gets deathly sick  . Asthma     had it once when she had episode of bronchitis about 10 yrs ago  . Arthritis     thinks she has some in her spine, bursitis in hips  . Anxiety   . Hepatitis     thinks she had Hepatits as a child  . DIVERTICULOSIS, COLON 11/04/2004    Qualifier: Diagnosis of  By: Hardin Negus CMA (AAMA), [place]    . Internal hemorrhoids     Past Surgical History  Procedure Laterality Date  . Elbow fracture surgery Left   . Tubal ligation    . Laminectomy  10/11/11    with fusion @ L4-5  . Hernia repair      Social History [name]  reports that she has never smoked. She has never used smokeless tobacco. She reports that she drinks about 1.8 oz of alcohol per week. She reports that she does not use  illicit drugs.  family history includes Diabetes in her brother, father, and sister; Heart disease in her father; Leukemia in her father; Lung cancer in her mother; Ovarian cancer in her mother; Rectal cancer in her sister. There is no history of Colon cancer, Throat cancer, Kidney disease, Anesthesia problems, or Liver disease.  No Known Allergies     PHYSICAL EXAMINATION: Vital signs: BP 120/84 mmHg  Pulse 69  Ht 5\' 7"  (1.702 m)  Wt 211 lb (95.709 kg)  BMI 33.04 kg/m2  SpO2 98% General: Well-developed, well-nourished, no acute distress HEENT: Sclerae are anicteric, conjunctiva pink. Oral mucosa intact Lungs: Clear Heart: Regular Abdomen: soft, nontender, nondistended, no obvious ascites, no peritoneal signs, normal bowel sounds. No organomegaly. Extremities: No edema Psychiatric: alert and oriented x3. Cooperative     ASSESSMENT:  #1. Chronic GERD. No alarm features. Previous negative EGD. Does better on Nexium #2. Last colonoscopy June 2013 with sessile serrated polyp.  #3. History of AIN status post local excision   PLAN:  #1. Nexium 40 mg daily #2. Reflux precautions #3. Surveillance colonoscopy around June 2018

## 2015-01-30 ENCOUNTER — Ambulatory Visit (INDEPENDENT_AMBULATORY_CARE_PROVIDER_SITE_OTHER): Payer: Medicare Other | Admitting: Internal Medicine

## 2015-01-30 ENCOUNTER — Encounter: Payer: Self-pay | Admitting: Internal Medicine

## 2015-01-30 VITALS — BP 114/78 | HR 80 | Ht 67.75 in | Wt 209.4 lb

## 2015-01-30 DIAGNOSIS — R1013 Epigastric pain: Secondary | ICD-10-CM | POA: Diagnosis not present

## 2015-01-30 DIAGNOSIS — R490 Dysphonia: Secondary | ICD-10-CM

## 2015-01-30 DIAGNOSIS — K219 Gastro-esophageal reflux disease without esophagitis: Secondary | ICD-10-CM

## 2015-01-30 DIAGNOSIS — R112 Nausea with vomiting, unspecified: Secondary | ICD-10-CM | POA: Diagnosis not present

## 2015-01-30 MED ORDER — ESOMEPRAZOLE MAGNESIUM 40 MG PO CPDR: 40.0000 mg | DELAYED_RELEASE_CAPSULE | Freq: Two times a day (BID) | ORAL | Status: DC

## 2015-01-30 NOTE — Progress Notes (Signed)
HISTORY OF PRESENT ILLNESS:  Cynthia Padilla is a 64 y.o. female with multiple medical problems as listed below. She is followed in this office for chronic GERD and screening colonoscopy. 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 Excell Seltzer. Last evaluated December 2015 for ongoing management of GERD. Last colonoscopy June 2013 with sessile serrated polyp. She presents today with chief complaint of epigastric burning, epigastric discomfort, and hoarseness. The epigastric burning began several months ago. The tenderness 3-4 weeks ago. Hoarseness often non-for several months. The patient mentions epigastric discomfort 3-4 times per week generally lasting 1-2 hours. More prominent in the afternoon and at night. Seems to be exacerbated by coffee and relieved somewhat by yogurt. Antiacids have a variable effect. She takes Nexium 40 mg daily. She denies dysphagia. No weight loss. She does report some nausea with vomiting. No hematemesis. She uses Celebrex several times per week. Rarely ibuprofen. Her gallbladder is in.  REVIEW OF SYSTEMS:  All non-GI ROS negative except for muscle cramps, hoarseness  Past Medical History  Diagnosis Date  . History of colonic polyps   . Diverticulitis   . Hyperlipidemia   . Hypertension   . GERD (gastroesophageal reflux disease)   . PONV (postoperative nausea and vomiting)     states she gets deathly sick  . Asthma     had it once when she had episode of bronchitis about 10 yrs ago  . Arthritis     thinks she has some in her spine, bursitis in hips  . Anxiety   . Hepatitis     thinks she had Hepatits as a child  . DIVERTICULOSIS, COLON 11/04/2004    Qualifier: Diagnosis of  By: Hardin Negus CMA (AAMA), [place]    . Internal hemorrhoids   . Vertigo     Past Surgical History  Procedure Laterality Date  . Elbow fracture surgery Left   . Tubal ligation    . Laminectomy  10/11/11    with fusion @ L4-5  . Hernia repair     . Retinal tear repair cryotherapy Right 2015    Social History Cynthia Padilla  reports that she has never smoked. She has never used smokeless tobacco. She reports that she drinks about 1.8 oz of alcohol per week. She reports that she does not use illicit drugs.  family history includes Diabetes in her brother, father, and sister; Heart disease in her father; Leukemia in her father; Lung cancer in her mother; Ovarian cancer in her mother; Rectal cancer in her sister. There is no history of Colon cancer, Throat cancer, Kidney disease, Anesthesia problems, or Liver disease.  No Known Allergies     PHYSICAL EXAMINATION: Vital signs: BP 114/78 mmHg  Pulse 80  Ht 5' 7.75" (1.721 m)  Wt 209 lb 6 oz (94.972 kg)  BMI 32.07 kg/m2 General: Well-developed, well-nourished, no acute distress HEENT: Sclerae are anicteric, conjunctiva pink. Oral mucosa intact Lungs: Clear Heart: Regular Abdomen: soft, tenderness in the epigastric region, nondistended, no obvious ascites, no peritoneal signs, normal bowel sounds. No organomegaly. Extremities: No edema Psychiatric: alert and oriented x3. Cooperative   ASSESSMENT:  #1. Epigastric burning/tenderness. Intermittent nausea with vomiting. Possible etiologies include refractory GERD, ulcer disease, and biliary disease #2. GERD. May explain hoarseness. Last EGD 10 years ago for chest pain, reflux symptoms, and throat clearing. Exam was normal #3. History of sessile serrated polyp. 2013. #4. Sessile serrated polyp 2013   PLAN:  #1. Reflux precautions with attention  to weight loss. #2. Increase Nexium to 40 mg twice daily. Prescribed today #3. Abdominal ultrasound to rule out gallstones and evaluate surrounding organs. Ordered today #4. Upper endoscopy to rule out ulcer. Scheduled today. The nature of the procedure, as well as the risks, benefits, and alternatives were carefully and thoroughly reviewed with the patient. Ample time for discussion and  questions allowed. The patient understood, was satisfied, and agreed to proceed. #5. Surveillance colonoscopy around June 2018

## 2015-01-30 NOTE — Patient Instructions (Addendum)
You have been scheduled for an abdominal ultrasound at [facility] Radiology (1st floor of hospital) on 6/29 at 8:30am. Please arrive 15 minutes prior to your appointment for registration. Make certain not to have anything to eat or drink 6 hours prior to your appointment. Should you need to reschedule your appointment, please contact radiology at [phone]. This test typically takes about 30 minutes to perform.  You have been scheduled for an endoscopy. Please follow written instructions given to you at your visit today. If you use inhalers (even only as needed), please bring them with you on the day of your procedure. Your physician has requested that you go to www.startemmi.com and enter the access code given to you at your visit today. This web site gives a general overview about your procedure. However, you should still follow specific instructions given to you by our office regarding your preparation for the procedure.  Nexium increased to twice a day Prescription has been sent to your pharmacy

## 2015-02-02 ENCOUNTER — Other Ambulatory Visit: Payer: Self-pay

## 2015-02-04 ENCOUNTER — Ambulatory Visit (HOSPITAL_COMMUNITY)
Admission: RE | Admit: 2015-02-04 | Discharge: 2015-02-04 | Disposition: A | Discharge status: Home / Self Care | Payer: Medicare Other | Source: Ambulatory Visit | Attending: Internal Medicine | Admitting: Internal Medicine

## 2015-02-04 DIAGNOSIS — R1013 Epigastric pain: Secondary | ICD-10-CM | POA: Diagnosis present

## 2015-02-04 DIAGNOSIS — K219 Gastro-esophageal reflux disease without esophagitis: Secondary | ICD-10-CM

## 2015-02-05 ENCOUNTER — Ambulatory Visit (AMBULATORY_SURGERY_CENTER): Payer: Medicare Other | Admitting: Internal Medicine

## 2015-02-05 ENCOUNTER — Encounter: Payer: Self-pay | Admitting: Internal Medicine

## 2015-02-05 VITALS — BP 124/76 | HR 63 | Temp 98.1°F | Resp 22 | Ht 67.75 in | Wt 209.0 lb

## 2015-02-05 DIAGNOSIS — K219 Gastro-esophageal reflux disease without esophagitis: Secondary | ICD-10-CM

## 2015-02-05 DIAGNOSIS — R1013 Epigastric pain: Secondary | ICD-10-CM

## 2015-02-05 MED ORDER — SODIUM CHLORIDE 0.9 % IV SOLN: 500.0000 mL | INTRAVENOUS | Status: DC

## 2015-02-05 NOTE — Progress Notes (Signed)
Report to PACU, RN, vss, BBS= Clear.  

## 2015-02-05 NOTE — Op Note (Signed)
[place]  [address] [place], [address]   ENDOSCOPY PROCEDURE REPORT  PATIENT: Cynthia Padilla, Cynthia Padilla  MR#: [id number] BIRTHDATE: February 06, 1951 , 29  yrs. old GENDER: female ENDOSCOPIST: Eustace Quail, MD REFERRED BY:  .  Self / Office PROCEDURE DATE:  02/05/2015 PROCEDURE:  EGD, diagnostic ASA CLASS:     Class II INDICATIONS:  epigastric pain and history of esophageal reflux. Hoarseness MEDICATIONS: Monitored anesthesia care and Propofol 200 mg IV TOPICAL ANESTHETIC: none  DESCRIPTION OF PROCEDURE: After the risks benefits and alternatives of the procedure were thoroughly explained, informed consent was obtained.  The LB [id number] endoscope was introduced through the mouth and advanced to the second portion of the duodenum , Without limitations.  The instrument was slowly withdrawn as the mucosa was fully examined.   EXAM: The esophagus and gastroesophageal junction were completely normal in appearance.  The stomach was entered and closely examined.The antrum, angularis, and lesser curvature were well visualized, including a retroflexed view of the cardia and fundus. The stomach wall was normally distensable.  The scope passed easily through the pylorus into the duodenum.  Retroflexed views revealed no abnormalities.     The scope was then withdrawn from the patient and the procedure completed.  COMPLICATIONS: There were no immediate complications.  ENDOSCOPIC IMPRESSION: 1. Normal EGD 2. GERD  RECOMMENDATIONS: 1.  Anti-reflux regimen to be followed 2.  Continue Nexium twice daily 3. Office follow up with Dr. Henrene Pastor in 2 months  REPEAT EXAM:  eSigned:  Eustace Quail, MD 02/05/2015 1:50 PM    CC:The Patient  ; Velna Hatchet

## 2015-02-05 NOTE — Patient Instructions (Signed)
YOU HAD AN ENDOSCOPIC PROCEDURE TODAY AT [facility]:   Refer to the procedure report that was given to you for any specific questions about what was found during the examination.  If the procedure report does not answer your questions, please call your gastroenterologist to clarify.  If you requested that your care partner not be given the details of your procedure findings, then the procedure report has been included in a sealed envelope for you to review at your convenience later.  YOU SHOULD EXPECT: Some feelings of bloating in the abdomen. Passage of more gas than usual.  Walking can help get rid of the air that was put into your GI tract during the procedure and reduce the bloating. If you had a lower endoscopy (such as a colonoscopy or flexible sigmoidoscopy) you may notice spotting of blood in your stool or on the toilet paper. If you underwent a bowel prep for your procedure, you may not have a normal bowel movement for a few days.  Please Note:  You might notice some irritation and congestion in your nose or some drainage.  This is from the oxygen used during your procedure.  There is no need for concern and it should clear up in a day or so.  SYMPTOMS TO REPORT IMMEDIATELY:   Following upper endoscopy (EGD)  Vomiting of blood or coffee ground material  New chest pain or pain under the shoulder blades  Painful or persistently difficult swallowing  New shortness of breath  Fever of 100F or higher  Black, tarry-looking stools  For urgent or emergent issues, a gastroenterologist can be reached at any hour by calling [phone].   DIET: Your first meal following the procedure should be a small meal and then it is ok to progress to your normal diet. Heavy or fried foods are harder to digest and may make you feel nauseous or bloated.  Likewise, meals heavy in dairy and vegetables can increase bloating.  Drink plenty of fluids but you should avoid alcoholic beverages for  24 hours.  ACTIVITY:  You should plan to take it easy for the rest of today and you should NOT DRIVE or use heavy machinery until tomorrow (because of the sedation medicines used during the test).    FOLLOW UP: Our staff will call the number listed on your records the next business day following your procedure to check on you and address any questions or concerns that you may have regarding the information given to you following your procedure. If we do not reach you, we will leave a message.  However, if you are feeling well and you are not experiencing any problems, there is no need to return our call.  We will assume that you have returned to your regular daily activities without incident.  If any biopsies were taken you will be contacted by phone or by letter within the next 1-3 weeks.  Please call us at [phone] if you have not heard about the biopsies in 3 weeks.    SIGNATURES/CONFIDENTIALITY: You and/or your care partner have signed paperwork which will be entered into your electronic medical record.  These signatures attest to the fact that that the information above on your After Visit Summary has been reviewed and is understood.  Full responsibility of the confidentiality of this discharge information lies with you and/or your care-partner.  Anti- reflux as needed Continue Nexium twice a day Office visit with Dr. Henrene Pastor in 2 months

## 2015-02-06 ENCOUNTER — Telehealth: Payer: Self-pay

## 2015-02-06 NOTE — Telephone Encounter (Signed)
  Follow up Call-  Call back number 02/05/2015  Post procedure Call Back phone  # [phone]  Permission to leave phone message Yes     Patient questions:  Do you have a fever, pain , or abdominal swelling? No. Pain Score  0 *  Have you tolerated food without any problems? Yes.    Have you been able to return to your normal activities? Yes.    Do you have any questions about your discharge instructions: Diet   No. Medications  No. Follow up visit  No.  Do you have questions or concerns about your Care? No.  Actions: * If pain score is 4 or above: No action needed, pain <4.  No problems per the pt. maw

## 2015-03-03 ENCOUNTER — Ambulatory Visit: Payer: Self-pay | Admitting: Internal Medicine

## 2015-03-26 ENCOUNTER — Ambulatory Visit: Payer: Medicare Other | Admitting: Internal Medicine

## 2015-04-29 ENCOUNTER — Encounter: Payer: Self-pay | Admitting: Internal Medicine

## 2015-05-06 LAB — IFOBT (OCCULT BLOOD): IMMUNOLOGICAL FECAL OCCULT BLOOD TEST: NEGATIVE

## 2015-07-07 DIAGNOSIS — E669 Obesity, unspecified: Secondary | ICD-10-CM | POA: Insufficient documentation

## 2015-07-27 ENCOUNTER — Other Ambulatory Visit: Payer: Self-pay | Admitting: Obstetrics and Gynecology

## 2015-07-27 DIAGNOSIS — R928 Other abnormal and inconclusive findings on diagnostic imaging of breast: Secondary | ICD-10-CM

## 2015-07-31 ENCOUNTER — Ambulatory Visit
Admission: RE | Admit: 2015-07-31 | Discharge: 2015-07-31 | Disposition: A | Discharge status: Home / Self Care | Payer: Medicare Other | Source: Ambulatory Visit | Attending: Obstetrics and Gynecology | Admitting: Obstetrics and Gynecology

## 2015-07-31 DIAGNOSIS — R928 Other abnormal and inconclusive findings on diagnostic imaging of breast: Secondary | ICD-10-CM

## 2015-08-31 ENCOUNTER — Encounter: Payer: Self-pay | Admitting: Internal Medicine

## 2015-08-31 ENCOUNTER — Ambulatory Visit (INDEPENDENT_AMBULATORY_CARE_PROVIDER_SITE_OTHER): Payer: Medicare Other | Admitting: Internal Medicine

## 2015-08-31 VITALS — BP 134/88 | HR 71 | Ht 67.5 in | Wt 214.0 lb

## 2015-08-31 DIAGNOSIS — K219 Gastro-esophageal reflux disease without esophagitis: Secondary | ICD-10-CM

## 2015-08-31 DIAGNOSIS — R079 Chest pain, unspecified: Secondary | ICD-10-CM | POA: Diagnosis not present

## 2015-08-31 NOTE — Progress Notes (Signed)
Cardiology Office Note   Date:  08/31/2015   ID:  Cynthia Padilla, DOB 1950/09/03, MRN [id number]  PCP:  Velna Hatchet, MD  Cardiologist:   Dorris Carnes, MD    Pt referred by Dr Ardeth Perfect for CP   History of Present Illness: Cynthia Padilla is a 65 y.o. female with a history of Chest pressure  Episodes occur at  night   Pt will feel pressure But also has GERD  Difficult to tell Has been on Nexium for a long time  Trying to get off  Was on 2 per day  Now 1  Per day Episodes of Chest pressure begain when she cut back on Nexium  When went back to 2 per day episodes eased .   Eating different  Trying not to eat late Has BAD fhx of CAD  Had normal EGD in June 2016 when she was back on Nexium 2 per day    During day walks  Does pilates 2x per wk  Goes to [employer]  No problems with pressure while physically active     Current Outpatient Prescriptions  Medication Sig Dispense Refill  . CELEBREX 200 MG capsule Take 200 mg by mouth daily as needed.     . Cholecalciferol (VITAMIN D-3 PO) Take 1,200 mg by mouth daily.    . Coenzyme Q10 (COQ-10) 150 MG CAPS 150 mg daily.     Marland Kitchen esomeprazole (NEXIUM) 40 MG capsule Take 1 capsule (40 mg total) by mouth daily. 90 capsule 1  . hydrochlorothiazide (MICROZIDE) 12.5 MG capsule Take 12.5 mg by mouth 2 (two) times daily.     . meclizine (ANTIVERT) 25 MG tablet Take 1 tablet (25 mg total) by mouth 3 (three) times daily as needed. 30 tablet 0   No current facility-administered medications for this visit.    Allergies:   Review of patient's allergies indicates no known allergies.   Past Medical History  Diagnosis Date  . History of colonic polyps   . Diverticulitis   . Hyperlipidemia   . Hypertension   . GERD (gastroesophageal reflux disease)   . PONV (postoperative nausea and vomiting)     states she gets deathly sick  . Asthma     had it once when she had episode of bronchitis about 10 yrs ago  . Arthritis     thinks she has some in  her spine, bursitis in hips  . Anxiety   . Hepatitis     thinks she had Hepatits as a child  . DIVERTICULOSIS, COLON 11/04/2004    Qualifier: Diagnosis of  By: Hardin Negus CMA (AAMA), Colletta [place]    . Internal hemorrhoids   . Vertigo     Past Surgical History  Procedure Laterality Date  . Elbow fracture surgery Left   . Tubal ligation    . Laminectomy  10/11/11    with fusion @ L4-5  . Hernia repair    . Retinal tear repair cryotherapy Right 2015     Social History:  The patient  reports that she has never smoked. She has never used smokeless tobacco. She reports that she drinks about 1.8 oz of alcohol per week. She reports that she does not use illicit drugs.   Family History:  The patient's family history includes Diabetes in her brother, father, and sister; Heart disease in her father; Leukemia in her father; Lung cancer in her mother; Ovarian cancer in her mother; Rectal cancer in her sister. There is no history of  Colon cancer, Throat cancer, Kidney disease, Anesthesia problems, or Liver disease.    ROS:  Please see the history of present illness. All other systems are reviewed and  Negative to the above problem except as noted.    PHYSICAL EXAM: VS:  BP 134/88 mmHg  Pulse 71  Ht 5' 7.5" (1.715 m)  Wt 97.07 kg (214 lb)  BMI 33.00 kg/m2  SpO2 97%  GEN: Well nourished, well developed, in no acute distress HEENT: normal Neck: no JVD, carotid bruits, or masses Cardiac: RRR; no murmurs, rubs, or gallops,no edema  Respiratory:  clear to auscultation bilaterally, normal work of breathing GI: soft, nontender, nondistended, + BS  No hepatomegaly  MS: no deformity Moving all extremities   Skin: warm and dry, no rash Neuro:  Strength and sensation are intact Psych: euthymic mood, full affect   EKG:  EKG is not ordered today.  Faxed copies show SR     Lipid Panel    Component Value Date/Time   CHOL 196 01/29/2014 0813   TRIG 101.0 01/29/2014 0813   HDL 59.20 01/29/2014 0813     CHOLHDL 3 01/29/2014 0813   VLDL 20.2 01/29/2014 0813   LDLCALC 117* 01/29/2014 0813   LDLDIRECT 125.6 03/28/2012 0922      Wt Readings from Last 3 Encounters:  08/31/15 97.07 kg (214 lb)  02/05/15 94.802 kg (209 lb)  01/30/15 94.972 kg (209 lb 6 oz)      ASSESSMENT AND PLAN:  1  CP  Atypical I do not think cardiac in origin  Not associated with activty  Most likely GI    2.  HL  With FHx of CAD I told her to consider calcium score CT or carotid/abd USN    Neither would be covered by insurance  She will reflect on this  Results would impact how she controls her cholesterol    3  GI  Encouraged her to make a f/u appt with GI to review meds for reflux     No definite f/u scheduled    Signed, Dorris Carnes, MD  08/31/2015 2:54 PM    [facility] [place], [place], [place]  [address] Phone: [phone]; Fax: [phone]

## 2015-08-31 NOTE — Patient Instructions (Addendum)
Your physician recommends that you continue on your current medications as directed. Please refer to the Current Medication list given to you today.   Your physician recommends that you schedule a follow-up appointment as needed  

## 2015-12-21 ENCOUNTER — Other Ambulatory Visit: Payer: Self-pay | Admitting: Obstetrics and Gynecology

## 2015-12-21 DIAGNOSIS — N6310 Unspecified lump in the right breast, unspecified quadrant: Secondary | ICD-10-CM

## 2016-01-07 DIAGNOSIS — M549 Dorsalgia, unspecified: Secondary | ICD-10-CM | POA: Insufficient documentation

## 2016-01-27 ENCOUNTER — Ambulatory Visit
Admission: RE | Admit: 2016-01-27 | Discharge: 2016-01-27 | Disposition: A | Discharge status: Home / Self Care | Payer: Medicare Other | Source: Ambulatory Visit | Attending: Obstetrics and Gynecology | Admitting: Obstetrics and Gynecology

## 2016-01-27 DIAGNOSIS — N6310 Unspecified lump in the right breast, unspecified quadrant: Secondary | ICD-10-CM

## 2016-04-27 DIAGNOSIS — Z8249 Family history of ischemic heart disease and other diseases of the circulatory system: Secondary | ICD-10-CM | POA: Insufficient documentation

## 2016-05-09 ENCOUNTER — Ambulatory Visit: Payer: Medicare Other | Admitting: Internal Medicine

## 2016-06-21 ENCOUNTER — Ambulatory Visit: Payer: Medicare Other | Admitting: Internal Medicine

## 2016-06-24 ENCOUNTER — Other Ambulatory Visit: Payer: Self-pay | Admitting: Obstetrics and Gynecology

## 2016-06-24 DIAGNOSIS — N63 Unspecified lump in unspecified breast: Secondary | ICD-10-CM

## 2016-07-18 ENCOUNTER — Other Ambulatory Visit: Payer: Self-pay | Admitting: Internal Medicine

## 2016-07-25 ENCOUNTER — Ambulatory Visit
Admission: RE | Admit: 2016-07-25 | Discharge: 2016-07-25 | Disposition: A | Discharge status: Home / Self Care | Payer: Medicare Other | Source: Ambulatory Visit | Attending: Obstetrics and Gynecology | Admitting: Obstetrics and Gynecology

## 2016-07-25 DIAGNOSIS — N63 Unspecified lump in unspecified breast: Secondary | ICD-10-CM

## 2016-07-27 ENCOUNTER — Other Ambulatory Visit: Payer: Self-pay | Admitting: Obstetrics and Gynecology

## 2016-07-28 LAB — CYTOLOGY - PAP

## 2016-08-10 ENCOUNTER — Ambulatory Visit (INDEPENDENT_AMBULATORY_CARE_PROVIDER_SITE_OTHER): Payer: Medicare Other | Admitting: Internal Medicine

## 2016-08-10 ENCOUNTER — Encounter: Payer: Self-pay | Admitting: Internal Medicine

## 2016-08-10 VITALS — BP 134/84 | HR 72 | Ht 67.5 in | Wt 215.1 lb

## 2016-08-10 DIAGNOSIS — K5730 Diverticulosis of large intestine without perforation or abscess without bleeding: Secondary | ICD-10-CM | POA: Diagnosis not present

## 2016-08-10 DIAGNOSIS — Z86010 Personal history of colonic polyps: Secondary | ICD-10-CM | POA: Diagnosis not present

## 2016-08-10 DIAGNOSIS — G8929 Other chronic pain: Secondary | ICD-10-CM

## 2016-08-10 DIAGNOSIS — K589 Irritable bowel syndrome without diarrhea: Secondary | ICD-10-CM | POA: Diagnosis not present

## 2016-08-10 DIAGNOSIS — K219 Gastro-esophageal reflux disease without esophagitis: Secondary | ICD-10-CM

## 2016-08-10 DIAGNOSIS — R1032 Left lower quadrant pain: Secondary | ICD-10-CM

## 2016-08-10 MED ORDER — NA SULFATE-K SULFATE-MG SULF 17.5-3.13-1.6 GM/177ML PO SOLN: 1.0000 | Freq: Once | ORAL | 0 refills | Status: AC

## 2016-08-10 MED ORDER — HYOSCYAMINE SULFATE 0.125 MG SL SUBL: SUBLINGUAL_TABLET | SUBLINGUAL | 1 refills | Status: DC

## 2016-08-10 NOTE — Patient Instructions (Addendum)
We have sent the following medications to your pharmacy for you to pick up at your convenience:  [place] have been scheduled for a colonoscopy. Please follow written instructions given to you at your visit today.  Please pick up your prep supplies at the pharmacy within the next 1-3 days. If you use inhalers (even only as needed), please bring them with you on the day of your procedure. Your physician has requested that you go to www.startemmi.com and enter the access code given to you at your visit today. This web site gives a general overview about your procedure. However, you should still follow specific instructions given to you by our office regarding your preparation for the procedure.

## 2016-08-10 NOTE — Progress Notes (Signed)
HISTORY OF PRESENT ILLNESS:  Cynthia Padilla is a 66 y.o. 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 She presents today regarding significant reflux symptoms and the need for surveillance colonoscopy. Last colonoscopy June 2013 at which time she was found to have a sessile serrated polyp and diverticulosis. Follow-up in 5 years recommended. Last evaluation with Dr. Excell Seltzer August 2017 was negative. Previous upper endoscopy June 2016 was normal. The patient had been on Nexium twice daily. Because of reports in the late literature siting a myriad of potential problems with long-term PPI use, the patient stopped her Nexium. Currently using this sporadically. She does notice significant reflux symptoms with dietary indiscretion. No dysphagia. GI review of systems is otherwise remarkable for intermittent left lower quadrant pain which she describes as cramping. Previously treated with antispasmodics which helped. She requests the same.  REVIEW OF SYSTEMS:  All non-GI ROS negative except for anxiety, arthritis, asthma  Past Medical History:  Diagnosis Date  . Anxiety   . Arthritis    thinks she has some in her spine, bursitis in hips  . Asthma    had it once when she had episode of bronchitis about 10 yrs ago  . Diverticulitis   . DIVERTICULOSIS, COLON 11/04/2004   Qualifier: Diagnosis of  By: Hardin Negus CMA (AAMA), Colletta [place]    . GERD (gastroesophageal reflux disease)   . Hepatitis    thinks she had Hepatits as a child  . History of colonic polyps   . Hyperlipidemia   . Hypertension   . Internal hemorrhoids   . PONV (postoperative nausea and vomiting)    states she gets deathly sick  . Vertigo     Past Surgical History:  Procedure Laterality Date  . ELBOW FRACTURE SURGERY Left   . HERNIA REPAIR    . LAMINECTOMY  10/11/11   with fusion @ L4-5  . RETINAL TEAR REPAIR CRYOTHERAPY Right 2015  . TUBAL LIGATION      Social  History Cynthia Padilla  reports that she has never smoked. She has never used smokeless tobacco. She reports that she drinks about 1.8 oz of alcohol per week . She reports that she does not use drugs.  family history includes Diabetes in her brother, father, and sister; Heart disease in her father; Leukemia in her father; Lung cancer in her mother; Ovarian cancer in her mother; Rectal cancer in her sister.  No Known Allergies     PHYSICAL EXAMINATION: Vital signs: BP 134/84   Pulse 72   Ht 5' 7.5" (1.715 m)   Wt 215 lb 2 oz (97.6 kg)   BMI 33.20 kg/m   Constitutional: generally well-appearing, no acute distress Psychiatric: alert and oriented x3, cooperative Eyes: extraocular movements intact, anicteric, conjunctiva pink Mouth: oral pharynx moist, no lesions Neck: supple no lymphadenopathy Cardiovascular: heart regular rate and rhythm, no murmur Lungs: clear to auscultation bilaterally Abdomen: soft, nontender, nondistended, no obvious ascites, no peritoneal signs, normal bowel sounds, no organomegaly Rectal:Deferred until colonoscopy Extremities: no clubbing cyanosis or lower extremity edema bilaterally Skin: no lesions on visible extremities Neuro: No focal deficits. Cranial nerves intact  ASSESSMENT:  #1. GERD. Deteriorated off PPI. Upper endoscopy June 2013 normal #2. History of sessile serrated polyp. Due for surveillance #3. Diverticulosis. Intermittent problems with left lower quadrant cramping discomfort. Antispasmodic was helpful previously #4. History of AIN. Followed by Dr. Excell Seltzer   PLAN:  #1. Reflux precautions with attention to weight loss #2. Discussed alternative  to PPI including H2 receptor antagonist #3. Discussed current knowledge regarding purported long-term issues with PPI therapy. Also provided her with JAMA letter from September 2017 addressing this issue #4. Prescribed Levsin sublingual 0.125 mg when necessary cramping pain #5. Surveillance  colonoscopy.The nature of the procedure, as well as the risks, benefits, and alternatives were carefully and thoroughly reviewed with the patient. Ample time for discussion and questions allowed. The patient understood, was satisfied, and agreed to proceed.  25 minutes was spent face-to-face with the patient. Greater than 50% a time was used for counseling and care plan regarding GERD, PPI issues, treatment of GERD, and diverticular spasm

## 2016-09-28 ENCOUNTER — Encounter: Payer: Self-pay | Admitting: Internal Medicine

## 2016-10-12 ENCOUNTER — Ambulatory Visit (AMBULATORY_SURGERY_CENTER): Payer: Medicare Other | Admitting: Internal Medicine

## 2016-10-12 ENCOUNTER — Encounter: Payer: Self-pay | Admitting: Internal Medicine

## 2016-10-12 VITALS — BP 117/70 | HR 51 | Temp 96.8°F | Resp 15 | Ht 67.5 in | Wt 215.0 lb

## 2016-10-12 DIAGNOSIS — D125 Benign neoplasm of sigmoid colon: Secondary | ICD-10-CM

## 2016-10-12 DIAGNOSIS — K635 Polyp of colon: Secondary | ICD-10-CM | POA: Diagnosis not present

## 2016-10-12 DIAGNOSIS — Z86010 Personal history of colonic polyps: Secondary | ICD-10-CM | POA: Diagnosis not present

## 2016-10-12 MED ORDER — SODIUM CHLORIDE 0.9 % IV SOLN: 500.0000 mL | INTRAVENOUS | Status: DC

## 2016-10-12 NOTE — Progress Notes (Signed)
Report to PACU, RN, vss, BBS= Clear.  

## 2016-10-12 NOTE — Op Note (Signed)
[place] Patient Name: Cynthia Padilla Procedure Date: 10/12/2016 11:02 AM MRN: [id number] Endoscopist: Docia Chuck. Henrene Pastor , MD Age: 66 Referring MD:  Date of Birth: 08-16-1950 Gender: Female Account #: [id number] Procedure:                Colonoscopyon the with cold snare polypectomy X2 Indications:              High risk colon cancer surveillance: Personal                            history of sessile serrated colon polyp (less than                            10 mm in size) with no dysplasia; Last examination                            2013 with SSP Medicines:                Monitored Anesthesia Care Procedure:                Pre-Anesthesia Assessment:                           - Prior to the procedure, a History and Physical                            was performed, and patient medications and                            allergies were reviewed. The patient's tolerance of                            previous anesthesia was also reviewed. The risks                            and benefits of the procedure and the sedation                            options and risks were discussed with the patient.                            All questions were answered, and informed consent                            was obtained. Prior Anticoagulants: The patient has                            taken no previous anticoagulant or antiplatelet                            agents. ASA Grade Assessment: II - A patient with                            mild systemic disease. After reviewing the risks  and benefits, the patient was deemed in                            satisfactory condition to undergo the procedure.                           After obtaining informed consent, the colonoscope                            was passed under direct vision. Throughout the                            procedure, the patient's blood pressure, pulse, and                            oxygen  saturations were monitored continuously. The                            Colonoscope was introduced through the anus and                            advanced to the the cecum, identified by                            appendiceal orifice and ileocecal valve. The                            ileocecal valve, appendiceal orifice, and rectum                            were photographed. The quality of the bowel                            preparation was excellent. The colonoscopy was                            performed without difficulty. The patient tolerated                            the procedure well. The bowel preparation used was                            SUPREP. Scope In: 11:18:32 AM Scope Out: 11:35:38 AM Scope Withdrawal Time: 0 hours 13 minutes 33 seconds  Total Procedure Duration: 0 hours 17 minutes 6 seconds  Findings:                 Two polyps were found in the sigmoid colon. The                            polyps were 3 mm in size. These polyps were removed                            with a cold snare. Resection and retrieval were  complete.                           Multiple diverticula were found in the left colon.                           Internal hemorrhoids were found during retroflexion.                           The exam was otherwise without abnormality on                            direct and retroflexion views. Complications:            No immediate complications. Estimated blood loss:                            None. Estimated Blood Loss:     Estimated blood loss: none. Impression:               - Two 3 mm polyps in the sigmoid colon, removed                            with a cold snare. Resected and retrieved.                           - Diverticulosis in the left colon.                           - Internal hemorrhoids.                           - The examination was otherwise normal on direct                            and retroflexion  views. Recommendation:           - Repeat colonoscopy in 5-10 years for surveillance.                           - Patient has a contact number available for                            emergencies. The signs and symptoms of potential                            delayed complications were discussed with the                            patient. Return to normal activities tomorrow.                            Written discharge instructions were provided to the                            patient.                           -  Resume previous diet.                           - Continue present medications.                           - Await pathology results. Docia Chuck. Henrene Pastor, MD 10/12/2016 11:40:35 AM This report has been signed electronically.

## 2016-10-12 NOTE — Patient Instructions (Signed)
   Handouts given: Polyps, and Diverticulosis.   YOU HAD AN ENDOSCOPIC PROCEDURE TODAY AT [facility]:   Refer to the procedure report that was given to you for any specific questions about what was found during the examination.  If the procedure report does not answer your questions, please call your gastroenterologist to clarify.  If you requested that your care partner not be given the details of your procedure findings, then the procedure report has been included in a sealed envelope for you to review at your convenience later.  YOU SHOULD EXPECT: Some feelings of bloating in the abdomen. Passage of more gas than usual.  Walking can help get rid of the air that was put into your GI tract during the procedure and reduce the bloating. If you had a lower endoscopy (such as a colonoscopy or flexible sigmoidoscopy) you may notice spotting of blood in your stool or on the toilet paper. If you underwent a bowel prep for your procedure, you may not have a normal bowel movement for a few days.  Please Note:  You might notice some irritation and congestion in your nose or some drainage.  This is from the oxygen used during your procedure.  There is no need for concern and it should clear up in a day or so.  SYMPTOMS TO REPORT IMMEDIATELY:   Following lower endoscopy (colonoscopy or flexible sigmoidoscopy):  Excessive amounts of blood in the stool  Significant tenderness or worsening of abdominal pains  Swelling of the abdomen that is new, acute  Fever of 100F or higher   For urgent or emergent issues, a gastroenterologist can be reached at any hour by calling [phone].   DIET:  We do recommend a small meal at first, but then you may proceed to your regular diet.  Drink plenty of fluids but you should avoid alcoholic beverages for 24 hours.  ACTIVITY:  You should plan to take it easy for the rest of today and you should NOT DRIVE or use heavy machinery until tomorrow  (because of the sedation medicines used during the test).    FOLLOW UP: Our staff will call the number listed on your records the next business day following your procedure to check on you and address any questions or concerns that you may have regarding the information given to you following your procedure. If we do not reach you, we will leave a message.  However, if you are feeling well and you are not experiencing any problems, there is no need to return our call.  We will assume that you have returned to your regular daily activities without incident.  If any biopsies were taken you will be contacted by phone or by letter within the next 1-3 weeks.  Please call us at [phone] if you have not heard about the biopsies in 3 weeks.    SIGNATURES/CONFIDENTIALITY: You and/or your care partner have signed paperwork which will be entered into your electronic medical record.  These signatures attest to the fact that that the information above on your After Visit Summary has been reviewed and is understood.  Full responsibility of the confidentiality of this discharge information lies with you and/or your care-partner.

## 2016-10-12 NOTE — Progress Notes (Signed)
Called to room to assist during endoscopic procedure.  Patient ID and intended procedure confirmed with present staff. Received instructions for my participation in the procedure from the performing physician.  

## 2016-10-13 ENCOUNTER — Telehealth: Payer: Self-pay | Admitting: *Deleted

## 2016-10-13 NOTE — Telephone Encounter (Signed)
  Follow up Call-  Call back number 10/12/2016 02/05/2015  Post procedure Call Back phone  # [phone]  Permission to leave phone message Yes Yes  Some recent data might be hidden     Patient questions:  Do you have a fever, pain , or abdominal swelling? No. Pain Score  0 *  Have you tolerated food without any problems? Yes.    Have you been able to return to your normal activities? Yes.    Do you have any questions about your discharge instructions: Diet   No. Medications  No. Follow up visit  No.  Do you have questions or concerns about your Care? No.  Actions: * If pain score is 4 or above: No action needed, pain <4.

## 2016-10-18 ENCOUNTER — Encounter: Payer: Self-pay | Admitting: Internal Medicine

## 2017-03-24 ENCOUNTER — Telehealth: Payer: Self-pay | Admitting: Internal Medicine

## 2017-03-24 NOTE — Telephone Encounter (Signed)
Pt states she has been having bloating and has tenderness right under the top of her rib cage that is tender to touch. Pt states she takes Nexium 40mg  bid and is still having problems. Pt is scheduled to see Amy Esterwood PA 04/06/17. Offered pt an appt next week with Tye Savoy NP but pt states she has a dentist appt that day. She will keep her appt as scheduled.

## 2017-04-06 ENCOUNTER — Ambulatory Visit: Payer: Medicare Other | Admitting: Physician Assistant

## 2017-04-26 ENCOUNTER — Encounter (INDEPENDENT_AMBULATORY_CARE_PROVIDER_SITE_OTHER): Payer: Medicare Other | Admitting: Ophthalmology

## 2017-04-26 DIAGNOSIS — H33301 Unspecified retinal break, right eye: Secondary | ICD-10-CM

## 2017-04-26 DIAGNOSIS — H35033 Hypertensive retinopathy, bilateral: Secondary | ICD-10-CM | POA: Diagnosis not present

## 2017-04-26 DIAGNOSIS — I10 Essential (primary) hypertension: Secondary | ICD-10-CM

## 2017-04-26 DIAGNOSIS — H43813 Vitreous degeneration, bilateral: Secondary | ICD-10-CM | POA: Diagnosis not present

## 2017-04-26 DIAGNOSIS — H2513 Age-related nuclear cataract, bilateral: Secondary | ICD-10-CM

## 2017-05-22 ENCOUNTER — Other Ambulatory Visit: Payer: Self-pay | Admitting: Obstetrics and Gynecology

## 2017-05-22 DIAGNOSIS — N63 Unspecified lump in unspecified breast: Secondary | ICD-10-CM

## 2017-06-22 ENCOUNTER — Encounter: Payer: Self-pay | Admitting: Physician Assistant

## 2017-06-22 ENCOUNTER — Ambulatory Visit: Payer: Medicare Other | Admitting: Physician Assistant

## 2017-06-22 ENCOUNTER — Other Ambulatory Visit (INDEPENDENT_AMBULATORY_CARE_PROVIDER_SITE_OTHER): Payer: Medicare Other

## 2017-06-22 VITALS — BP 118/72 | HR 70 | Ht 67.5 in | Wt 200.0 lb

## 2017-06-22 DIAGNOSIS — R1013 Epigastric pain: Secondary | ICD-10-CM

## 2017-06-22 DIAGNOSIS — K219 Gastro-esophageal reflux disease without esophagitis: Secondary | ICD-10-CM

## 2017-06-22 LAB — BASIC METABOLIC PANEL
BUN: 12 mg/dL (ref 6–23)
CALCIUM: 9.8 mg/dL (ref 8.4–10.5)
CO2: 33 meq/L — AB (ref 19–32)
CREATININE: 0.95 mg/dL (ref 0.40–1.20)
Chloride: 99 mEq/L (ref 96–112)
GFR: 62.11 mL/min (ref >60.00)
Glucose, Bld: 111 mg/dL — ABNORMAL HIGH (ref 70–99)
Potassium: 3.6 mEq/L (ref 3.5–5.1)
Sodium: 138 mEq/L (ref 135–145)

## 2017-06-22 MED ORDER — RANITIDINE HCL 150 MG PO TABS: 150.0000 mg | ORAL_TABLET | Freq: Two times a day (BID) | ORAL | 8 refills | Status: DC

## 2017-06-22 NOTE — Patient Instructions (Addendum)
Please go to the basement level to have your labs drawn.  We have sent the following medications to your pharmacy for you to pick up at your convenience: [facility] [employer], [address]. 1. Zantac 150 mg   You have been scheduled for a CT scan of the abdomen and pelvis at [facility] ([address]---this is in the same building as [facility]).   You are scheduled on Monday 11-26 at 8:30 am. You should arrive at 8:15 am to your appointment time for registration. Please follow the written instructions below on the day of your exam:  WARNING: IF YOU ARE ALLERGIC TO IODINE/X-RAY DYE, PLEASE NOTIFY RADIOLOGY IMMEDIATELY AT [phone]! YOU WILL BE GIVEN A 13 HOUR PREMEDICATION PREP.  1) Do not eat  anything after 4:30 am (4 hours prior to your test) 2) You have been given 2 bottles of oral contrast to drink. The solution may taste               better if refrigerated, but do NOT add ice or any other liquid to this solution. Shake             well before drinking.    Drink 1 bottle of contrast @ 6:30 am (2 hours prior to your exam)  Drink 1 bottle of contrast @ 7:30 am (1 hour prior to your exam)  You may take any medications as prescribed with a small amount of water except for the following: Metformin, Glucophage, Glucovance, Avandamet, Riomet, Fortamet, Actoplus Met, Janumet, Glumetza or Metaglip. The above medications must be held the day of the exam AND 48 hours after the exam.  The purpose of you drinking the oral contrast is to aid in the visualization of your intestinal tract. The contrast solution may cause some diarrhea. Before your exam is started, you will be given a small amount of fluid to drink. Depending on your individual set of symptoms, you may also receive an intravenous injection of x-ray contrast/dye. Plan on being at [facility] for 30 minutes or long, depending on the type of exam you are having performed.  If you have any questions regarding your  exam or if you need to reschedule, you may call the CT department at [phone] between the hours of 8:00 am and 5:00 pm, Monday-Friday.  ________________________________________________________________________

## 2017-06-22 NOTE — Progress Notes (Signed)
Initial assessment and plans reviewed 

## 2017-06-22 NOTE — Progress Notes (Signed)
Subjective:    Patient ID: Cynthia Padilla, female    DOB: [birth year], 66 y.o.   MRN: [id number]  HPI Cynthia Padilla is a pleasant 66 year old white female, known to Dr. Henrene Pastor who was last seen in our office in January 2018.  She has diagnosis of GERD and IBS.  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 She last had colonoscopy March 2018 finding of 2 small sigmoid colon polyps both of which were removed and found to be hyperplastic, also with multiple left colon diverticuli and internal hemorrhoids. EGD in 2016 was normal,  Upper abdominal ultrasound June 2016 normal. Patient comes in today with complaints of soreness in her epigastrium which she has been having off and on over the past couple of months.  She has been trying to wean herself off of Nexium because of concerns for long-term side effects.  She is currently taking 40 mg about every third day.  She says she has lost weight intentionally and has been trying to manage her diet as to decrease reflux symptoms. Over the past months she has been having persistent soreness in the epigastrium which seems to be worse at night sometimes after going to bed this will wake her up.  She has not been having a lot of heartburn or indigestion, no dysphagia.  Appetite has been okay and intentionally eating smaller meals.  She does notice at times having worsening of epigastric discomfort postprandially. She also mentions that she has been stressed as her husband has been ill and just got out of the hospital. No regular NSAID use.  Review of Systems Pertinent positive and negative review of systems were noted in the above HPI section.  All other review of systems was otherwise negative.  Outpatient Encounter Medications as of 06/22/2017  Medication Sig  . Cholecalciferol (VITAMIN D-3 PO) Take 1,200 mg by mouth daily.  . Coenzyme Q10 (COQ-10) 150 MG CAPS 150 mg daily.   .  hydrochlorothiazide (MICROZIDE) 12.5 MG capsule Take 12.5 mg by mouth 2 (two) times daily.   . meclizine (ANTIVERT) 25 MG tablet Take 1 tablet (25 mg total) by mouth 3 (three) times daily as needed.  Marland Kitchen NEXIUM 40 MG capsule TAKE 1 CAPSULE BY MOUTH  TWICE A DAY BEFORE MEALS  . ranitidine (ZANTAC) 150 MG tablet Take 1 tablet (150 mg total) 2 (two) times daily by mouth.  . [DISCONTINUED] hyoscyamine (LEVSIN SL) 0.125 MG SL tablet 1-2 tablets sublingually every 4 - 6 hours as needed (Patient not taking: Reported on 10/12/2016)   Facility-Administered Encounter Medications as of 06/22/2017  Medication  . 0.9 %  sodium chloride infusion   No Known Allergies Patient Active Problem List   Diagnosis Date Noted  . Dizziness 03/06/2014  . Chest discomfort 07/15/2013  . Pulmonary nodule 04/02/2012  . 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 Hyperglycemia 04/01/2011  . HYPERLIPIDEMIA 10/11/2007  . HYPERTENSION 10/11/2007  . GERD 10/11/2007  . COLONIC POLYPS, HX OF 10/11/2007   Social History   Socioeconomic History  . Marital status: Married    Spouse name: Not on file  . Number of children: 0  . Years of education: Not on file  . Highest education level: Not on file  Social Needs  . Financial resource strain: Not on file  . Food insecurity - worry: Not on file  . Food insecurity - inability: Not on file  . Transportation needs -  medical: Not on file  . Transportation needs - non-medical: Not on file  Occupational History  . Occupation: reitred    Fish farm manager: [employer]  Tobacco Use  . Smoking status: Never Smoker  . Smokeless tobacco: Never Used  Substance and Sexual Activity  . Alcohol use: Yes    Alcohol/week: 1.8 oz    Types: 3 Glasses of wine per week  . Drug use: No  . Sexual activity: Not on file  Other Topics Concern  . Not on file  Social History Narrative  . Not on file    Ms. Peth's family history includes Diabetes in her brother, father, and sister; Heart  disease in her father; Leukemia in her father; Lung cancer in her mother; Ovarian cancer in her mother; Rectal cancer in her sister.      Objective:    Vitals:   06/22/17 1443  BP: 118/72  Pulse: 70    Physical Exam; well-developed older white female in no acute distress, pleasant blood pressure 118/72, pulse 70, height 5 foot 7, weight 200, BMI 30.8.  HEENT ;nontraumatic normocephalic EOMI PERRLA sclerae anicteric, Cardiovascular; regular rate and rhythm with S1-S2 no murmur rub or gallop, Pulmonary; clear bilaterally, Abdomen; soft, bowel sounds are present no palpable mass or hepatosplenomegaly she is tender in the epigastrium and hypogastrium no guarding, Rectal ;exam not done, Extremities; no clubbing cyanosis or edema skin warm dry, Neuro psych; mood and affect appropriate       Assessment & Plan:   #10 66 year old white female with history of GERD, negative EGD 2016 who has been slowly weaning off of Nexium, now presenting with soreness in the epigastrium over the past couple of months, sometimes worse postprandially and frequently worse at night. Patient feels the symptoms are different than her reflux symptoms. Rule out gallbladder disease, pancreatic disease, underlying neoplasm, rule out gastropathy  #2 history of colon polyps, last colonoscopy March 2018 no adenomatous polyps found, she had 2 diminutive hyperplastic polyps #3.  Diverticulosis #4.  History of high-grade AIN-followed by Dr. Excell Seltzer #5 GERD  Plan; stop Nexium Start trial of Zantac 150 mg p.o. q. evening.  She may take twice daily if needed Schedule for CT of the abdomen with contrast. Further plans pending results of above.   Amy S Esterwood PA-C 06/22/2017   Cc: Velna Hatchet, MD

## 2017-07-03 ENCOUNTER — Inpatient Hospital Stay: Admission: RE | Admit: 2017-07-03 | Payer: Medicare Other | Source: Ambulatory Visit

## 2017-07-04 ENCOUNTER — Ambulatory Visit (INDEPENDENT_AMBULATORY_CARE_PROVIDER_SITE_OTHER)
Admission: RE | Admit: 2017-07-04 | Discharge: 2017-07-04 | Disposition: A | Discharge status: Home / Self Care | Payer: Medicare Other | Source: Ambulatory Visit | Attending: Physician Assistant | Admitting: Physician Assistant

## 2017-07-04 DIAGNOSIS — K219 Gastro-esophageal reflux disease without esophagitis: Secondary | ICD-10-CM

## 2017-07-04 DIAGNOSIS — R1013 Epigastric pain: Secondary | ICD-10-CM | POA: Diagnosis not present

## 2017-07-04 IMAGING — CT CT ABD-PELV W/ CM
2 of 5 series · 16 of 46 positions shown, 18 images · IV contrast (ISOVUE 300)
Comparison: CT abdomen pelvis dated [DATE].

CLINICAL DATA: Epigastric pain for a few months.  History of GERD.

EXAM:
CT ABDOMEN AND PELVIS WITH CONTRAST
TECHNIQUE: Multidetector CT imaging of the abdomen and pelvis was performed
using the standard protocol following bolus administration of
intravenous contrast.
CONTRAST:  100mL [98] IOPAMIDOL ([98]) INJECTION 61%

[Series 2: abd/pel w · axial · 0.79mm/px · z∈[-495,-60]mm · 13 of 99 slices shown, 15 images]
[im 6/99  soft-tissue]
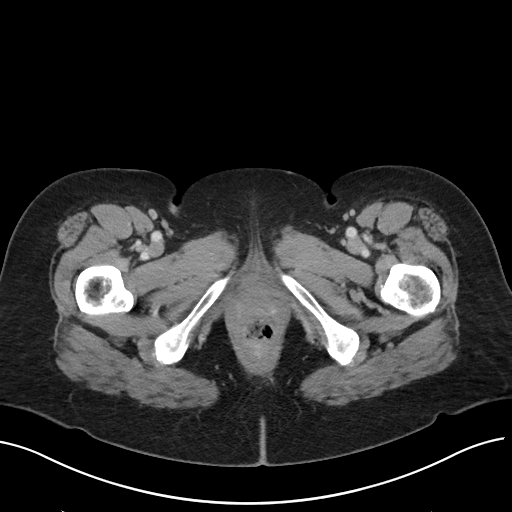
[im 6/99  bone]
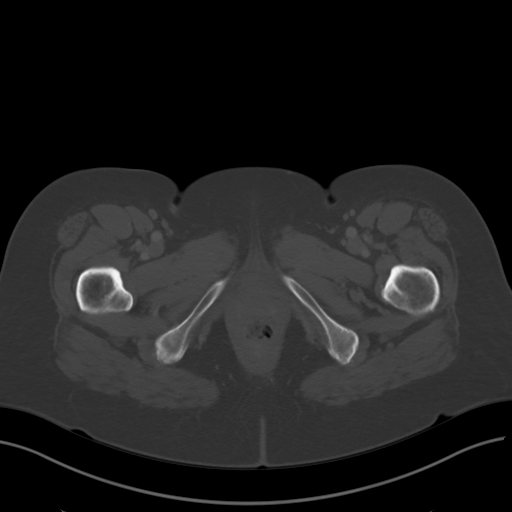
[im 16/99  soft-tissue]
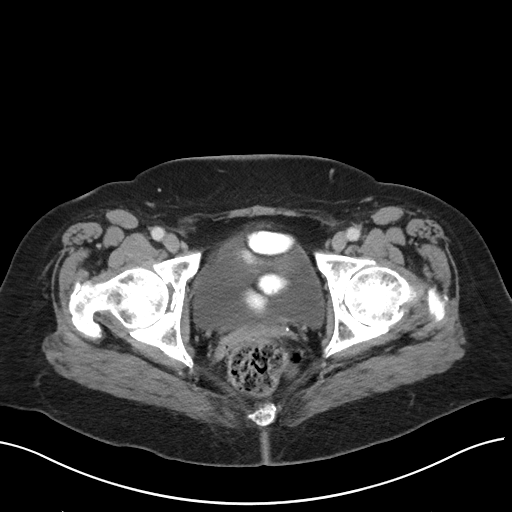
[im 21/99  soft-tissue]
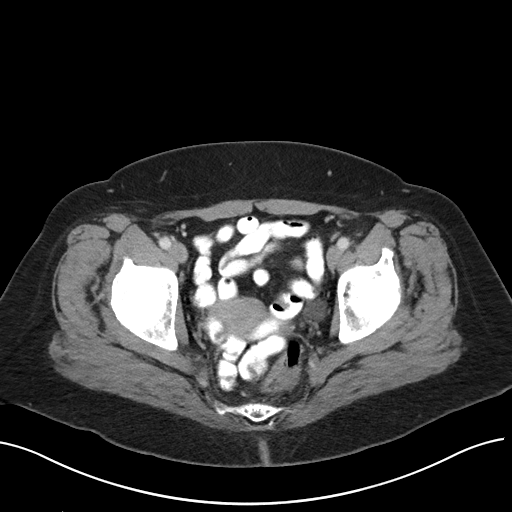
[im 26/99  soft-tissue]
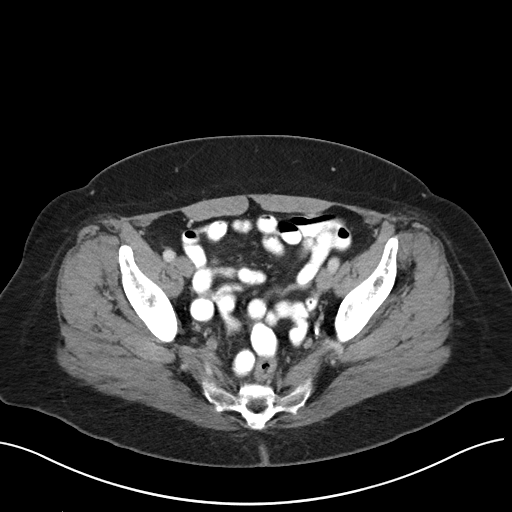
[im 37/99  soft-tissue]
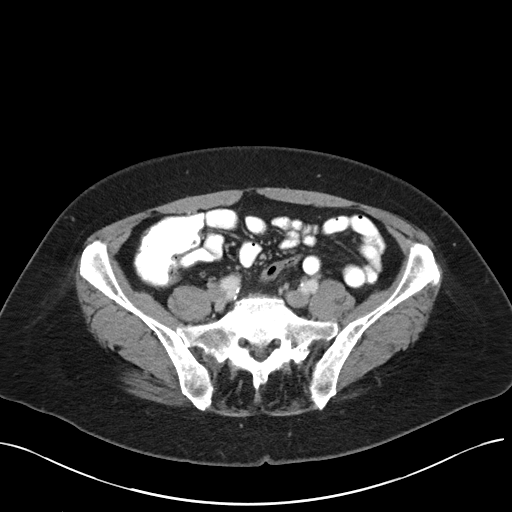
[im 42/99  soft-tissue]
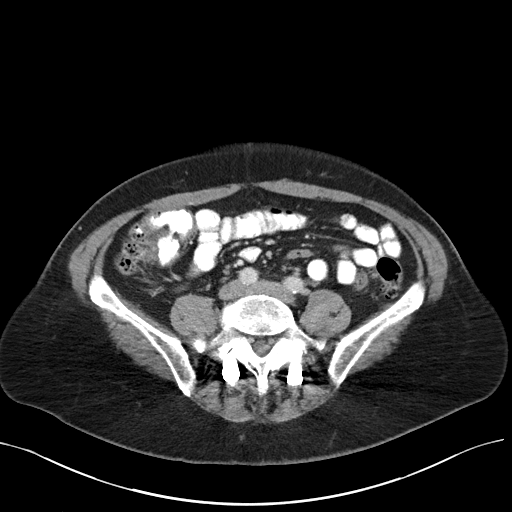
[im 52/99  soft-tissue]
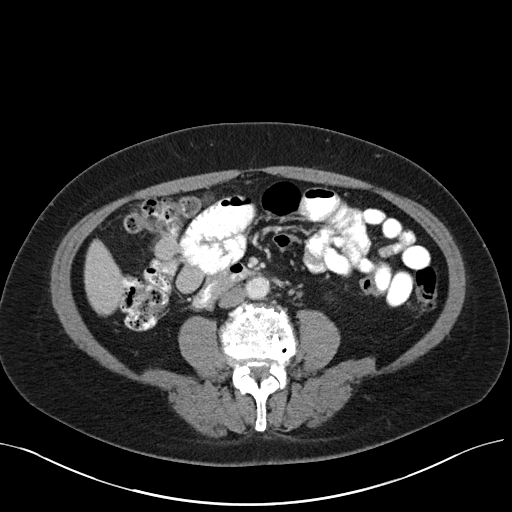
[im 57/99  soft-tissue]
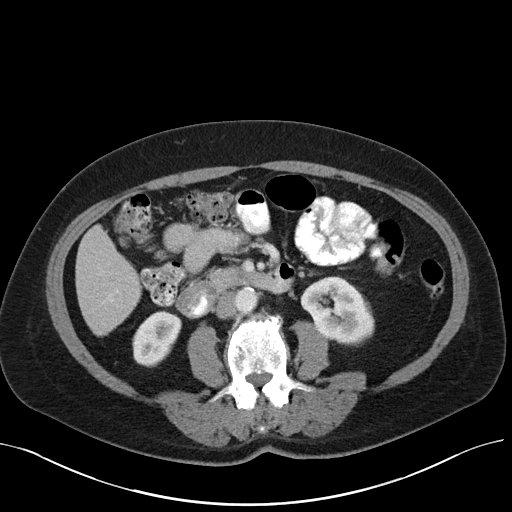
[im 62/99  soft-tissue]
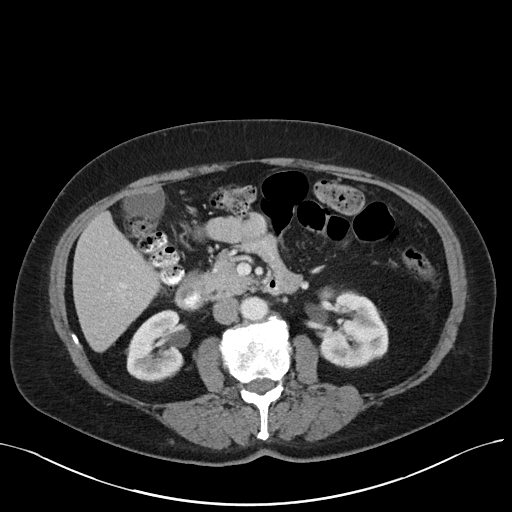
[im 62/99  bone]
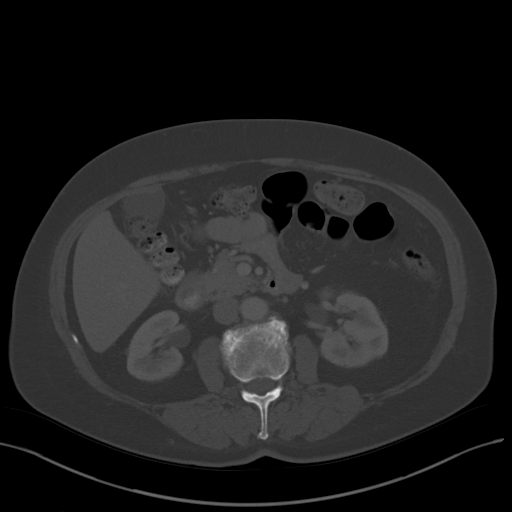
[im 73/99  soft-tissue]
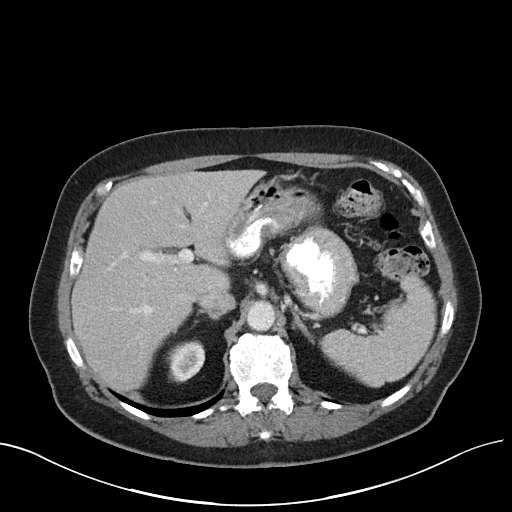
[im 78/99  soft-tissue]
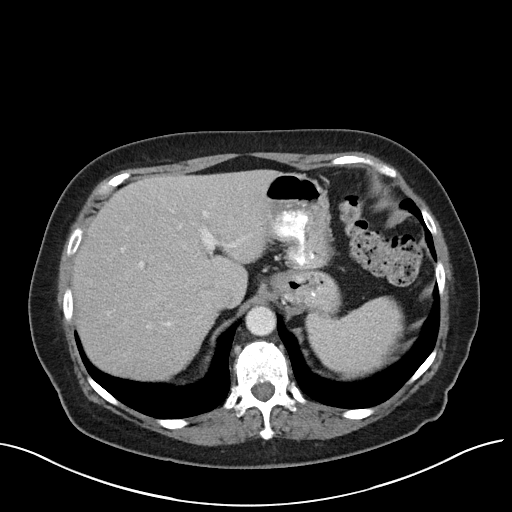
[im 83/99  soft-tissue]
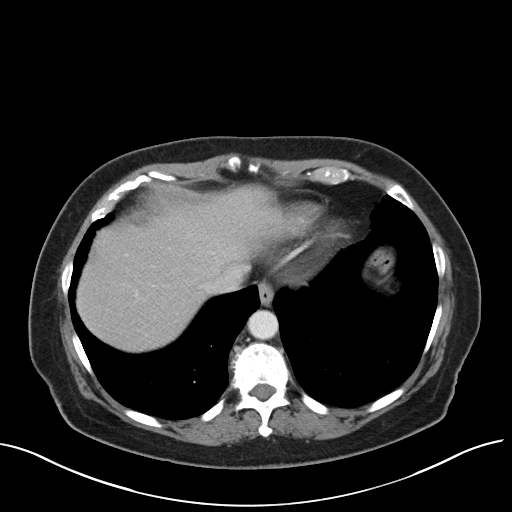
[im 93/99  soft-tissue]
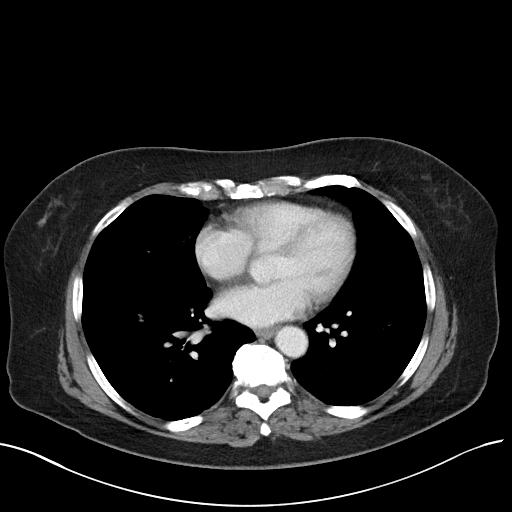

[Series 6: abd/pel w st · coronal · 0.68mm/px · 3 of 84 slices shown]
[im 28/84  soft-tissue]
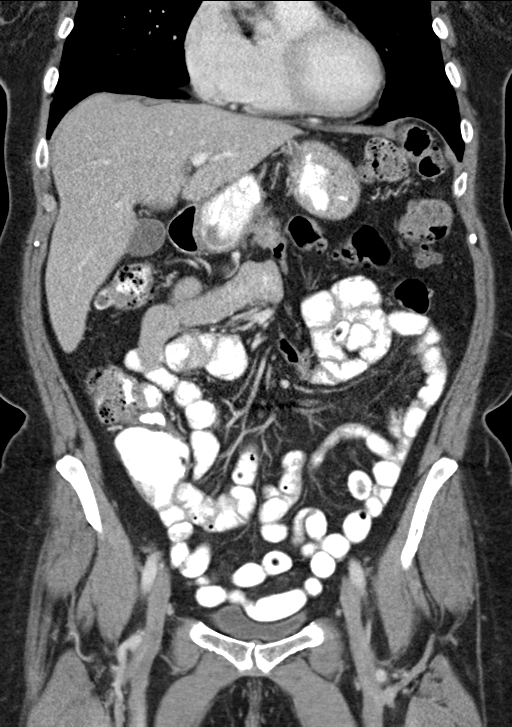
[im 37/84  soft-tissue]
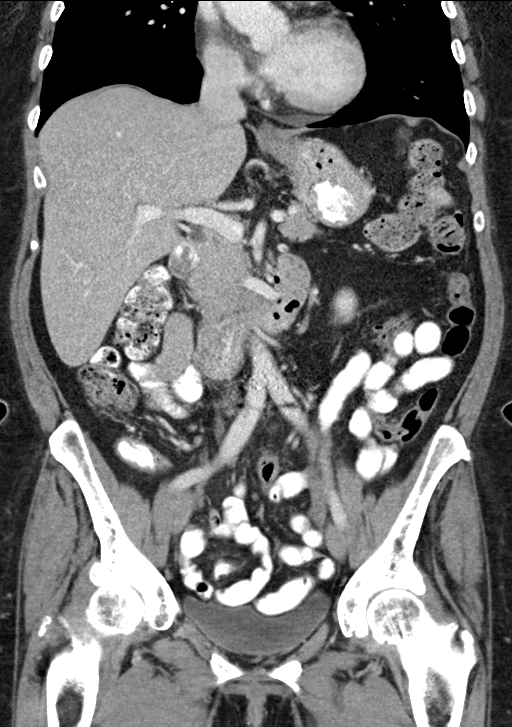
[im 47/84  soft-tissue]
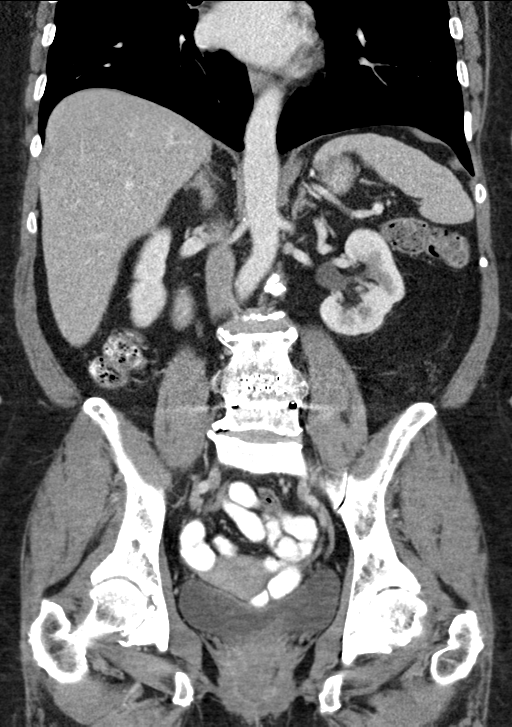

[16 of 46 positions shown; findings below may reference images not displayed]

FINDINGS: Lower chest: No acute abnormality.

Hepatobiliary: No focal liver abnormality is seen. No gallstones,
gallbladder wall thickening, or biliary dilatation.

Pancreas: Unremarkable. No pancreatic ductal dilatation or
surrounding inflammatory changes.

Spleen: Normal in size without focal abnormality.

Adrenals/Urinary Tract: Adrenal glands are unremarkable. Kidneys are
normal, without renal calculi, focal lesion, or hydronephrosis.
Bladder is unremarkable.

Stomach/Bowel: Stomach is within normal limits. Appendix appears
normal. No evidence of bowel wall thickening, distention, or
inflammatory changes.

Vascular/Lymphatic: Mild aortic atherosclerosis. No enlarged
abdominal or pelvic lymph nodes.

Reproductive: Uterus and bilateral adnexa are unremarkable.

Other: No abdominal wall hernia or abnormality. No abdominopelvic
ascites. No pneumoperitoneum.

Musculoskeletal: No acute or significant osseous findings. Prior
L4-L5 posterior and interbody fusion. Degenerative changes of the
lumbar spine.
IMPRESSION: 1. No acute intra-abdominal process. No explanation for the
patient's symptoms.
2.  Aortic atherosclerosis ([98]-[98]).

## 2017-07-04 MED ORDER — IOPAMIDOL (ISOVUE-300) INJECTION 61%
100.0000 mL | Freq: Once | INTRAVENOUS | Status: AC | PRN
  Administered 2017-07-04: 100 mL via INTRAVENOUS

## 2017-07-05 ENCOUNTER — Encounter: Payer: Self-pay | Admitting: Physician Assistant

## 2017-07-26 ENCOUNTER — Ambulatory Visit
Admission: RE | Admit: 2017-07-26 | Discharge: 2017-07-26 | Disposition: A | Discharge status: Home / Self Care | Payer: Medicare Other | Source: Ambulatory Visit | Attending: Obstetrics and Gynecology | Admitting: Obstetrics and Gynecology

## 2017-07-26 DIAGNOSIS — N63 Unspecified lump in unspecified breast: Secondary | ICD-10-CM

## 2017-07-31 ENCOUNTER — Other Ambulatory Visit: Payer: Self-pay | Admitting: Internal Medicine

## 2017-08-16 DIAGNOSIS — R69 Illness, unspecified: Secondary | ICD-10-CM | POA: Diagnosis not present

## 2017-10-24 DIAGNOSIS — H6123 Impacted cerumen, bilateral: Secondary | ICD-10-CM | POA: Insufficient documentation

## 2017-12-04 DIAGNOSIS — H2513 Age-related nuclear cataract, bilateral: Secondary | ICD-10-CM | POA: Diagnosis not present

## 2017-12-04 DIAGNOSIS — H5203 Hypermetropia, bilateral: Secondary | ICD-10-CM | POA: Diagnosis not present

## 2017-12-04 DIAGNOSIS — H524 Presbyopia: Secondary | ICD-10-CM | POA: Diagnosis not present

## 2017-12-04 DIAGNOSIS — H33301 Unspecified retinal break, right eye: Secondary | ICD-10-CM | POA: Diagnosis not present

## 2017-12-05 ENCOUNTER — Emergency Department (HOSPITAL_COMMUNITY): Payer: Medicare HMO

## 2017-12-05 ENCOUNTER — Emergency Department (HOSPITAL_COMMUNITY)
Admission: EM | Admit: 2017-12-05 | Discharge: 2017-12-05 | Disposition: A | Discharge status: Home / Self Care | Payer: Medicare HMO | Attending: Emergency Medicine | Admitting: Emergency Medicine

## 2017-12-05 ENCOUNTER — Encounter (HOSPITAL_COMMUNITY): Payer: Self-pay | Admitting: Emergency Medicine

## 2017-12-05 DIAGNOSIS — S59902A Unspecified injury of left elbow, initial encounter: Secondary | ICD-10-CM | POA: Diagnosis not present

## 2017-12-05 DIAGNOSIS — S0101XA Laceration without foreign body of scalp, initial encounter: Secondary | ICD-10-CM

## 2017-12-05 DIAGNOSIS — W19XXXA Unspecified fall, initial encounter: Secondary | ICD-10-CM

## 2017-12-05 DIAGNOSIS — Z79899 Other long term (current) drug therapy: Secondary | ICD-10-CM | POA: Diagnosis not present

## 2017-12-05 DIAGNOSIS — Y999 Unspecified external cause status: Secondary | ICD-10-CM | POA: Diagnosis not present

## 2017-12-05 DIAGNOSIS — M25522 Pain in left elbow: Secondary | ICD-10-CM | POA: Diagnosis not present

## 2017-12-05 DIAGNOSIS — I10 Essential (primary) hypertension: Secondary | ICD-10-CM | POA: Diagnosis not present

## 2017-12-05 DIAGNOSIS — S0990XA Unspecified injury of head, initial encounter: Secondary | ICD-10-CM | POA: Diagnosis not present

## 2017-12-05 DIAGNOSIS — W01198A Fall on same level from slipping, tripping and stumbling with subsequent striking against other object, initial encounter: Secondary | ICD-10-CM | POA: Insufficient documentation

## 2017-12-05 DIAGNOSIS — J45909 Unspecified asthma, uncomplicated: Secondary | ICD-10-CM | POA: Insufficient documentation

## 2017-12-05 DIAGNOSIS — Y929 Unspecified place or not applicable: Secondary | ICD-10-CM | POA: Insufficient documentation

## 2017-12-05 DIAGNOSIS — Y93A3 Activity, aerobic and step exercise: Secondary | ICD-10-CM | POA: Insufficient documentation

## 2017-12-05 DIAGNOSIS — S0181XA Laceration without foreign body of other part of head, initial encounter: Secondary | ICD-10-CM | POA: Diagnosis present

## 2017-12-05 IMAGING — CR DG ELBOW COMPLETE 3+V*L*
4 series · 4 of 4 positions shown · non-contrast
Comparison: None.

CLINICAL DATA: Left elbow pain after fall.

EXAM:
LEFT ELBOW - COMPLETE 3+ VIEW

[x elbow ap left]
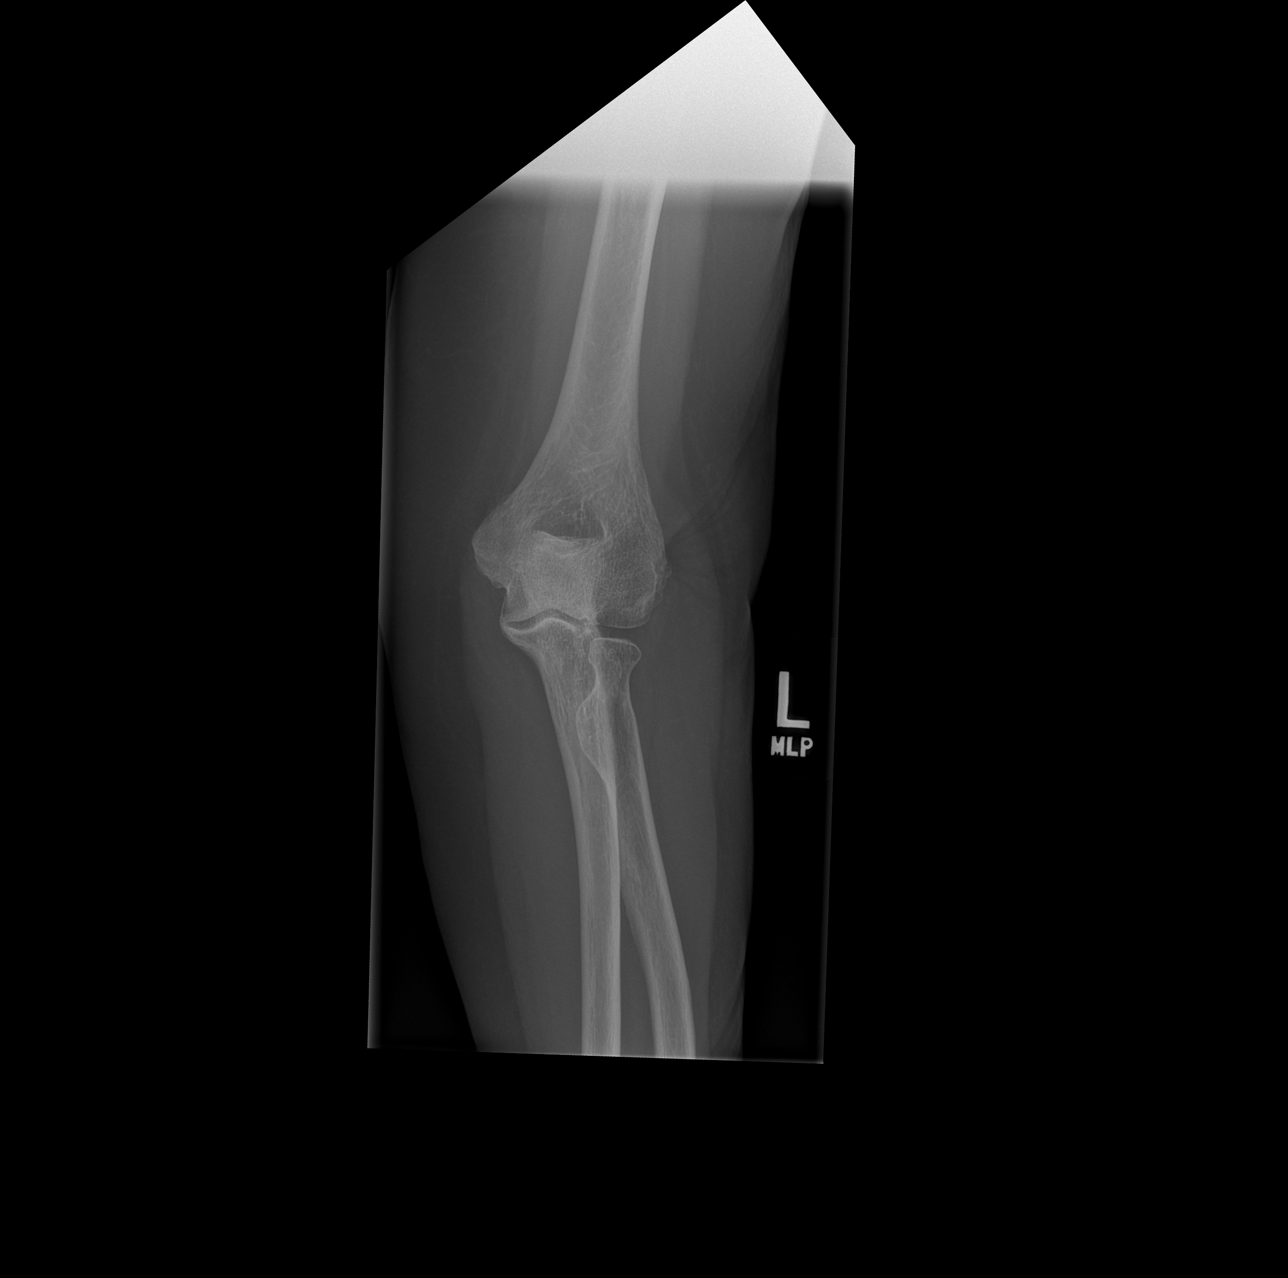

[x elbow obl left (1 of 2)]
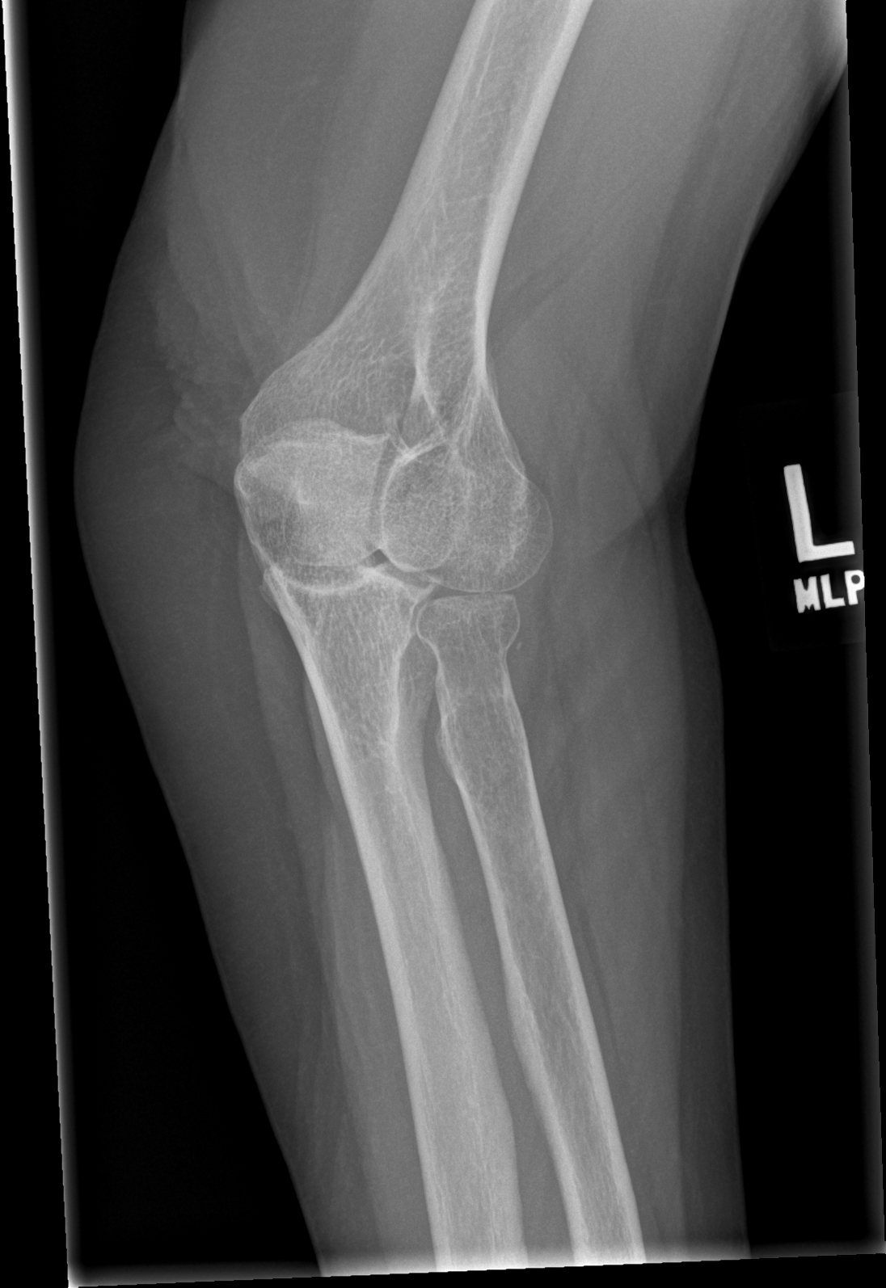

[x elbow obl left (2 of 2)]
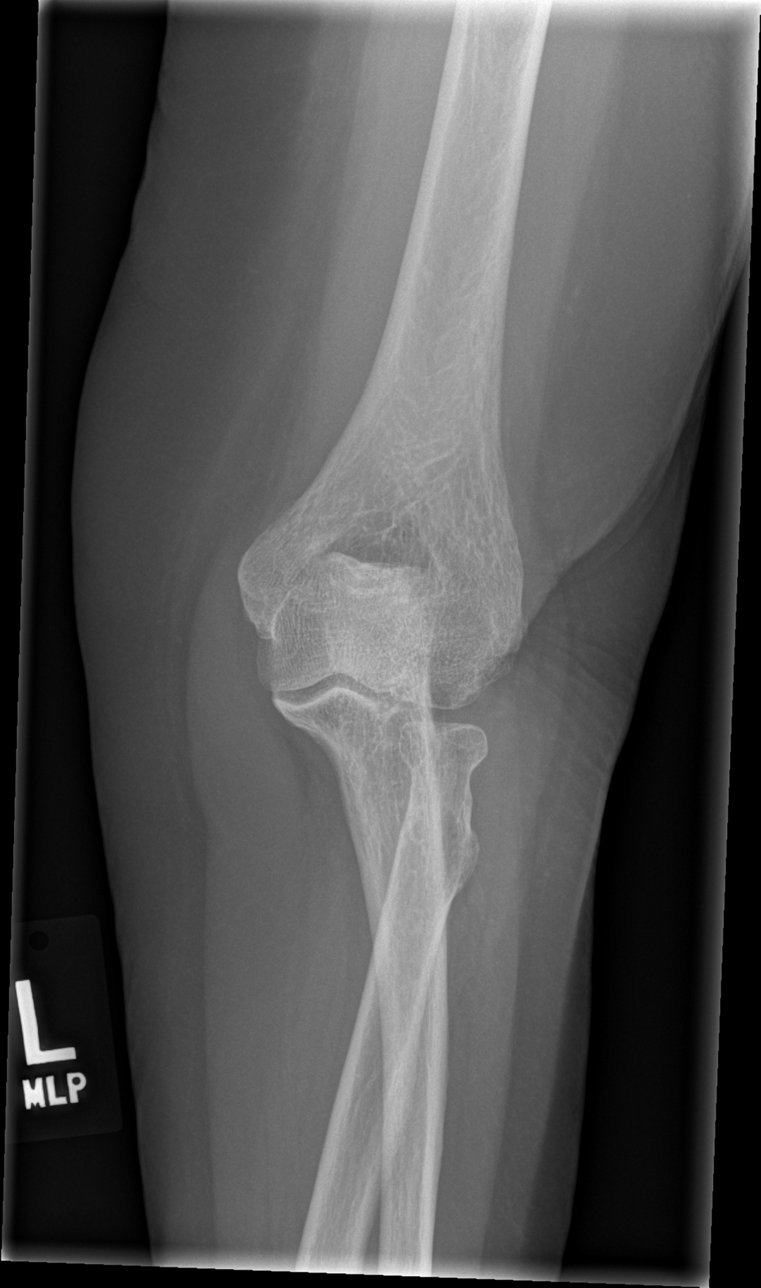

[x elbow lat left]
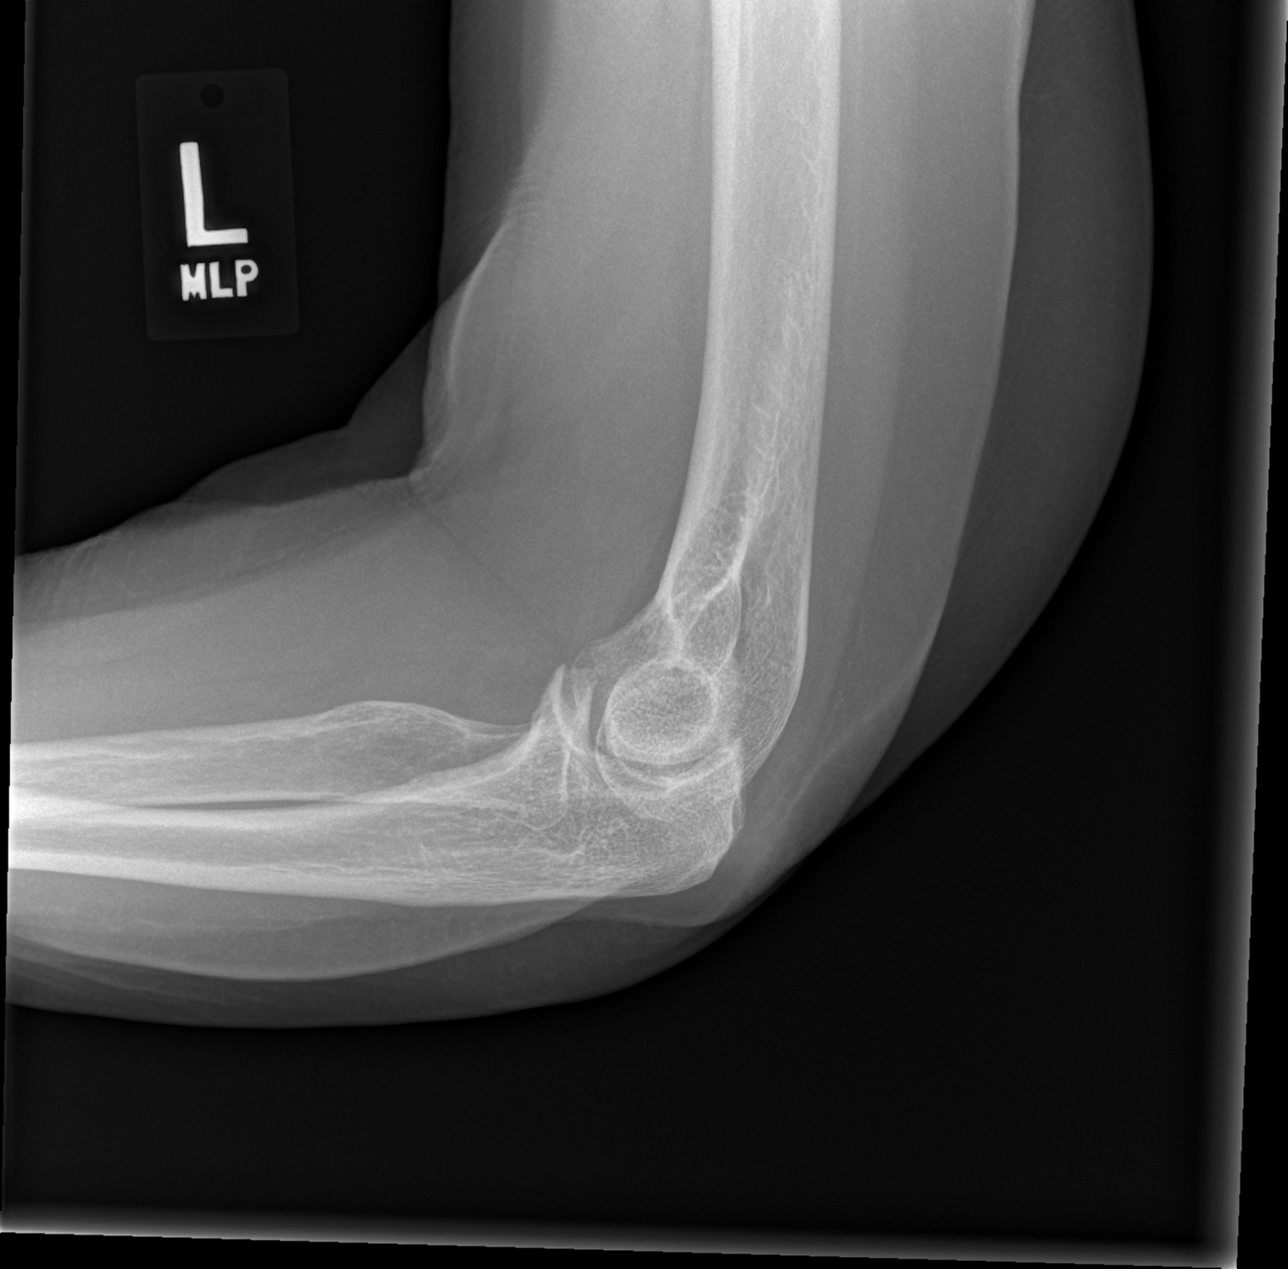

[4 of 4 positions shown; findings below may reference images not displayed]

FINDINGS: There is no evidence of fracture, dislocation, or joint effusion.
There is no evidence of arthropathy or other focal bone abnormality.
Soft tissues are unremarkable.
IMPRESSION: Normal left elbow.

## 2017-12-05 IMAGING — CT CT HEAD W/O CM
3 of 4 series · 14 of 47 positions shown, 16 images · non-contrast
Comparison: None.

CLINICAL DATA: Fell and hit her head on a piece of metal this
morning at [employer]. 1/2 inch laceration posterior scalp.

EXAM:
CT HEAD WITHOUT CONTRAST
TECHNIQUE: Contiguous axial images were obtained from the base of the skull
through the vertex without intravenous contrast.

[Series 2: head wo · axial · 0.47mm/px · z∈[-130,-10]mm · 8 of 30 slices shown, 10 images]
[im 3/30  brain]
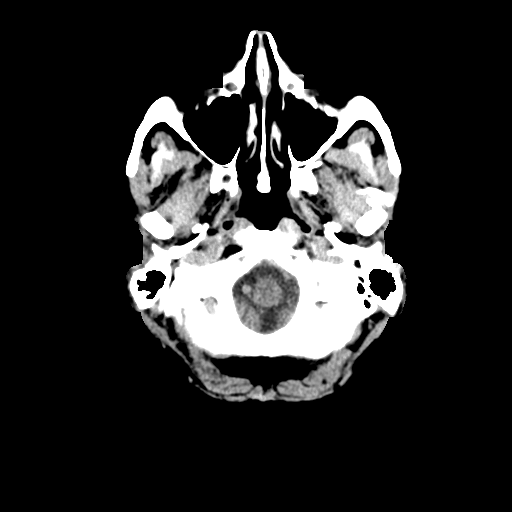
[im 3/30  bone]
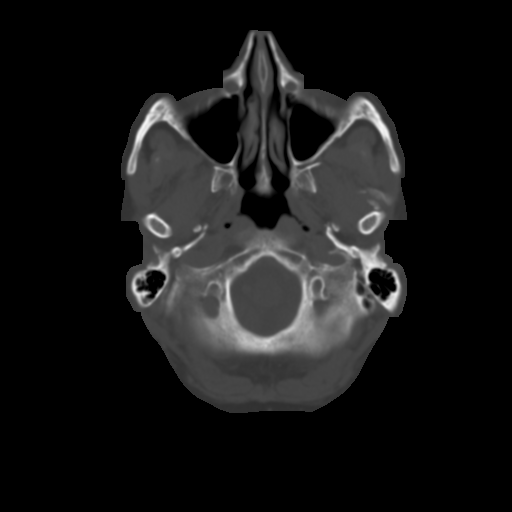
[im 6/30  brain]
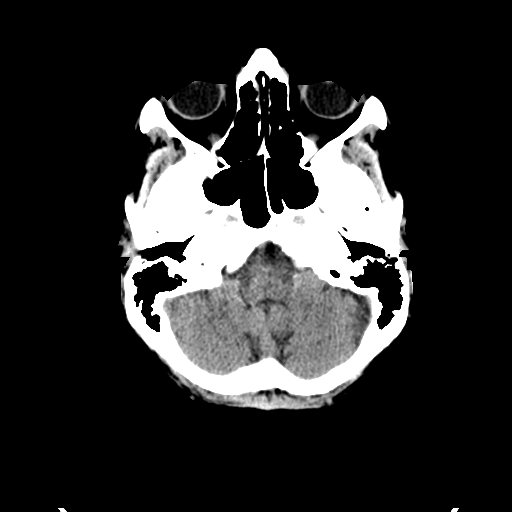
[im 9/30  brain]
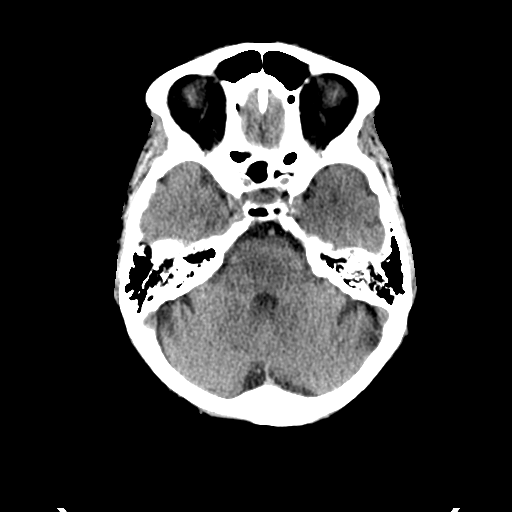
[im 12/30  brain]
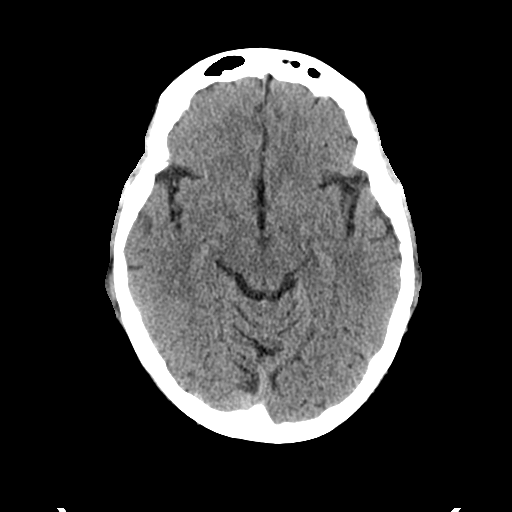
[im 18/30  brain]
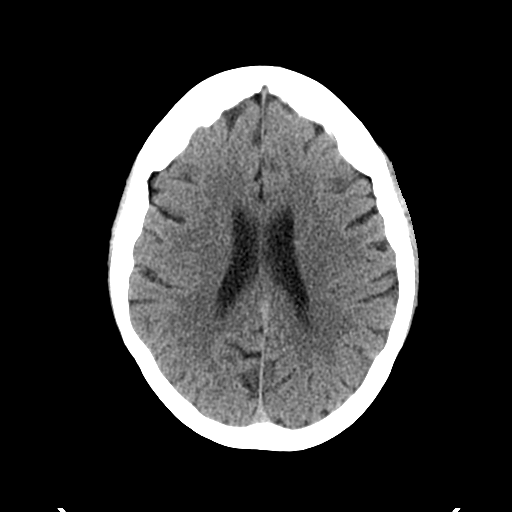
[im 18/30  bone]
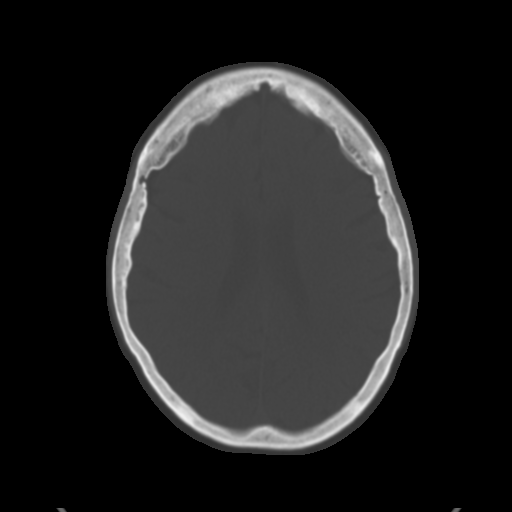
[im 21/30  brain]
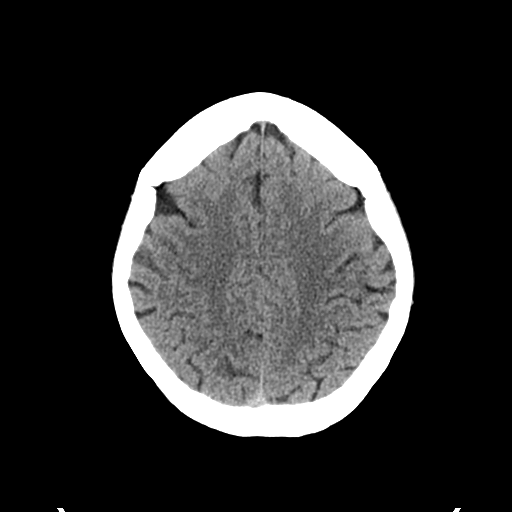
[im 24/30  brain]
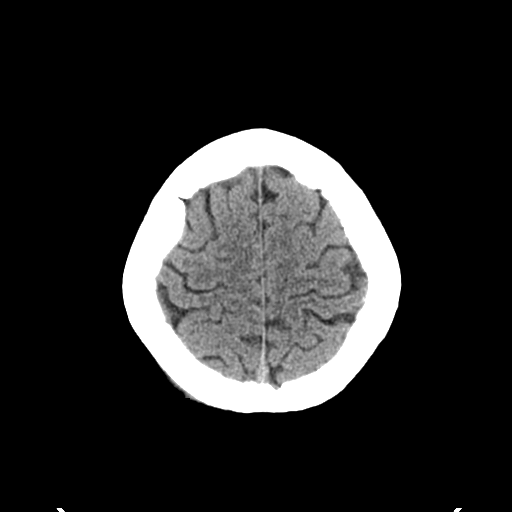
[im 27/30  brain]
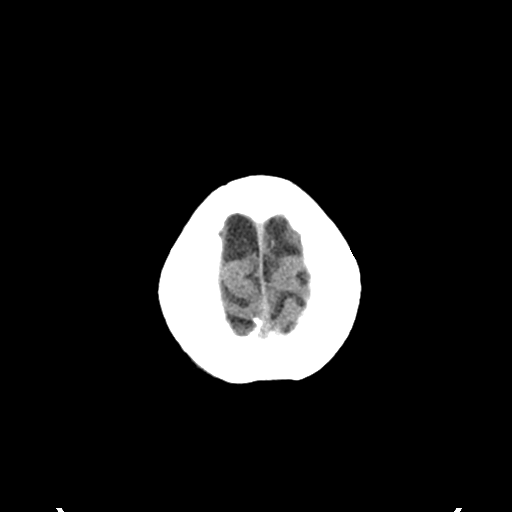

[Series 5: coronal soft tissue · coronal · 0.29mm/px · 3 of 70 slices shown]
[im 24/70  brain]
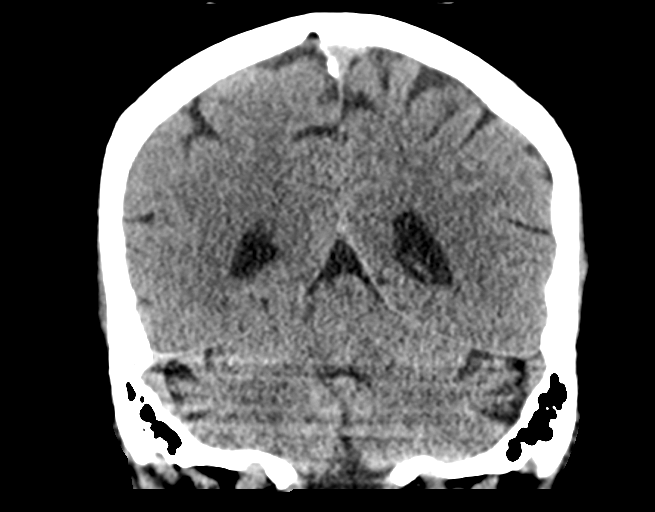
[im 31/70  brain]
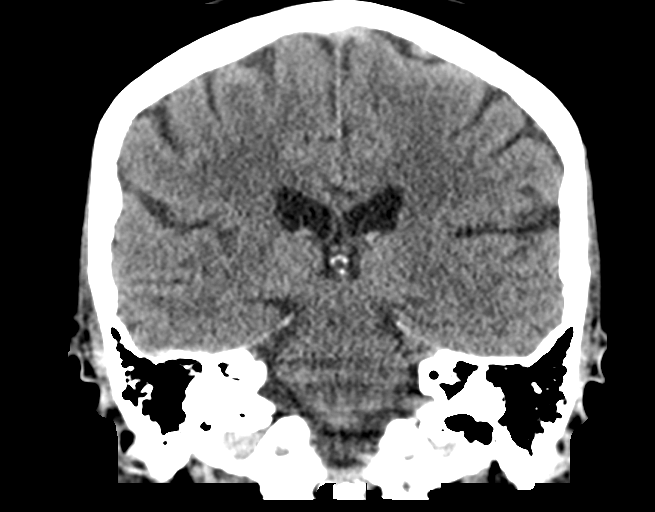
[im 39/70  brain]
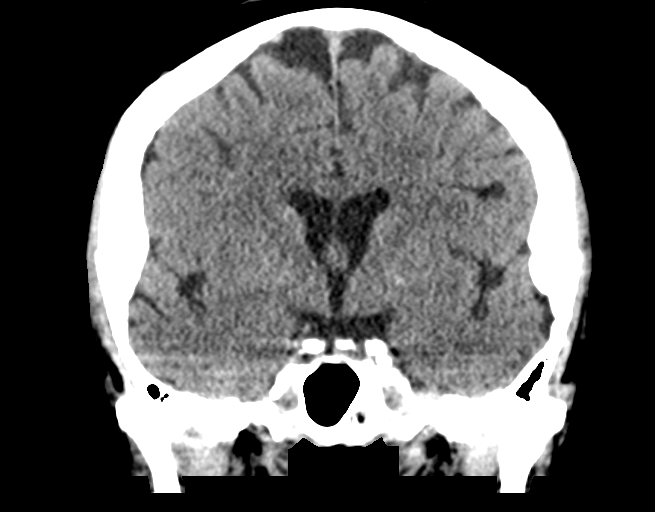

[Series 6: sagittal soft tissue · sagittal · 0.30mm/px · 3 of 57 slices shown]
[im 19/57  brain]
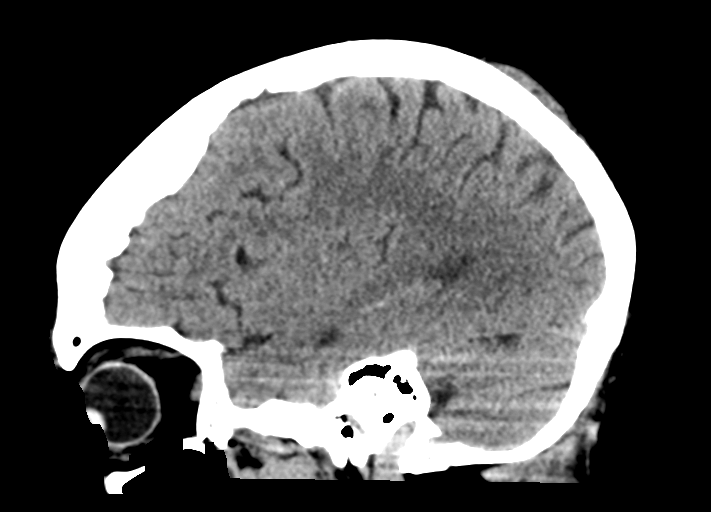
[im 29/57  brain]
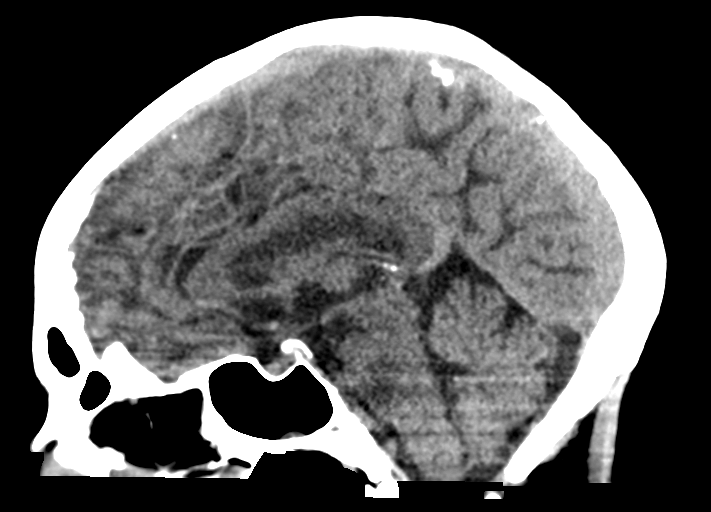
[im 38/57  brain]
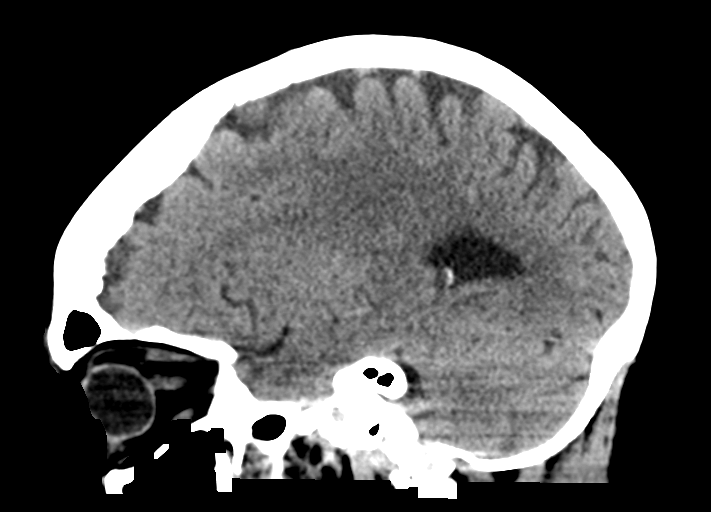

[14 of 47 positions shown; findings below may reference images not displayed]

FINDINGS: Brain: Diffusely enlarged ventricles and subarachnoid spaces. Patchy
white matter low density in both cerebral hemispheres. No
intracranial hemorrhage, mass lesion or CT evidence of acute
infarction.

Vascular: No hyperdense vessel or unexpected calcification.

Skull: Normal. Negative for fracture or focal lesion.

Sinuses/Orbits: Retained secretions and minimal mucosal thickening
in the left maxillary sinus and mild mucosal thickening in the
sphenoid sinus on the right. Unremarkable orbits.

Other: Mild right posterior scalp irregularity and small amount of
underlying scalp hemorrhage.
IMPRESSION: 1. Small right posterior scalp laceration without skull fracture or
intracranial hemorrhage.
2. Minimal diffuse cerebral and cerebellar atrophy.
3. Minimal chronic small vessel white matter ischemic changes in
both cerebral hemispheres.
4. Mild chronic sinusitis.

## 2017-12-05 MED ORDER — LIDOCAINE-EPINEPHRINE (PF) 2 %-1:200000 IJ SOLN: 20.0000 mL | Freq: Once | INTRAMUSCULAR | Status: DC

## 2017-12-05 MED ORDER — LIDOCAINE-EPINEPHRINE 2 %-1:100000 IJ SOLN
20.0000 mL | Freq: Once | INTRAMUSCULAR | Status: AC
  Administered 2017-12-05: 20 mL via INTRADERMAL
  Filled 2017-12-05: qty 20

## 2017-12-05 MED ORDER — LIDOCAINE-EPINEPHRINE 2 %-1:100000 IJ SOLN
1.7000 mL | Freq: Once | INTRAMUSCULAR | Status: DC
  Filled 2017-12-05: qty 1.7

## 2017-12-05 MED ORDER — ACETAMINOPHEN 325 MG PO TABS
650.0000 mg | ORAL_TABLET | Freq: Once | ORAL | Status: AC
  Administered 2017-12-05: 650 mg via ORAL
  Filled 2017-12-05: qty 2

## 2017-12-05 NOTE — Discharge Instructions (Signed)
Please make sure to keep the wound clean and dry.  Please return to your primary care, urgent care, or the emergency department for suture removal in 7 to 10 days.  Please follow-up with your primary care provider in about 1 week for reevaluation regardless.  Return to the emergency department for any worsening symptoms including confusion, memory problems, persistent headaches, lightheadedness vision changes, signs of infection to your wound.

## 2017-12-05 NOTE — ED Provider Notes (Addendum)
[facility] DEPT Provider Note   CSN: [id number] Arrival date & time: 12/05/17  1110     History   Chief Complaint Chief Complaint  Patient presents with  . Fall  . Laceration    HPI Cynthia Padilla is a 67 y.o. female.  HPI   Patient is a 67 year old female with history of hypertension, hyperlipidemia who presents the ED today after a fall that occurred prior to arrival.  Patient states that she was doing aerobic exercises and she was kneeling on a machine that was about 3 feet off the ground when she lost her balance and fell onto her right side.  She hit her right shoulder and her head on the ground.  She also hit her left elbow and now has left elbow pain. Denies right shoulder pain. No numbness or weakness to the arms or legs.  She did not lose consciousness.  She denies any neck or back pain.  No chest pain or shortness of breath.  No abdominal pain, nausea vomiting diarrhea or urinary symptoms.  She reports a laceration to the posterior aspect of her head with no active bleeding.  She has a mild headache but otherwise denies any dizziness or lightheadedness.  No vision changes.  No amnesia to the event.  States that her Tdap is up-to-date.  She is not anticoagulated.  She has not tried taking any medication for her pain prior to arrival.  Past Medical History:  Diagnosis Date  . Anxiety   . Arthritis    thinks she has some in her spine, bursitis in hips  . Asthma    had it once when she had episode of bronchitis about 10 yrs ago  . Diverticulitis   . DIVERTICULOSIS, COLON 11/04/2004   Qualifier: Diagnosis of  By: Hardin Negus CMA (AAMA), [place]    . GERD (gastroesophageal reflux disease)   . Hepatitis    thinks she had Hepatits as a child  . History of colonic polyps   . Hyperlipidemia   . Hypertension   . Internal hemorrhoids   . PONV (postoperative nausea and vomiting)    states she gets deathly sick  . Vertigo     Patient Active  Problem List   Diagnosis Date Noted  . Dizziness 03/06/2014  . Chest discomfort 07/15/2013  . Pulmonary nodule 04/02/2012  . 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 Hyperglycemia 04/01/2011  . HYPERLIPIDEMIA 10/11/2007  . HYPERTENSION 10/11/2007  . GERD 10/11/2007  . COLONIC POLYPS, HX OF 10/11/2007    Past Surgical History:  Procedure Laterality Date  . BREAST BIOPSY    . ELBOW FRACTURE SURGERY Left   . HERNIA REPAIR     pt denies  . LAMINECTOMY  10/11/11   with fusion @ L4-5  . RETINAL TEAR REPAIR CRYOTHERAPY Right 2015  . TUBAL LIGATION       OB History   None      Home Medications    Prior to Admission medications   Medication Sig Start Date End Date Taking? Authorizing Provider  acetaminophen (TYLENOL) 500 MG tablet Take 1,000 mg by mouth daily as needed for headache.   Yes [provider]  Glucosamine HCl (GLUCOSAMINE PO) Take 1 tablet by mouth daily.   Yes [provider]  hydrochlorothiazide (HYDRODIURIL) 25 MG tablet Take 25 mg by mouth daily.  07/26/14  Yes [provider]  NEXIUM 40 MG capsule TAKE 1 CAPSULE BY MOUTH  TWICE A DAY BEFORE MEALS 07/31/17  Yes Irene Shipper, MD  Omega-3 Fatty Acids (FISH OIL PO) Take 1 tablet by mouth daily.   Yes [provider]  Polyethyl Glycol-Propyl Glycol (SYSTANE OP) Place 1 drop into both eyes daily as needed (dry eyes, allergies).   Yes [provider]  meclizine (ANTIVERT) 25 MG tablet Take 1 tablet (25 mg total) by mouth 3 (three) times daily as needed. Patient not taking: Reported on 12/05/2017 03/06/14   Marin Olp, MD  ranitidine (ZANTAC) 150 MG tablet Take 1 tablet (150 mg total) 2 (two) times daily by mouth. Patient not taking: Reported on 12/05/2017 06/22/17   Alfredia Ferguson, PA-C    Family History Family History  Problem Relation Age of Onset  . Ovarian cancer Mother   . Lung cancer Mother   . Diabetes Father   . Heart disease Father   . Leukemia  Father   . Diabetes Sister        x 2  . Rectal cancer Sister        anal  . Diabetes Brother        x 3  . Colon cancer Neg Hx   . Throat cancer Neg Hx   . Kidney disease Neg Hx   . Anesthesia problems Neg Hx   . Liver disease Neg Hx     Social History Social History   Tobacco Use  . Smoking status: Never Smoker  . Smokeless tobacco: Never Used  Substance Use Topics  . Alcohol use: Yes    Alcohol/week: 1.8 oz    Types: 3 Glasses of wine per week  . Drug use: No     Allergies   Patient has no known allergies.   Review of Systems Review of Systems  Constitutional: Negative for fever.  HENT: Negative for ear pain and sore throat.   Eyes: Negative for photophobia, pain and visual disturbance.  Respiratory: Negative for shortness of breath.   Cardiovascular: Negative for chest pain and palpitations.  Gastrointestinal: Negative for abdominal pain, nausea and vomiting.  Genitourinary: Negative for dysuria and hematuria.  Musculoskeletal: Negative for arthralgias and back pain.       Left elbow pain  Skin: Positive for wound.  Neurological: Positive for headaches. Negative for dizziness, syncope, weakness, light-headedness and numbness.       Head trauma, no loc  All other systems reviewed and are negative.    Physical Exam Updated Vital Signs BP (!) 149/91 (BP Location: Left Arm)   Pulse 69   Temp 99 F (37.2 C) (Oral)   Resp 15   Ht 5\' 7"  (1.702 m)   Wt 92.1 kg (203 lb)   SpO2 100%   BMI 31.79 kg/m   Physical Exam  Constitutional: She appears well-developed and well-nourished. No distress.  HENT:  Mouth/Throat: Oropharynx is clear and moist.  Tenderness and laceration to right parietal scalp.  No active bleeding.  No foreign body noted.  Laceration is linear and 3 m in length.  Right ear has some ecchymosis to upper auricle. No hearing difficulty.  No ttp to remainder of scalp or face. Nose normal.   Eyes: Pupils are equal, round, and reactive to light.  Conjunctivae and EOM are normal.  Neck: Normal range of motion. Neck supple.  Cardiovascular: Normal rate, regular rhythm, normal heart sounds and intact distal pulses.  No murmur heard. Pulmonary/Chest: Effort normal and breath sounds normal. No respiratory distress. She has no wheezes.  Abdominal: Soft. Bowel sounds are normal. She exhibits  no distension. There is no tenderness.  Musculoskeletal: Normal range of motion. She exhibits no deformity.  tto to left elbow, no bony tenderness or deformity to humerus, radius or ulna.  No tenderness to right shoulder. No TTP to the cervical, thoracic, or lumbar spine. No pain to the paraspinous muscles.  Neurological: She is alert.  Mental Status:  Alert, thought content appropriate, able to give a coherent history. Speech fluent without evidence of aphasia. Able to follow 2 step commands without difficulty.  Cranial Nerves:  II: pupils equal, round, reactive to light III,IV, VI: ptosis not present, extra-ocular motions intact bilaterally  V,VII: smile symmetric, facial light touch sensation equal VIII: hearing grossly normal to voice  X: uvula elevates symmetrically  XI: bilateral shoulder shrug symmetric and strong XII: midline tongue extension without fassiculations Motor:  Normal tone. 5/5 strength of BUE and BLE major muscle groups including strong and equal grip strength and dorsiflexion/plantar flexion Sensory: light touch normal in all extremities. Gait: normal gait and balance.  CV: 2+ radial and DP/PT pulses  Skin: Skin is warm and dry. Capillary refill takes less than 2 seconds.  Psychiatric: She has a normal mood and affect.  Nursing note and vitals reviewed.    ED Treatments / Results  Labs (all labs ordered are listed, but only abnormal results are displayed) Labs Reviewed - No data to display  EKG None  Radiology Dg Elbow Complete Left  Result Date: 12/05/2017 CLINICAL DATA:  Left elbow pain after fall. EXAM: LEFT  ELBOW - COMPLETE 3+ VIEW COMPARISON:  None. FINDINGS: There is no evidence of fracture, dislocation, or joint effusion. There is no evidence of arthropathy or other focal bone abnormality. Soft tissues are unremarkable. IMPRESSION: Normal left elbow. Electronically Signed   By: Marijo Conception, M.D.   On: 12/05/2017 14:19   Ct Head Wo Contrast  Result Date: 12/05/2017 CLINICAL DATA:  [address] and hit her head on a piece of metal this morning at [employer]. 1/2 inch laceration posterior scalp. EXAM: CT HEAD WITHOUT CONTRAST TECHNIQUE: Contiguous axial images were obtained from the base of the skull through the vertex without intravenous contrast. COMPARISON:  None. FINDINGS: Brain: Diffusely enlarged ventricles and subarachnoid spaces. Patchy white matter low density in both cerebral hemispheres. No intracranial hemorrhage, mass lesion or CT evidence of acute infarction. Vascular: No hyperdense vessel or unexpected calcification. Skull: Normal. Negative for fracture or focal lesion. Sinuses/Orbits: Retained secretions and minimal mucosal thickening in the left maxillary sinus and mild mucosal thickening in the sphenoid sinus on the right. Unremarkable orbits. Other: Mild right posterior scalp irregularity and small amount of underlying scalp hemorrhage. IMPRESSION: 1. Small right posterior scalp laceration without skull fracture or intracranial hemorrhage. 2. Minimal diffuse cerebral and cerebellar atrophy. 3. Minimal chronic small vessel white matter ischemic changes in both cerebral hemispheres. 4. Mild chronic sinusitis. Electronically Signed   By: Claudie Revering M.D.   On: 12/05/2017 14:34    Procedures .Marland KitchenLaceration Repair Date/Time: 12/05/2017 3:39 PM Performed by: Rodney Booze, PA-C Authorized by: Rodney Booze, PA-C   Consent:    Consent obtained:  Verbal   Consent given by:  Patient   Risks discussed:  Infection and pain   Alternatives discussed:  No treatment Anesthesia (see MAR for exact  dosages):    Anesthesia method:  Local infiltration   Local anesthetic:  Lidocaine 2% WITH epi Laceration details:    Location:  Scalp   Scalp location:  R parietal   Length (cm):  3 Repair type:    Repair type:  Simple Pre-procedure details:    Preparation:  Patient was prepped and draped in usual sterile fashion and imaging obtained to evaluate for foreign bodies Exploration:    Hemostasis achieved with:  Epinephrine   Wound exploration: wound explored through full range of motion     Contaminated: no   Treatment:    Area cleansed with:  Saline   Amount of cleaning:  Extensive   Irrigation solution:  Sterile saline   Irrigation volume:  600   Irrigation method:  Pressure wash   Visualized foreign bodies/material removed: no   Skin repair:    Repair method:  Staples   Number of staples:  6 Approximation:    Approximation:  Close Post-procedure details:    Dressing:  Open (no dressing)   Patient tolerance of procedure:  Tolerated well, no immediate complications   (including critical care time)  Medications Ordered in ED Medications  acetaminophen (TYLENOL) tablet 650 mg (650 mg Oral Given 12/05/17 1344)  lidocaine-EPINEPHrine (XYLOCAINE W/EPI) 2 %-1:100000 (with pres) injection 20 mL (20 mLs Intradermal Given 12/05/17 1523)     Initial Impression / Assessment and Plan / ED Course  I have reviewed the triage vital signs and the nursing notes.  Pertinent labs & imaging results that were available during my care of the patient were reviewed by me and considered in my medical decision making (see chart for details).  Discussed pt presentation and exam findings with Dr. Ashok Cordia, who evaluated pt and agrees with the plan to discharge with outpatient f/u.  Pt feels improved after tylenol.    Final Clinical Impressions(s) / ED Diagnoses   Final diagnoses:  Injury of head, initial encounter  Laceration of scalp without foreign body, initial encounter  Fall, initial  encounter  Left elbow pain   Patient presents after mechanical fall with head trauma.  Vital signs are stable. Not on blood thinners. Neurologic exam is within normal limits.  Also with left elbow pain and no other injuries.  No neck or back pain.  No LOC.  Head CT negative for acute intracranial abnormality.  Left elbow x-ray negative for acute fracture dislocation.   Pressure irrigation performed. Wound explored and base of wound visualized in a bloodless field without evidence of foreign body.  Imaging showed no evidence of FB. Laceration occurred < 8 hours prior to repair which was well tolerated. Tdap UTD.  Pt has no comorbidities to effect normal wound healing. Pt discharged without antibiotics.  Discussed suture home care with patient and answered questions. Pt to follow-up for wound check and suture removal in 7-10 days; they are to return to the ED sooner for signs of infection. Pt is hemodynamically stable with no complaints prior to dc. Discussed concussion sxs and reasons to f/u immediately in the ED. Pt and her husband at bedside understands plan and reasons to return immediately to the ED.  All questions answered.  ED Discharge Orders    None       Rodney Booze, PA-C 12/05/17 [place], PA-C 12/05/17 1556    Lajean Saver, MD 12/06/17 [phone]

## 2017-12-05 NOTE — ED Triage Notes (Signed)
Patient reports she got "off balance" and fell during pilates this morning hitting head on piece of metal. Approximately half inch laceration to posterior head. Bleeding controlled. Denies head injury and LOC. Denies taking blood thinners. A&Ox4.

## 2017-12-13 DIAGNOSIS — M25522 Pain in left elbow: Secondary | ICD-10-CM | POA: Diagnosis not present

## 2017-12-13 DIAGNOSIS — W010XXA Fall on same level from slipping, tripping and stumbling without subsequent striking against object, initial encounter: Secondary | ICD-10-CM | POA: Insufficient documentation

## 2017-12-13 DIAGNOSIS — S0191XA Laceration without foreign body of unspecified part of head, initial encounter: Secondary | ICD-10-CM | POA: Diagnosis not present

## 2017-12-13 DIAGNOSIS — Z4802 Encounter for removal of sutures: Secondary | ICD-10-CM | POA: Diagnosis not present

## 2017-12-13 DIAGNOSIS — W01198A Fall on same level from slipping, tripping and stumbling with subsequent striking against other object, initial encounter: Secondary | ICD-10-CM | POA: Diagnosis not present

## 2017-12-15 ENCOUNTER — Other Ambulatory Visit: Payer: Self-pay | Admitting: Internal Medicine

## 2017-12-15 DIAGNOSIS — R1032 Left lower quadrant pain: Secondary | ICD-10-CM | POA: Diagnosis not present

## 2017-12-15 DIAGNOSIS — Z6830 Body mass index (BMI) 30.0-30.9, adult: Secondary | ICD-10-CM | POA: Diagnosis not present

## 2017-12-19 ENCOUNTER — Encounter: Payer: Self-pay | Admitting: Physician Assistant

## 2017-12-19 DIAGNOSIS — R1032 Left lower quadrant pain: Secondary | ICD-10-CM | POA: Diagnosis not present

## 2017-12-20 DIAGNOSIS — R1032 Left lower quadrant pain: Secondary | ICD-10-CM | POA: Diagnosis not present

## 2017-12-20 DIAGNOSIS — K921 Melena: Secondary | ICD-10-CM | POA: Diagnosis not present

## 2017-12-20 DIAGNOSIS — Z6830 Body mass index (BMI) 30.0-30.9, adult: Secondary | ICD-10-CM | POA: Diagnosis not present

## 2017-12-21 ENCOUNTER — Ambulatory Visit: Payer: Medicare HMO | Admitting: Physician Assistant

## 2017-12-25 DIAGNOSIS — R69 Illness, unspecified: Secondary | ICD-10-CM | POA: Diagnosis not present

## 2018-01-09 ENCOUNTER — Encounter

## 2018-01-09 ENCOUNTER — Ambulatory Visit: Payer: Medicare HMO | Admitting: Physician Assistant

## 2018-01-09 DIAGNOSIS — K1329 Other disturbances of oral epithelium, including tongue: Secondary | ICD-10-CM | POA: Diagnosis not present

## 2018-01-09 DIAGNOSIS — K1370 Unspecified lesions of oral mucosa: Secondary | ICD-10-CM | POA: Diagnosis not present

## 2018-01-12 DIAGNOSIS — Z85828 Personal history of other malignant neoplasm of skin: Secondary | ICD-10-CM | POA: Diagnosis not present

## 2018-01-12 DIAGNOSIS — S80861S Insect bite (nonvenomous), right lower leg, sequela: Secondary | ICD-10-CM | POA: Diagnosis not present

## 2018-01-12 DIAGNOSIS — K1329 Other disturbances of oral epithelium, including tongue: Secondary | ICD-10-CM | POA: Diagnosis not present

## 2018-01-16 ENCOUNTER — Other Ambulatory Visit: Payer: Self-pay | Admitting: Physician Assistant

## 2018-01-25 DIAGNOSIS — K6282 Dysplasia of anus: Secondary | ICD-10-CM | POA: Diagnosis not present

## 2018-02-02 DIAGNOSIS — R51 Headache: Secondary | ICD-10-CM | POA: Diagnosis not present

## 2018-02-02 DIAGNOSIS — J020 Streptococcal pharyngitis: Secondary | ICD-10-CM | POA: Diagnosis not present

## 2018-02-02 DIAGNOSIS — J302 Other seasonal allergic rhinitis: Secondary | ICD-10-CM | POA: Insufficient documentation

## 2018-02-02 DIAGNOSIS — R519 Headache, unspecified: Secondary | ICD-10-CM | POA: Insufficient documentation

## 2018-02-02 DIAGNOSIS — J029 Acute pharyngitis, unspecified: Secondary | ICD-10-CM | POA: Diagnosis not present

## 2018-02-02 DIAGNOSIS — J309 Allergic rhinitis, unspecified: Secondary | ICD-10-CM | POA: Diagnosis not present

## 2018-02-13 DIAGNOSIS — K1370 Unspecified lesions of oral mucosa: Secondary | ICD-10-CM | POA: Diagnosis not present

## 2018-02-13 DIAGNOSIS — K1329 Other disturbances of oral epithelium, including tongue: Secondary | ICD-10-CM | POA: Diagnosis not present

## 2018-02-14 DIAGNOSIS — Z85828 Personal history of other malignant neoplasm of skin: Secondary | ICD-10-CM | POA: Diagnosis not present

## 2018-02-14 DIAGNOSIS — L271 Localized skin eruption due to drugs and medicaments taken internally: Secondary | ICD-10-CM | POA: Diagnosis not present

## 2018-02-21 DIAGNOSIS — K136 Irritative hyperplasia of oral mucosa: Secondary | ICD-10-CM | POA: Diagnosis not present

## 2018-02-28 DIAGNOSIS — L989 Disorder of the skin and subcutaneous tissue, unspecified: Secondary | ICD-10-CM | POA: Diagnosis not present

## 2018-02-28 DIAGNOSIS — Z6830 Body mass index (BMI) 30.0-30.9, adult: Secondary | ICD-10-CM | POA: Diagnosis not present

## 2018-02-28 DIAGNOSIS — K121 Other forms of stomatitis: Secondary | ICD-10-CM | POA: Insufficient documentation

## 2018-03-01 DIAGNOSIS — Z85828 Personal history of other malignant neoplasm of skin: Secondary | ICD-10-CM | POA: Diagnosis not present

## 2018-03-01 DIAGNOSIS — D1801 Hemangioma of skin and subcutaneous tissue: Secondary | ICD-10-CM | POA: Diagnosis not present

## 2018-03-01 DIAGNOSIS — L810 Postinflammatory hyperpigmentation: Secondary | ICD-10-CM | POA: Diagnosis not present

## 2018-03-01 DIAGNOSIS — L814 Other melanin hyperpigmentation: Secondary | ICD-10-CM | POA: Diagnosis not present

## 2018-03-01 DIAGNOSIS — L821 Other seborrheic keratosis: Secondary | ICD-10-CM | POA: Diagnosis not present

## 2018-03-01 DIAGNOSIS — D2239 Melanocytic nevi of other parts of face: Secondary | ICD-10-CM | POA: Diagnosis not present

## 2018-03-01 DIAGNOSIS — L812 Freckles: Secondary | ICD-10-CM | POA: Diagnosis not present

## 2018-03-26 DIAGNOSIS — Z6830 Body mass index (BMI) 30.0-30.9, adult: Secondary | ICD-10-CM | POA: Diagnosis not present

## 2018-03-26 DIAGNOSIS — D8989 Other specified disorders involving the immune mechanism, not elsewhere classified: Secondary | ICD-10-CM | POA: Diagnosis not present

## 2018-03-26 DIAGNOSIS — E669 Obesity, unspecified: Secondary | ICD-10-CM | POA: Diagnosis not present

## 2018-03-26 DIAGNOSIS — H6123 Impacted cerumen, bilateral: Secondary | ICD-10-CM | POA: Diagnosis not present

## 2018-03-26 DIAGNOSIS — Z8719 Personal history of other diseases of the digestive system: Secondary | ICD-10-CM | POA: Diagnosis not present

## 2018-04-20 DIAGNOSIS — M722 Plantar fascial fibromatosis: Secondary | ICD-10-CM | POA: Diagnosis not present

## 2018-04-26 DIAGNOSIS — I10 Essential (primary) hypertension: Secondary | ICD-10-CM | POA: Diagnosis not present

## 2018-04-26 DIAGNOSIS — M542 Cervicalgia: Secondary | ICD-10-CM | POA: Diagnosis not present

## 2018-04-26 DIAGNOSIS — Z6830 Body mass index (BMI) 30.0-30.9, adult: Secondary | ICD-10-CM | POA: Diagnosis not present

## 2018-05-09 DIAGNOSIS — M542 Cervicalgia: Secondary | ICD-10-CM | POA: Diagnosis not present

## 2018-05-17 DIAGNOSIS — M542 Cervicalgia: Secondary | ICD-10-CM | POA: Diagnosis not present

## 2018-05-21 DIAGNOSIS — S8002XA Contusion of left knee, initial encounter: Secondary | ICD-10-CM | POA: Diagnosis not present

## 2018-05-30 DIAGNOSIS — M542 Cervicalgia: Secondary | ICD-10-CM | POA: Diagnosis not present

## 2018-05-31 DIAGNOSIS — M722 Plantar fascial fibromatosis: Secondary | ICD-10-CM | POA: Insufficient documentation

## 2018-06-07 DIAGNOSIS — M542 Cervicalgia: Secondary | ICD-10-CM | POA: Diagnosis not present

## 2018-06-18 DIAGNOSIS — M542 Cervicalgia: Secondary | ICD-10-CM | POA: Diagnosis not present

## 2018-06-20 ENCOUNTER — Other Ambulatory Visit: Payer: Self-pay | Admitting: Obstetrics and Gynecology

## 2018-06-20 DIAGNOSIS — Z1231 Encounter for screening mammogram for malignant neoplasm of breast: Secondary | ICD-10-CM

## 2018-07-12 DIAGNOSIS — M25561 Pain in right knee: Secondary | ICD-10-CM | POA: Diagnosis not present

## 2018-07-12 DIAGNOSIS — M238X1 Other internal derangements of right knee: Secondary | ICD-10-CM | POA: Diagnosis not present

## 2018-08-03 DIAGNOSIS — R82998 Other abnormal findings in urine: Secondary | ICD-10-CM | POA: Diagnosis not present

## 2018-08-03 DIAGNOSIS — E7849 Other hyperlipidemia: Secondary | ICD-10-CM | POA: Diagnosis not present

## 2018-08-03 DIAGNOSIS — I10 Essential (primary) hypertension: Secondary | ICD-10-CM | POA: Diagnosis not present

## 2018-08-06 ENCOUNTER — Ambulatory Visit
Admission: RE | Admit: 2018-08-06 | Discharge: 2018-08-06 | Disposition: A | Discharge status: Home / Self Care | Payer: Medicare HMO | Source: Ambulatory Visit | Attending: Obstetrics and Gynecology | Admitting: Obstetrics and Gynecology

## 2018-08-06 DIAGNOSIS — Z1231 Encounter for screening mammogram for malignant neoplasm of breast: Secondary | ICD-10-CM

## 2018-08-06 IMAGING — MG DIGITAL SCREENING BILATERAL MAMMOGRAM WITH TOMO AND CAD
8 series · 8 of 24 positions shown · non-contrast
Comparison: Previous exam(s).

CLINICAL DATA: Screening.

EXAM:
DIGITAL SCREENING BILATERAL MAMMOGRAM WITH TOMO AND CAD

[L MLO synth-2D]
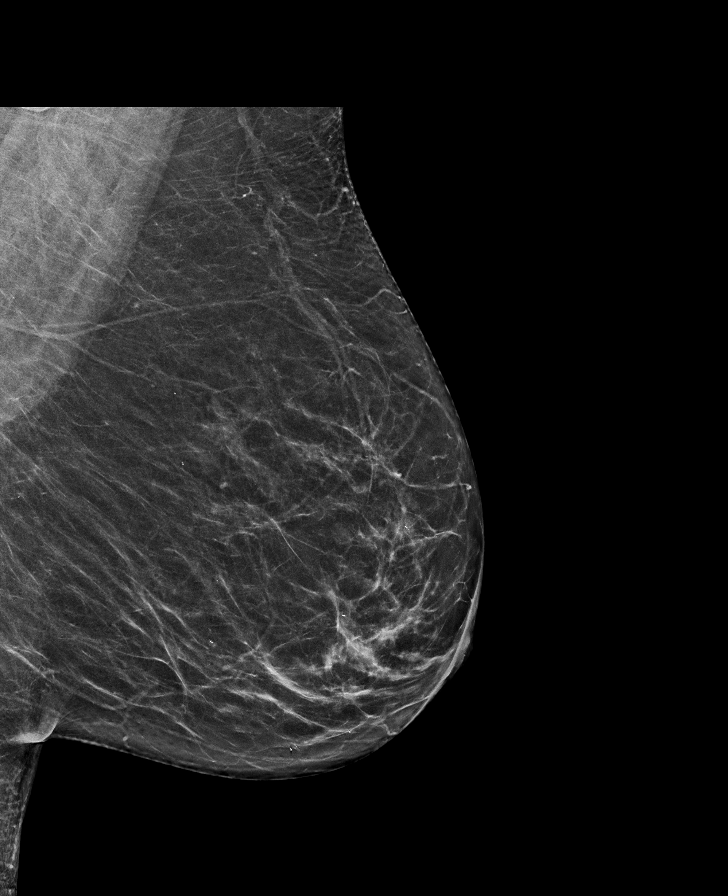

[L CC synth-2D]
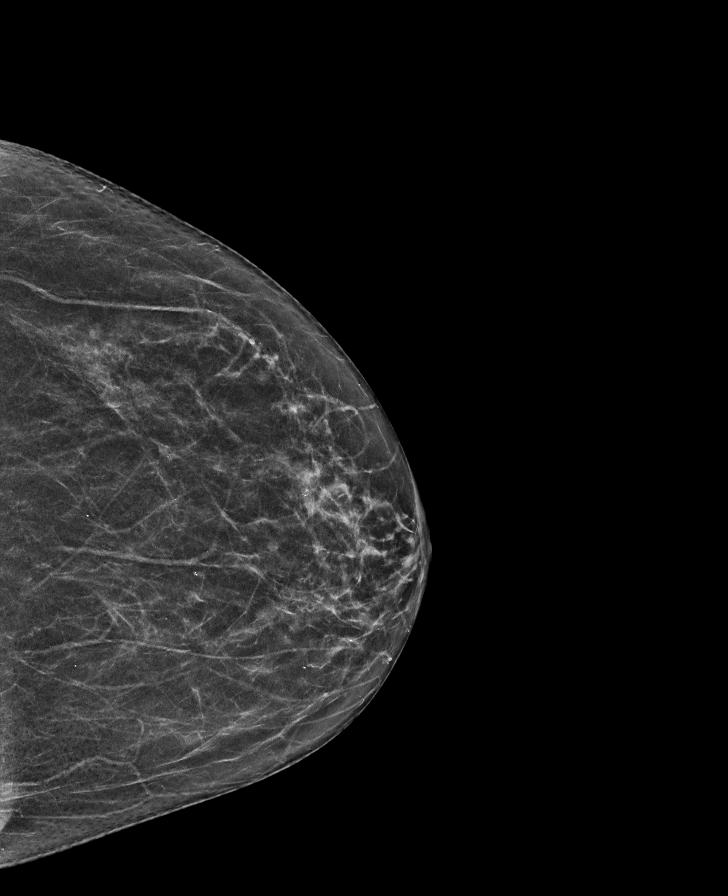

[R CC synth-2D]
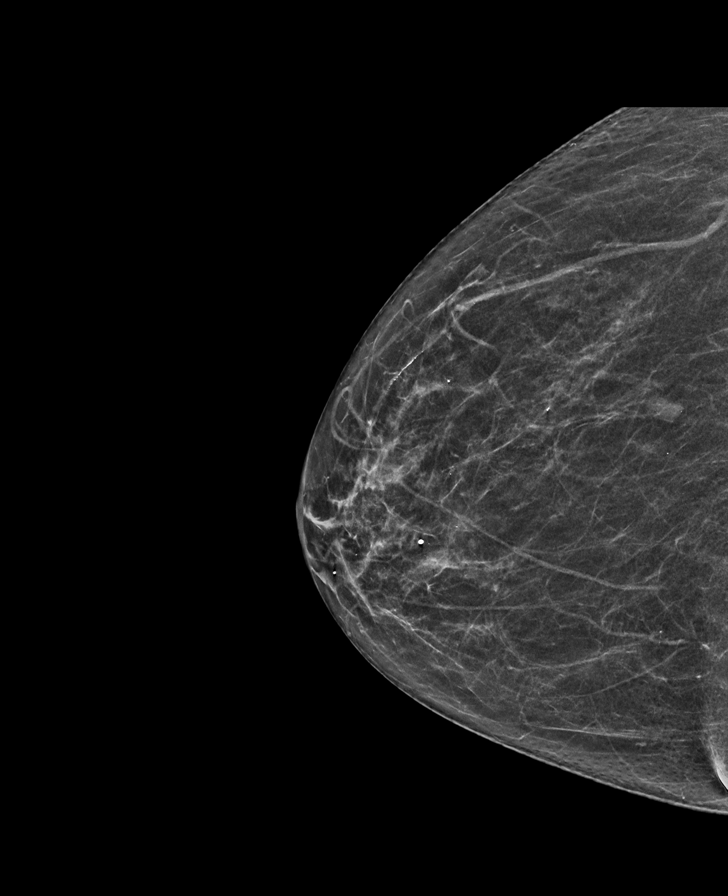

[R MLO synth-2D]
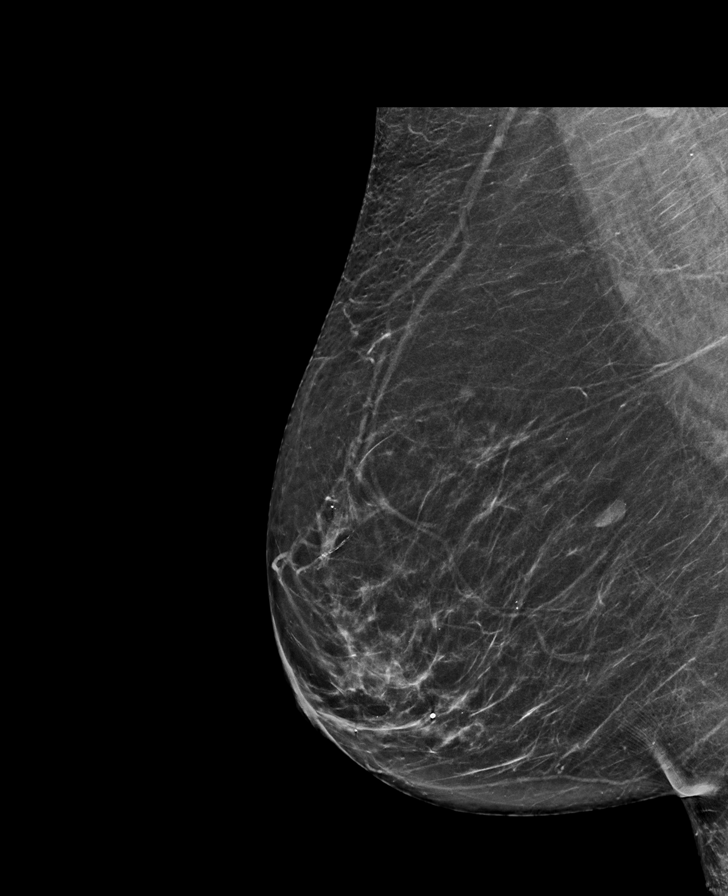

[L CC tomo · tomo slice 31/60.0]
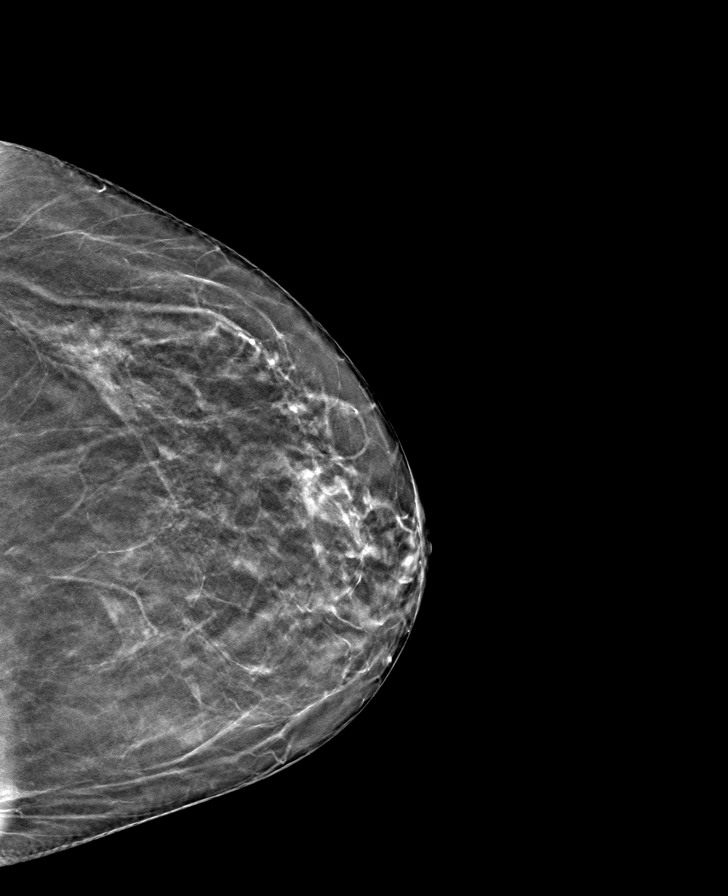

[R MLO tomo · tomo slice 36/71.0]
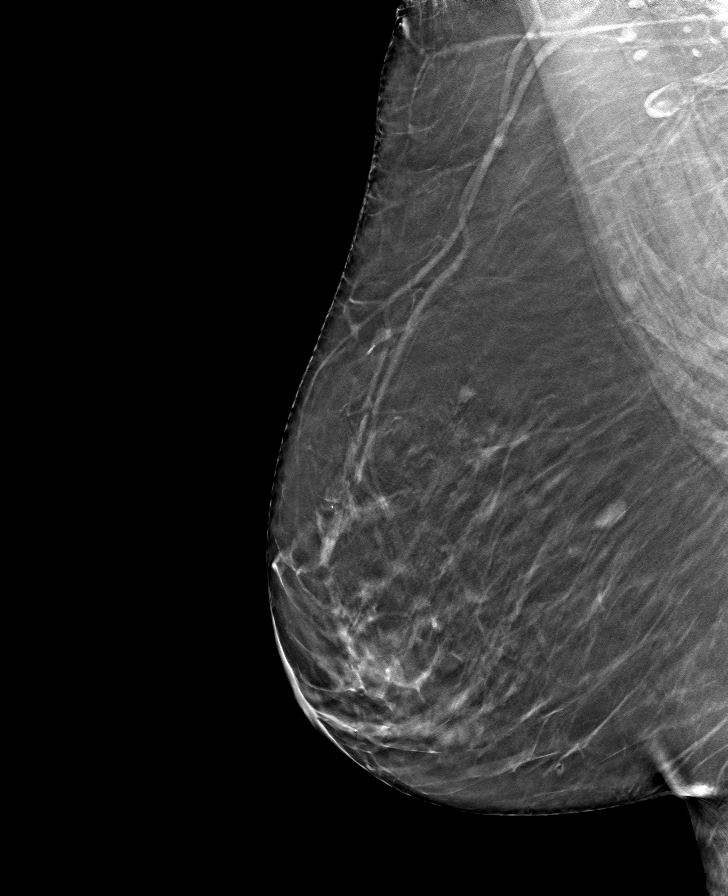

[R CC tomo · tomo slice 29/58.0]
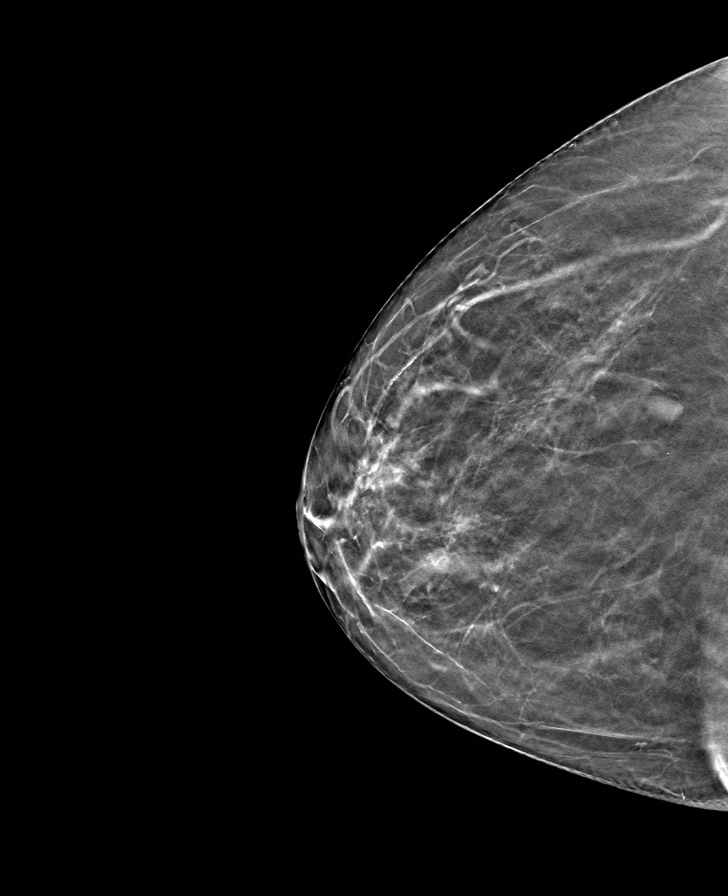

[L MLO tomo · tomo slice 37/72.0]
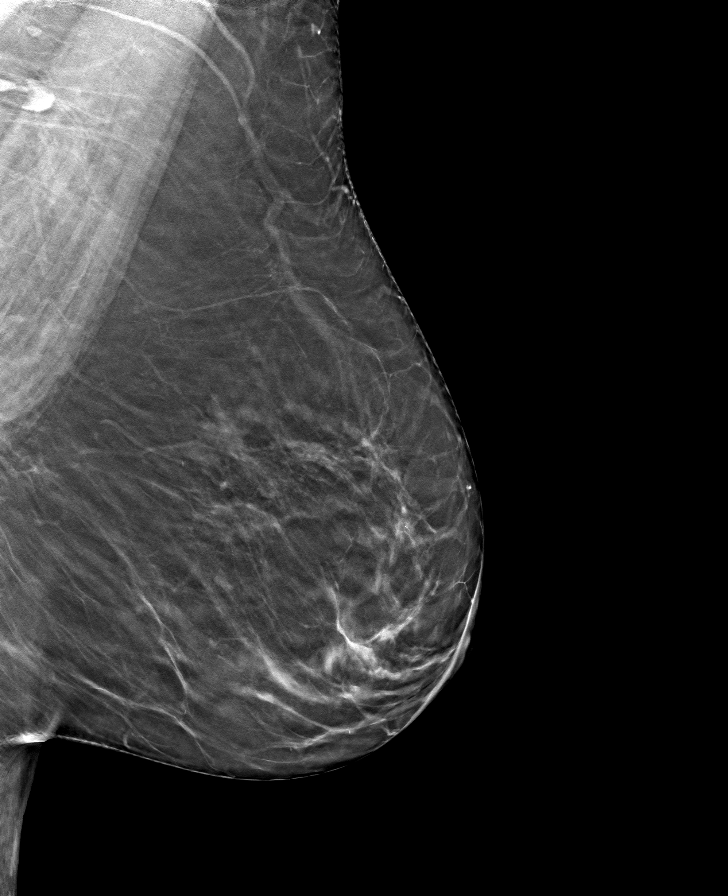

[8 of 24 positions shown; findings below may reference images not displayed]

ACR Breast Density Category b: There are scattered areas of
fibroglandular density.
FINDINGS: There are no findings suspicious for malignancy. Images were
processed with CAD.
IMPRESSION: No mammographic evidence of malignancy. A result letter of this
screening mammogram will be mailed directly to the patient.

RECOMMENDATION:
Screening mammogram in one year. (Code:[TQ])

BI-RADS CATEGORY  1: Negative.

## 2018-08-07 ENCOUNTER — Other Ambulatory Visit: Payer: Self-pay | Admitting: Internal Medicine

## 2018-08-07 DIAGNOSIS — Z8249 Family history of ischemic heart disease and other diseases of the circulatory system: Secondary | ICD-10-CM | POA: Diagnosis not present

## 2018-08-07 DIAGNOSIS — Z6830 Body mass index (BMI) 30.0-30.9, adult: Secondary | ICD-10-CM | POA: Diagnosis not present

## 2018-08-07 DIAGNOSIS — Z23 Encounter for immunization: Secondary | ICD-10-CM | POA: Diagnosis not present

## 2018-08-07 DIAGNOSIS — K219 Gastro-esophageal reflux disease without esophagitis: Secondary | ICD-10-CM | POA: Diagnosis not present

## 2018-08-07 DIAGNOSIS — Z0000 Encounter for general adult medical examination without abnormal findings: Secondary | ICD-10-CM

## 2018-08-07 DIAGNOSIS — E7849 Other hyperlipidemia: Secondary | ICD-10-CM | POA: Diagnosis not present

## 2018-08-07 DIAGNOSIS — K121 Other forms of stomatitis: Secondary | ICD-10-CM | POA: Diagnosis not present

## 2018-08-07 DIAGNOSIS — I10 Essential (primary) hypertension: Secondary | ICD-10-CM | POA: Diagnosis not present

## 2018-08-07 DIAGNOSIS — Z1389 Encounter for screening for other disorder: Secondary | ICD-10-CM | POA: Diagnosis not present

## 2018-08-07 DIAGNOSIS — E785 Hyperlipidemia, unspecified: Secondary | ICD-10-CM

## 2018-08-07 DIAGNOSIS — E668 Other obesity: Secondary | ICD-10-CM | POA: Diagnosis not present

## 2018-08-09 ENCOUNTER — Ambulatory Visit
Admission: RE | Admit: 2018-08-09 | Discharge: 2018-08-09 | Disposition: A | Discharge status: Home / Self Care | Payer: No Typology Code available for payment source | Source: Ambulatory Visit | Attending: Internal Medicine | Admitting: Internal Medicine

## 2018-08-09 DIAGNOSIS — Z0000 Encounter for general adult medical examination without abnormal findings: Secondary | ICD-10-CM

## 2018-08-09 DIAGNOSIS — E785 Hyperlipidemia, unspecified: Secondary | ICD-10-CM

## 2018-08-09 DIAGNOSIS — Z8249 Family history of ischemic heart disease and other diseases of the circulatory system: Secondary | ICD-10-CM

## 2018-08-09 IMAGING — CT CT HEART SCORING
3 series · 14 of 20 positions shown, 16 images · non-contrast
Comparison: None.

CLINICAL DATA: 67-year-old white female with hyperlipidemia. Family
history of ischemic heart disease.

EXAM:
CT HEART FOR CALCIUM SCORING
TECHNIQUE: CT heart was performed on a 64 channel system using prospective ECG
gating.
A non-contrast exam for calcium scoring was performed.
Note that this exam targets the heart and the chest was not imaged
in its entirety.

[Series 2: calcium scoring 2.00 qr36 bestdiast 70% · axial · 0.41mm/px · z∈[+1609,+1717]mm · 4 of 90 slices shown]
[im 18/90  vessel]
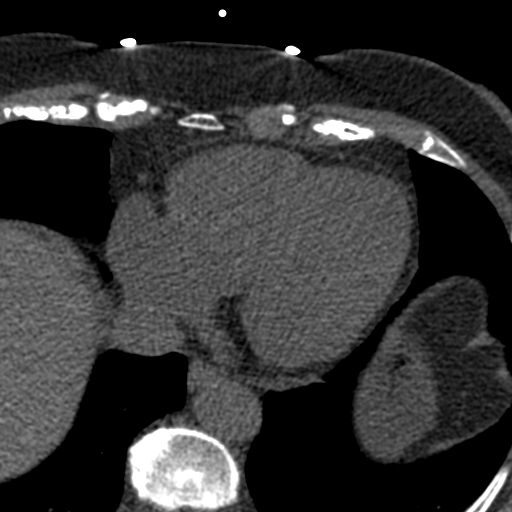
[im 36/90  vessel]
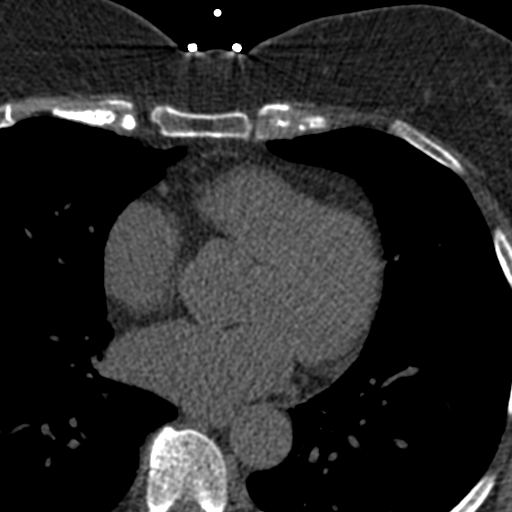
[im 54/90  vessel]
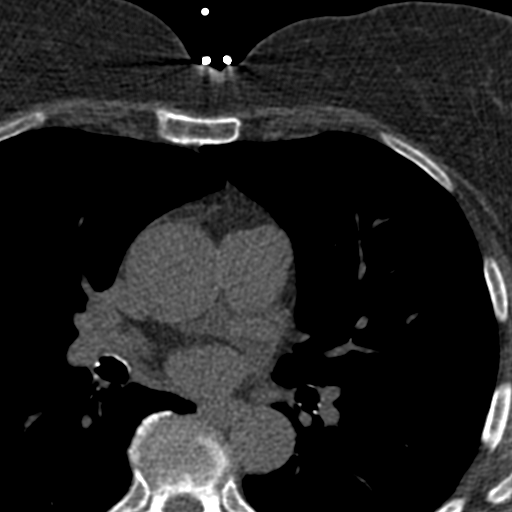
[im 72/90  vessel]
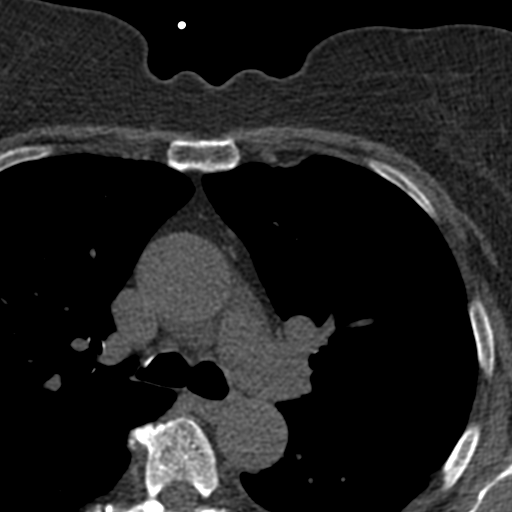

[Series 3: calcium scoring 2.00 br40 bestdiast 70% ax fov · axial · 0.60mm/px · z∈[+1603,+1723]mm · 5 of 90 slices shown, 7 images]
[im 15/90  vessel]
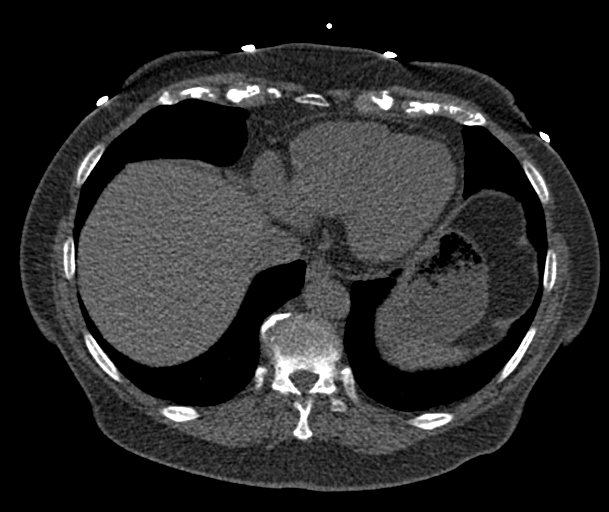
[im 15/90  lung]
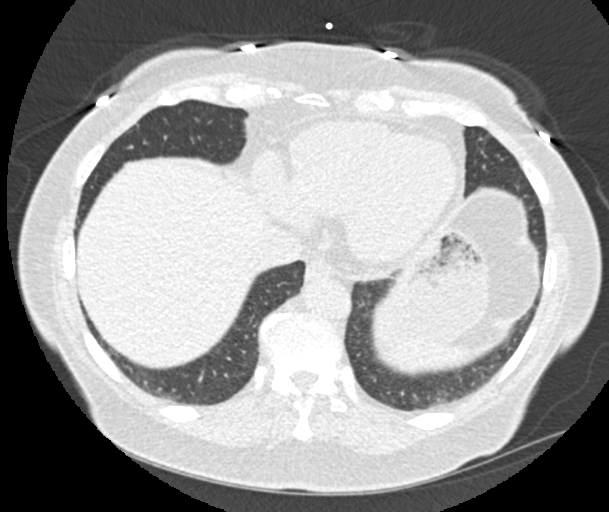
[im 30/90  vessel]
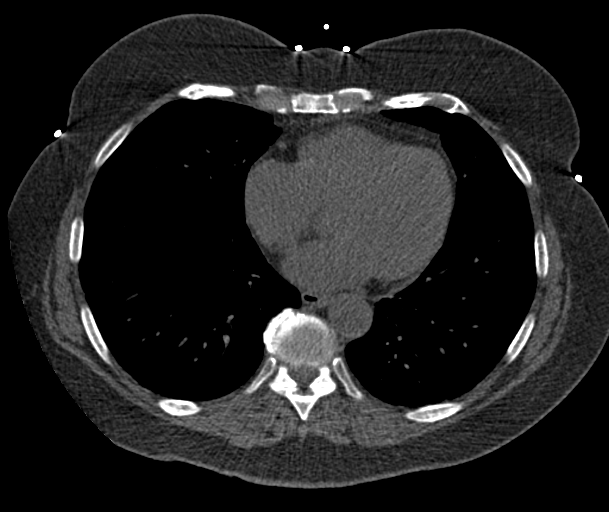
[im 45/90  vessel]
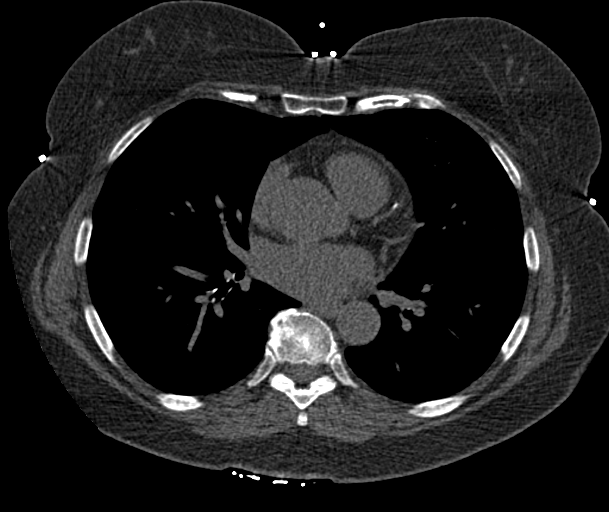
[im 60/90  vessel]
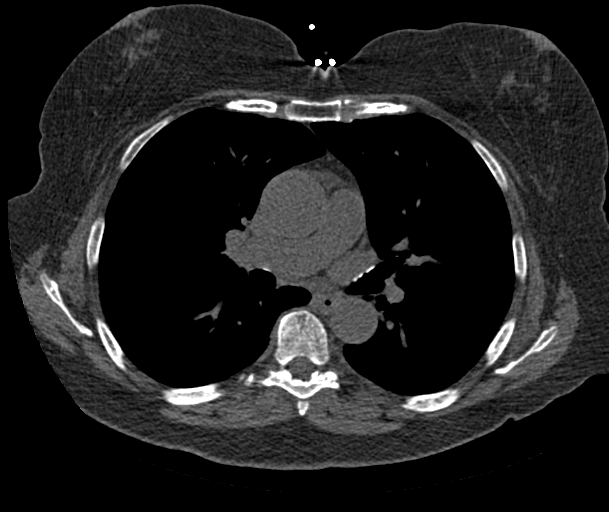
[im 75/90  vessel]
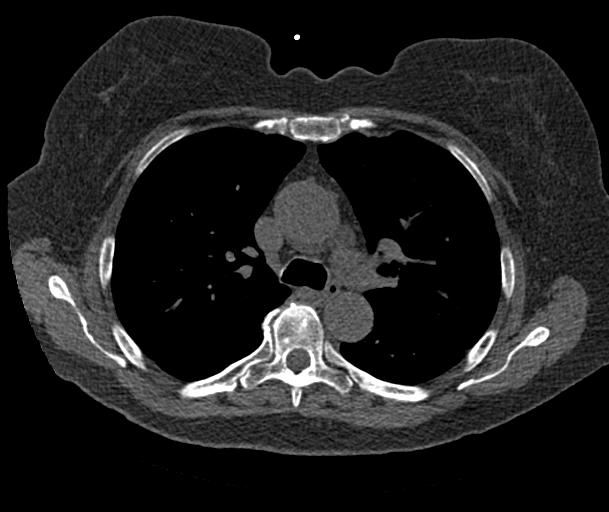
[im 75/90  lung]
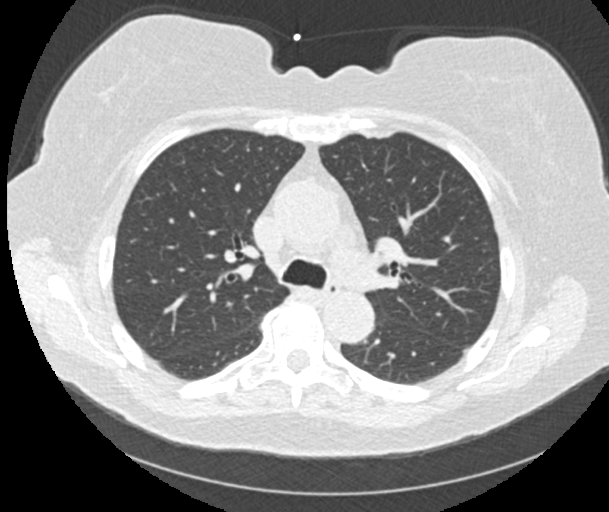

[Series 9: calcium scoring 2.00 br60 bestdiast 70% ax fov · axial · 0.60mm/px · z∈[+1603,+1723]mm · 5 of 90 slices shown]
[im 15/90  vessel]
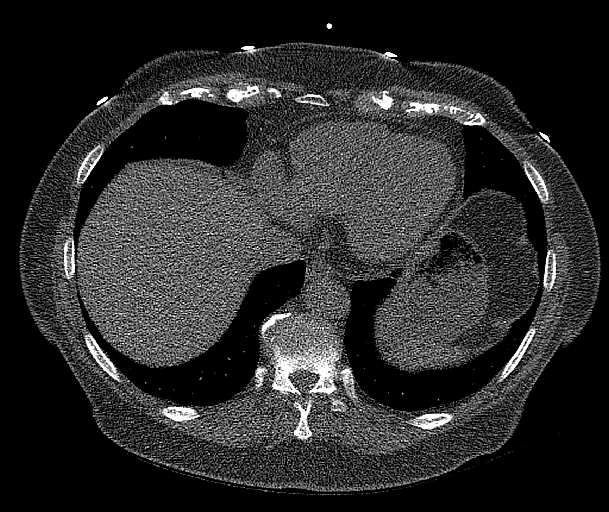
[im 30/90  vessel]
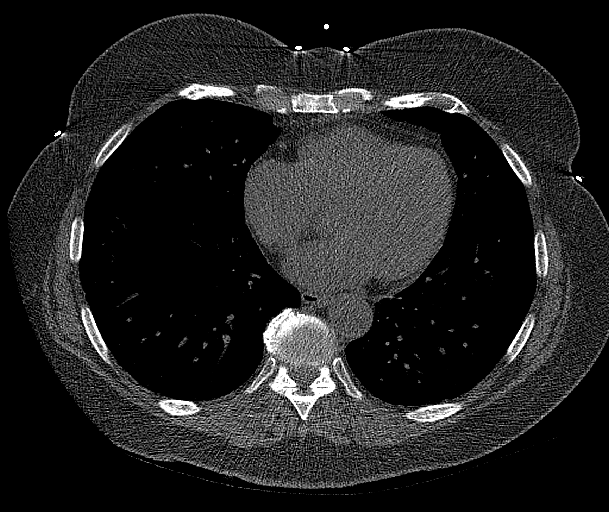
[im 45/90  vessel]
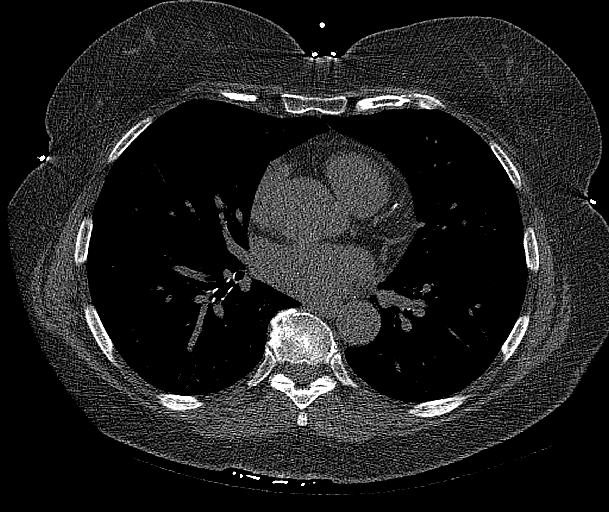
[im 60/90  vessel]
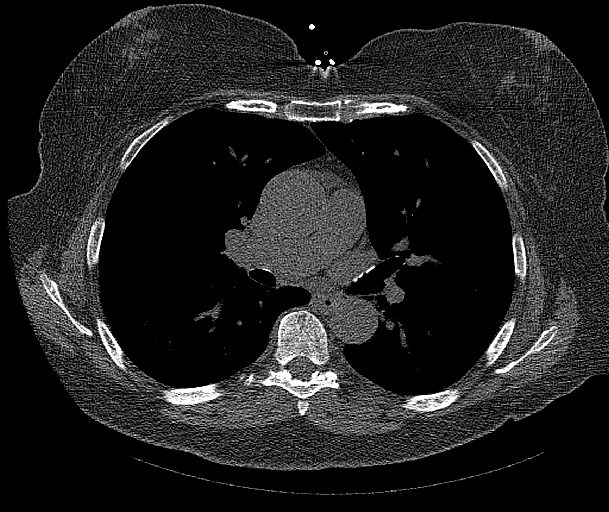
[im 75/90  vessel]
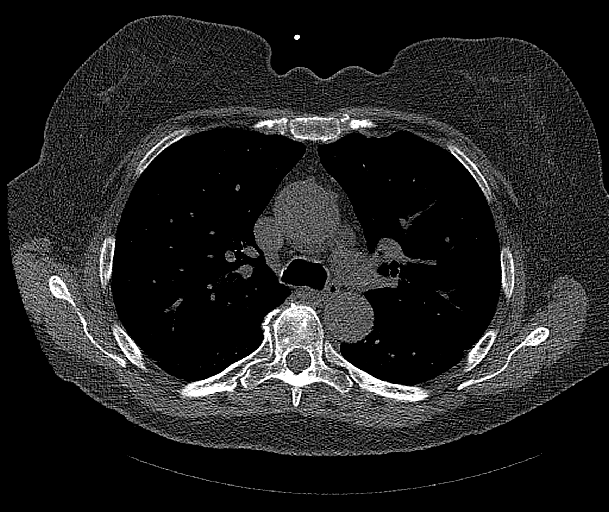

[14 of 20 positions shown; findings below may reference images not displayed]

FINDINGS: Technical quality: Good.

CORONARY CALCIUM

Total Agatston Score:

[HOSPITAL] percentile:  66

OTHER FINDINGS:

Cardiovascular: Coronary artery calcifications involving the left
main coronary artery, LAD and a diagonal branch. In addition, there
are atherosclerotic calcifications in the thoracic aorta. Ascending
thoracic aorta is mildly aneurysmal measuring up to 4.1 cm. Heart
size is normal without significant pericardial fluid. Normal caliber
of the main pulmonary arteries.

Mediastinum/Nodes: Mediastinal structures are unremarkable without
lymph node enlargement.

Lungs/Pleura: No large pleural effusions. Visualized lungs are
clear.

Upper Abdomen: Images of the upper abdomen unremarkable.

Musculoskeletal: Visualized bone structures are unremarkable.
IMPRESSION: 1. Coronary artery calcium score is 51.9. This calcium score is at
percentile 66 for subjects of the same age, gender and ethnicity.
2. Aortic aneurysm NOS ([T1]-[T1]). Ascending thoracic aorta is
aneurysmal measuring up to 4.1 cm. Recommend annual imaging followup
by CTA or MRA. This recommendation follows [T1]
ACCF/AHA/AATS/ACR/ASA/SCA/ALVERIO/ALVERIO/ALVERIO/ALVERIO Guidelines for the
Diagnosis and Management of Patients with Thoracic Aortic Disease.
Circulation. [T1]; 121: [id number].
3.  Aortic Atherosclerosis ([T1]-[T1]).

## 2018-08-14 DIAGNOSIS — I2510 Atherosclerotic heart disease of native coronary artery without angina pectoris: Secondary | ICD-10-CM | POA: Insufficient documentation

## 2018-08-14 DIAGNOSIS — I712 Thoracic aortic aneurysm, without rupture, unspecified: Secondary | ICD-10-CM | POA: Insufficient documentation

## 2018-08-15 ENCOUNTER — Telehealth: Payer: Self-pay | Admitting: Internal Medicine

## 2018-08-15 DIAGNOSIS — I712 Thoracic aortic aneurysm, without rupture, unspecified: Secondary | ICD-10-CM

## 2018-08-15 DIAGNOSIS — E785 Hyperlipidemia, unspecified: Secondary | ICD-10-CM

## 2018-08-15 DIAGNOSIS — Z01812 Encounter for preprocedural laboratory examination: Secondary | ICD-10-CM

## 2018-08-15 NOTE — Telephone Encounter (Signed)
Left message for patient to call back. Pt was seen in 2017 by Dr. Harrington Challenger. Had calcium score ct on 08/09/2018.  No current lipids on file. Does not appear to be on statin.  Will route to Dr. Harrington Challenger for review/recommendations.

## 2018-08-15 NOTE — Telephone Encounter (Signed)
New Message    Patient wants to go over CT results, and see what her next step should be since Dr. Harrington Challenger has no available appts.

## 2018-08-15 NOTE — Telephone Encounter (Signed)
I saw the pt in Jan 2017   I recomm calcium score CT   She was going to reflect on  Dr Ardeth Perfect ordered current study which shows Calcium score of 61   Evid of some mild plaquing Also mild dilation of aorta (41 mm)  I would recomm Crestor 10 mg with f/u lipids in 8 wks with AST   Goal for LDL 70 F/U CT chest in 1 year to eval aorta Will forward to Dr Ardeth Perfect for f/u    Can see in future if problesm

## 2018-08-16 ENCOUNTER — Telehealth: Payer: Self-pay | Admitting: Internal Medicine

## 2018-08-16 DIAGNOSIS — Z1212 Encounter for screening for malignant neoplasm of rectum: Secondary | ICD-10-CM | POA: Diagnosis not present

## 2018-08-16 MED ORDER — ROSUVASTATIN CALCIUM 10 MG PO TABS: 10.0000 mg | ORAL_TABLET | Freq: Every day | ORAL | 3 refills | Status: DC

## 2018-08-16 NOTE — Telephone Encounter (Signed)
See previous note

## 2018-08-16 NOTE — Telephone Encounter (Signed)
Patient states she is returning call from [facility] regarding an appt with Dr Harrington Challenger. Patient states that Caren Hazy was trying to get her an appt sooner than what is available.

## 2018-08-16 NOTE — Telephone Encounter (Signed)
Called patient see telephone encounter from 08/15/18.

## 2018-08-16 NOTE — Telephone Encounter (Signed)
Returned call to patient this morning. Reviewed calcium score CT results with the patient ordered by Dr. Ardeth Perfect. Made her aware that Dr. Harrington Challenger has reviewed the test and recommends that she start crestor 10 mg QD with fasting lipids and AST in 8 weeks. Patient's last LDL on 08/03/18 was 138 in [place]. Made patient aware that her LDL goal would be <70. Also reviewed other modifiable risk factors and interventions with the patient. Patient verbalized understanding and was in agreement. Rx sent to preferred pharmacy and Fasting Lab appointment made on 10/10/18.   Also made patient aware that Dr. Harrington Challenger recommended repeat Chest CT in 1 year to evaluate her aorta. Explained to the patient the importance of good BP control. Repeat Study ordered with BMET to be done prior.  Made patient aware that the information has been forwarded to Dr. Ardeth Perfect for follow-up and Dr. Harrington Challenger can see her in the future if any problems arise. Patient denies having any Sx other than pain in her leg at night sometimes when she is sleeping. Patient is wanting to know if she could have follow up with Dr. Harrington Challenger as well just to discuss everything. Made patient aware that I would forward to Dr. Harrington Challenger' RN for review and she will reach out to her when she is back in the office.

## 2018-08-17 DIAGNOSIS — Z124 Encounter for screening for malignant neoplasm of cervix: Secondary | ICD-10-CM | POA: Diagnosis not present

## 2018-08-17 DIAGNOSIS — Z01419 Encounter for gynecological examination (general) (routine) without abnormal findings: Secondary | ICD-10-CM | POA: Diagnosis not present

## 2018-08-17 NOTE — Telephone Encounter (Signed)
Called patient re her request for f/u with Dr. Harrington Challenger. Scheduled her on 10/12/18 (after her lipid check on Crestor).  She is concerned about the calcium score results and aneurysm. She said her mother had one in her abdomen.  No one else in her family that she knows of has had one. Reports BP is always good 120s/70s.  Hopes to get re scanned before a whole year passes.  Multiple concerns.  If possible would like to hear from Dr. Harrington Challenger directly re: these new findings, before her appointment in March.

## 2018-08-18 NOTE — Telephone Encounter (Signed)
Out of town this week at meeting  1.  Plaquing on vessels is very very mild   Do not want it to progress however 2   Aorta is MINIMALLY dilated   Could stay like this for years   (normal 39 mm)  Hers 41 mm     With both findings recomm statin

## 2018-08-22 ENCOUNTER — Telehealth: Payer: Self-pay | Admitting: Internal Medicine

## 2018-08-22 DIAGNOSIS — R69 Illness, unspecified: Secondary | ICD-10-CM | POA: Diagnosis not present

## 2018-08-22 NOTE — Telephone Encounter (Signed)
Left message for patient to call back  

## 2018-08-22 NOTE — Telephone Encounter (Signed)
New Message   Returning your phone call, and having some extra test results faxed over. Available until 2pm and after 3pm

## 2018-08-22 NOTE — Telephone Encounter (Signed)
Received reports of chest ct without contrast from 04/17/12 and 10/05/11. They were done for lung nodules. Nothing noted in report re: heart/aorta. Will have scanned into patient's chart.

## 2018-08-22 NOTE — Telephone Encounter (Signed)
Spoke with patient. See telephone note 08/15/18.

## 2018-08-22 NOTE — Telephone Encounter (Signed)
Said she had a heart ct in 2013 and she called to have the document faxed to Dr. Harrington Challenger to be reviewed and scanned into her chart.  I explained that her aorta is dilated minimally and that the plaque is mild.  She has started taking Crestor.  Reviewed lab appointment date.  She will keep the scheduled f/u with Dr. Harrington Challenger on 10/12/18.   Pt appreciative for the call/information provided.

## 2018-08-23 DIAGNOSIS — K6282 Dysplasia of anus: Secondary | ICD-10-CM | POA: Diagnosis not present

## 2018-09-25 ENCOUNTER — Encounter: Payer: Self-pay | Admitting: Internal Medicine

## 2018-10-01 DIAGNOSIS — R69 Illness, unspecified: Secondary | ICD-10-CM | POA: Diagnosis not present

## 2018-10-10 ENCOUNTER — Other Ambulatory Visit: Payer: Medicare HMO | Admitting: *Deleted

## 2018-10-10 DIAGNOSIS — E785 Hyperlipidemia, unspecified: Secondary | ICD-10-CM | POA: Diagnosis not present

## 2018-10-10 LAB — LIPID PANEL
CHOLESTEROL TOTAL: 175 mg/dL (ref 100–199)
Chol/HDL Ratio: 2.9 ratio (ref 0.0–4.4)
HDL: 61 mg/dL (ref >39)
LDL Calculated: 95 mg/dL (ref 0–99)
Triglycerides: 97 mg/dL (ref 0–149)
VLDL Cholesterol Cal: 19 mg/dL (ref 5–40)

## 2018-10-10 LAB — AST: AST: 29 IU/L (ref 0–40)

## 2018-10-12 ENCOUNTER — Encounter: Payer: Self-pay | Admitting: Internal Medicine

## 2018-10-12 ENCOUNTER — Ambulatory Visit: Payer: Medicare HMO | Admitting: Internal Medicine

## 2018-10-12 VITALS — BP 122/82 | HR 53 | Ht 67.0 in | Wt 200.1 lb

## 2018-10-12 DIAGNOSIS — R072 Precordial pain: Secondary | ICD-10-CM | POA: Diagnosis not present

## 2018-10-12 DIAGNOSIS — I10 Essential (primary) hypertension: Secondary | ICD-10-CM | POA: Diagnosis not present

## 2018-10-12 DIAGNOSIS — E785 Hyperlipidemia, unspecified: Secondary | ICD-10-CM

## 2018-10-12 DIAGNOSIS — I2510 Atherosclerotic heart disease of native coronary artery without angina pectoris: Secondary | ICD-10-CM | POA: Diagnosis not present

## 2018-10-12 NOTE — Patient Instructions (Signed)
Medication Instructions:  No changes  - see below If you need a refill on your cardiac medications before your next appointment, please call your pharmacy.   Lab work:  If you have labs (blood work) drawn today and your tests are completely normal, you will receive your results only by: Marland Kitchen MyChart Message (if you have MyChart) OR . A paper copy in the mail If you have any lab test that is abnormal or we need to change your treatment, we will call you to review the results.  Testing/Procedures: none  Follow-Up: At [facility], you and your health needs are our priority.  As part of our continuing mission to provide you with exceptional heart care, we have created designated Provider Care Teams.  These Care Teams include your primary Cardiologist (physician) and Advanced Practice Providers (APPs -  Physician Assistants and Nurse Practitioners) who all work together to provide you with the care you need, when you need it. You will need a follow up appointment in:  12 months.  Please call our office 2 months in advance to schedule this appointment.  You may see Dr. Harrington Challenger or one of the following Advanced Practice Providers on your designated Care Team: Richardson Dopp, PA-C [place], [place] . Daune Perch, NP  Any Other Special Instructions Will Be Listed Below (If Applicable). Hold Crestor for about 2 weeks to see if achiness improves.  If no change, restart Crestor at 20 mg daily and have lipids checked in 2 months.  Call office after the 2 weeks are up to update Dr. Harrington Challenger.

## 2018-10-12 NOTE — Progress Notes (Signed)
Cardiology Office Note   Date:  10/12/2018   ID:  Cynthia Padilla, DOB 14-Jun-1951, MRN [id number]  PCP:  Velna Hatchet, MD  Cardiologist:   Dorris Carnes, MD    Pt presents for f/u of CAD and HL    History of Present Illness: Cynthia Padilla is a 68 y.o. female with a history of chest pain   I saw her in 2017  Pain was atypical for coronary ischemia     When I  Saw her givin her strong FHx of CAD and her own history of HL I recomm she have a calcium score done [facility] finally had this done per Dr Hoover Brunette recommendation   This was don on 08/09/18   Ca score was 51.9   The aorta was noted to be 41 mm  Atherosclerosis noted in aorta  THe pt denies CP   Breathing is OK   Active    SHe has problems with myalgias    Has been started on Crestor Questoins if her aches are worse    Current Outpatient Medications  Medication Sig Dispense Refill  . acetaminophen (TYLENOL) 500 MG tablet Take 1,000 mg by mouth daily as needed for headache.    . Glucosamine HCl (GLUCOSAMINE PO) Take 1 tablet by mouth daily.    . hydrochlorothiazide (HYDRODIURIL) 25 MG tablet Take 25 mg by mouth daily.     . hyoscyamine (LEVSIN SL) 0.125 MG SL tablet TAKE 1 TO 2 TABLETS SUBLINGUALLY EVERY 4 TO 6 HOURS PFN 60 tablet 1  . meclizine (ANTIVERT) 25 MG tablet Take 1 tablet (25 mg total) by mouth 3 (three) times daily as needed. 30 tablet 0  . Omega-3 Fatty Acids (FISH OIL PO) Take 1 tablet by mouth daily.    Vladimir Faster Glycol-Propyl Glycol (SYSTANE OP) Place 1 drop into both eyes daily as needed (dry eyes, allergies).    . ranitidine (ZANTAC) 150 MG tablet Take 150 mg by mouth as needed for heartburn.    . rosuvastatin (CRESTOR) 10 MG tablet Take 1 tablet (10 mg total) by mouth daily. 90 tablet 3   Current Facility-Administered Medications  Medication Dose Route Frequency Provider Last Rate Last Dose  . 0.9 %  sodium chloride infusion  500 mL Intravenous Continuous Irene Shipper, MD        Allergies:   Patient has no  known allergies.   Past Medical History:  Diagnosis Date  . Anxiety   . Arthritis    thinks she has some in her spine, bursitis in hips  . Asthma    had it once when she had episode of bronchitis about 10 yrs ago  . Diverticulitis   . DIVERTICULOSIS, COLON 11/04/2004   Qualifier: Diagnosis of  By: Hardin Negus CMA (AAMA), Colletta [place]    . GERD (gastroesophageal reflux disease)   . Hepatitis    thinks she had Hepatits as a child  . History of colonic polyps   . Hyperlipidemia   . Hypertension   . Internal hemorrhoids   . PONV (postoperative nausea and vomiting)    states she gets deathly sick  . Vertigo     Past Surgical History:  Procedure Laterality Date  . BREAST BIOPSY    . ELBOW FRACTURE SURGERY Left   . HERNIA REPAIR     pt denies  . LAMINECTOMY  10/11/11   with fusion @ L4-5  . RETINAL TEAR REPAIR CRYOTHERAPY Right 2015  . TUBAL LIGATION       Social History:  The patient  reports that she has never smoked. She has never used smokeless tobacco. She reports current alcohol use of about 3.0 standard drinks of alcohol per week. She reports that she does not use drugs.   Family History:  The patient's family history includes Diabetes in her brother, father, and sister; Heart disease in her father; Leukemia in her father; Lung cancer in her mother; Ovarian cancer in her mother; Rectal cancer in her sister.    ROS:  Please see the history of present illness. All other systems are reviewed and  Negative to the above problem except as noted.    PHYSICAL EXAM: VS:  BP 122/82   Pulse (!) 53   Ht 5\' 7"  (1.702 m)   Wt 200 lb 1.9 oz (90.8 kg)   BMI 31.34 kg/m   GEN: Well nourished, well developed, in no acute distress  HEENT: normal  Neck: JVP is normal  No carotid bruits, or masses Cardiac: RRR; no murmurs, rubs, or gallops,no edema  Respiratory:  clear to auscultation bilaterally, normal work of breathing GI: soft, nontender, nondistended, + BS  No hepatomegaly  MS: no  deformity Moving all extremities   Skin: warm and dry, no rash Neuro:  Strength and sensation are intact Psych: euthymic mood, full affect   EKG:  EKG shows SB 53 bpm     Lipid Panel    Component Value Date/Time   CHOL 175 10/10/2018 0841   TRIG 97 10/10/2018 0841   HDL 61 10/10/2018 0841   CHOLHDL 2.9 10/10/2018 0841   CHOLHDL 3 01/29/2014 0813   VLDL 20.2 01/29/2014 0813   LDLCALC 95 10/10/2018 0841   LDLDIRECT 125.6 03/28/2012 0922      Wt Readings from Last 3 Encounters:  10/12/18 200 lb 1.9 oz (90.8 kg)  12/05/17 203 lb (92.1 kg)  06/22/17 200 lb (90.7 kg)      ASSESSMENT AND PLAN:  1  CAD   Pt with evid of plaquing on CT    I agree with Statin Rx    She has no symptoms of angina     2.  HL  Goal is LDL in 70s    With achiness I have recomm she stop Crestor   Follow symptoms for a few wks   ACall back with response   If improved will switch to another statin or statin/zetia If no change would resume and increase to 20 mg daily with f/u in 2 months  3 CP  PT denies  4  HTN  BP is well controlled  Will f/u with labs   F/U in clnic in 1 year  Signed, Dorris Carnes, MD  10/12/2018 10:28 AM    [address], [place], [place]  [address] Phone: [phone]; Fax: [phone]

## 2018-10-16 DIAGNOSIS — R69 Illness, unspecified: Secondary | ICD-10-CM | POA: Diagnosis not present

## 2018-11-06 DIAGNOSIS — A084 Viral intestinal infection, unspecified: Secondary | ICD-10-CM | POA: Diagnosis not present

## 2018-11-06 DIAGNOSIS — J309 Allergic rhinitis, unspecified: Secondary | ICD-10-CM | POA: Diagnosis not present

## 2018-11-06 DIAGNOSIS — K219 Gastro-esophageal reflux disease without esophagitis: Secondary | ICD-10-CM | POA: Diagnosis not present

## 2018-11-06 DIAGNOSIS — I10 Essential (primary) hypertension: Secondary | ICD-10-CM | POA: Diagnosis not present

## 2018-12-10 DIAGNOSIS — H33301 Unspecified retinal break, right eye: Secondary | ICD-10-CM | POA: Diagnosis not present

## 2018-12-10 DIAGNOSIS — H04123 Dry eye syndrome of bilateral lacrimal glands: Secondary | ICD-10-CM | POA: Diagnosis not present

## 2018-12-10 DIAGNOSIS — K6282 Dysplasia of anus: Secondary | ICD-10-CM | POA: Diagnosis not present

## 2018-12-10 DIAGNOSIS — H2513 Age-related nuclear cataract, bilateral: Secondary | ICD-10-CM | POA: Diagnosis not present

## 2018-12-10 DIAGNOSIS — H6123 Impacted cerumen, bilateral: Secondary | ICD-10-CM | POA: Diagnosis not present

## 2018-12-10 DIAGNOSIS — H524 Presbyopia: Secondary | ICD-10-CM | POA: Diagnosis not present

## 2018-12-11 DIAGNOSIS — K1370 Unspecified lesions of oral mucosa: Secondary | ICD-10-CM | POA: Diagnosis not present

## 2018-12-11 DIAGNOSIS — K1329 Other disturbances of oral epithelium, including tongue: Secondary | ICD-10-CM | POA: Diagnosis not present

## 2018-12-11 DIAGNOSIS — K136 Irritative hyperplasia of oral mucosa: Secondary | ICD-10-CM | POA: Diagnosis not present

## 2018-12-11 DIAGNOSIS — K1239 Other oral mucositis (ulcerative): Secondary | ICD-10-CM | POA: Diagnosis not present

## 2018-12-12 DIAGNOSIS — L308 Other specified dermatitis: Secondary | ICD-10-CM | POA: Diagnosis not present

## 2018-12-12 DIAGNOSIS — Z85828 Personal history of other malignant neoplasm of skin: Secondary | ICD-10-CM | POA: Diagnosis not present

## 2018-12-12 DIAGNOSIS — L271 Localized skin eruption due to drugs and medicaments taken internally: Secondary | ICD-10-CM | POA: Diagnosis not present

## 2018-12-12 DIAGNOSIS — L128 Other pemphigoid: Secondary | ICD-10-CM | POA: Diagnosis not present

## 2018-12-19 DIAGNOSIS — D225 Melanocytic nevi of trunk: Secondary | ICD-10-CM | POA: Diagnosis not present

## 2018-12-19 DIAGNOSIS — Z85828 Personal history of other malignant neoplasm of skin: Secondary | ICD-10-CM | POA: Diagnosis not present

## 2018-12-19 DIAGNOSIS — D1801 Hemangioma of skin and subcutaneous tissue: Secondary | ICD-10-CM | POA: Diagnosis not present

## 2018-12-19 DIAGNOSIS — L270 Generalized skin eruption due to drugs and medicaments taken internally: Secondary | ICD-10-CM | POA: Diagnosis not present

## 2018-12-19 DIAGNOSIS — D2261 Melanocytic nevi of right upper limb, including shoulder: Secondary | ICD-10-CM | POA: Diagnosis not present

## 2018-12-19 DIAGNOSIS — L814 Other melanin hyperpigmentation: Secondary | ICD-10-CM | POA: Diagnosis not present

## 2019-03-13 ENCOUNTER — Telehealth: Payer: Self-pay | Admitting: Internal Medicine

## 2019-03-13 NOTE — Telephone Encounter (Signed)
Patient has been scheduled for October 9th, 2020. She wishes to see Dr. Harrington Challenger in office instead of virtual.

## 2019-03-13 NOTE — Telephone Encounter (Signed)
Would recommend trying Zantac regularly and follow symptoms Call if continues, worsens Plan for f/u in person this fall

## 2019-03-13 NOTE — Telephone Encounter (Signed)
Called patient. She reports she has been having some indigestions/discomfort in her chest on and off for 3-4 days. She denies shortness of breath or pain. She had a hard time describing what she is feeling. She has noticed the indigestion after eating and a slight discomfort in her chest at night. She tried Gaviscon for indigestion last night and it did help. Her chart has her on zantac however she is not longer taking. I will update this. She was taken off of crestor in January and is supposed to be seeing Dr. Harrington Challenger in the end of August. She doesn't have an appointment I will reach out to schedulers to get her scheduled. Her heart rate and blood pressure have been stable. I have advised her to monitor her symptoms if anything was to intensify or get worse she was recommended to go to the emergency room. In the meantime I will consult with Dr. Harrington Challenger to see if there are any other recommendations at this time. Patient verbally understood. No further questions at this time.

## 2019-03-13 NOTE — Telephone Encounter (Signed)
Patient would like to talk to nurse. She is having some pressure and indigestion.

## 2019-03-18 NOTE — Telephone Encounter (Signed)
Called patient back. Patient notices episodes of symptoms are much less frequent than they had been.  She was eating a lot of tomatoes and thinks that may have been the cause.  Gaviscon relieved her symptoms. She prefers not to take a medication every day, and if she can eliminate the potential cause of symptoms she will do this.  Adv to call if Gaviscon does not work, the symptoms change or worsen or become more frequent.  Otherwise keep Oct appointment in office with Dr. Harrington Challenger.

## 2019-03-22 DIAGNOSIS — M549 Dorsalgia, unspecified: Secondary | ICD-10-CM | POA: Diagnosis not present

## 2019-03-25 DIAGNOSIS — R05 Cough: Secondary | ICD-10-CM | POA: Diagnosis not present

## 2019-03-25 DIAGNOSIS — J069 Acute upper respiratory infection, unspecified: Secondary | ICD-10-CM | POA: Insufficient documentation

## 2019-03-25 DIAGNOSIS — R059 Cough, unspecified: Secondary | ICD-10-CM | POA: Insufficient documentation

## 2019-04-19 NOTE — Progress Notes (Signed)
Cardiology Office Note   Date:  04/22/2019   ID:  Cynthia Padilla, DOB [birth year], MRN [id number]  PCP:  Velna Hatchet, MD  Cardiologist:   Dorris Carnes, MD    Pt presents for f/u of CAD and HL    History of Present Illness: Cynthia Padilla is a 68 y.o. female with a history of chest pain   I saw her in 2017  Pain was atypical for coronary ischemia    Given strong FHx of CAD I recomm a calcium score CT   Finally she had this done in Jan 2020 Ca score was 51.9   The aorta was noted to be 41 mm  Atherosclerosis noted in aorta  I last saw her in clinic in Feb 2020 Since then she continues to stay active  Does pilates a few times per week  Walking   No symptoms  At night has had a cough, wheezing   Rx with ABX and omeprazole   Has improved some but tapering off of omeprazole now.    Denies symptoms with activity    Current Outpatient Medications  Medication Sig Dispense Refill  . acetaminophen (TYLENOL) 500 MG tablet Take 1,000 mg by mouth daily as needed for headache.    . hydrochlorothiazide (HYDRODIURIL) 25 MG tablet Take 25 mg by mouth daily.     . meclizine (ANTIVERT) 25 MG tablet Take 1 tablet (25 mg total) by mouth 3 (three) times daily as needed. 30 tablet 0  . Polyethyl Glycol-Propyl Glycol (SYSTANE OP) Place 1 drop into both eyes daily as needed (dry eyes, allergies).     Current Facility-Administered Medications  Medication Dose Route Frequency Provider Last Rate Last Dose  . 0.9 %  sodium chloride infusion  500 mL Intravenous Continuous Irene Shipper, MD        Allergies:   Patient has no known allergies.   Past Medical History:  Diagnosis Date  . Anxiety   . Arthritis    thinks she has some in her spine, bursitis in hips  . Asthma    had it once when she had episode of bronchitis about 10 yrs ago  . Diverticulitis   . DIVERTICULOSIS, COLON 11/04/2004   Qualifier: Diagnosis of  By: Hardin Negus CMA (AAMA), Colletta [place]    . GERD (gastroesophageal reflux disease)   .  Hepatitis    thinks she had Hepatits as a child  . History of colonic polyps   . Hyperlipidemia   . Hypertension   . Internal hemorrhoids   . PONV (postoperative nausea and vomiting)    states she gets deathly sick  . Vertigo     Past Surgical History:  Procedure Laterality Date  . BREAST BIOPSY    . ELBOW FRACTURE SURGERY Left   . HERNIA REPAIR     pt denies  . LAMINECTOMY  10/11/11   with fusion @ L4-5  . RETINAL TEAR REPAIR CRYOTHERAPY Right 2015  . TUBAL LIGATION       Social History:  The patient  reports that she has never smoked. She has never used smokeless tobacco. She reports current alcohol use of about 3.0 standard drinks of alcohol per week. She reports that she does not use drugs.   Family History:  The patient's family history includes Diabetes in her brother, father, and sister; Heart disease in her father; Leukemia in her father; Lung cancer in her mother; Ovarian cancer in her mother; Rectal cancer in her sister.    ROS:  Please  see the history of present illness. All other systems are reviewed and  Negative to the above problem except as noted.    PHYSICAL EXAM: VS:  BP 124/80   Pulse 60   Ht 5\' 7"  (1.702 m)   Wt 201 lb 12.8 oz (91.5 kg)   BMI 31.61 kg/m   GEN: Well nourished, well developed, in no acute distress  HEENT: normal  Neck: JVP is normal  No carotid bruits, or masses Cardiac: RRR; no murmurs, rubs, or gallops,no edema  Respiratory:  clear to auscultation bilaterally, normal work of breathing GI: soft, nontender, nondistended, + BS  No hepatomegaly  MS: no deformity Moving all extremities   Skin: warm and dry, no rash Neuro:  Strength and sensation are intact Psych: euthymic mood, full affect   EKG:  EKG shows SB 60 bpm     Lipid Panel    Component Value Date/Time   CHOL 175 10/10/2018 0841   TRIG 97 10/10/2018 0841   HDL 61 10/10/2018 0841   CHOLHDL 2.9 10/10/2018 0841   CHOLHDL 3 01/29/2014 0813   VLDL 20.2 01/29/2014 0813    LDLCALC 95 10/10/2018 0841   LDLDIRECT 125.6 03/28/2012 0922      Wt Readings from Last 3 Encounters:  04/22/19 201 lb 12.8 oz (91.5 kg)  10/12/18 200 lb 1.9 oz (90.8 kg)  12/05/17 203 lb (92.1 kg)      ASSESSMENT AND PLAN:  1  CAD  Pt with mild plaqing on CT scan   I am not convinced of active angina  Cont to push for risk factor modification  2  Aortic dilitation  F/U CT in Jan  3  HL  Did not tolerate Crestor due to some achiness   Will try Lipitor 10  F/U lipids andAST in 8 wks  4  Cough/wheeze   Suspicious for reflux   Discussed diet, weight loss  OK to try omeprazole again  5  Wt  Discussed wt loss   She just started skipping breakfast     6   HTN  BP is good Signed, Dorris Carnes, MD  04/22/2019 8:21 AM    [address], [place], [place]  [address] Phone: [phone]; Fax: [phone]

## 2019-04-22 ENCOUNTER — Other Ambulatory Visit: Payer: Self-pay

## 2019-04-22 ENCOUNTER — Ambulatory Visit (INDEPENDENT_AMBULATORY_CARE_PROVIDER_SITE_OTHER): Payer: Medicare HMO | Admitting: Internal Medicine

## 2019-04-22 ENCOUNTER — Encounter: Payer: Self-pay | Admitting: Internal Medicine

## 2019-04-22 VITALS — BP 124/80 | HR 60 | Ht 67.0 in | Wt 201.8 lb

## 2019-04-22 DIAGNOSIS — E785 Hyperlipidemia, unspecified: Secondary | ICD-10-CM | POA: Diagnosis not present

## 2019-04-22 MED ORDER — ATORVASTATIN CALCIUM 10 MG PO TABS: 10.0000 mg | ORAL_TABLET | Freq: Every day | ORAL | 3 refills | Status: DC

## 2019-04-22 NOTE — Patient Instructions (Signed)
Medication Instructions:  Your physician has recommended you make the following change in your medication:  1.) start atorvastatin (Lipitor) 10 mg once daily for cholesterol  If you need a refill on your cardiac medications before your next appointment, please call your pharmacy.   Lab work: In about 8 weeks -lipids/ast If you have labs (blood work) drawn today and your tests are completely normal, you will receive your results only by: Marland Kitchen MyChart Message (if you have MyChart) OR . A paper copy in the mail If you have any lab test that is abnormal or we need to change your treatment, we will call you to review the results.  Testing/Procedures: Chest CT --already scheduled for January.  Follow-Up: At [facility], you and your health needs are our priority.  As part of our continuing mission to provide you with exceptional heart care, we have created designated Provider Care Teams.  These Care Teams include your primary Cardiologist (physician) and Advanced Practice Providers (APPs -  Physician Assistants and Nurse Practitioners) who all work together to provide you with the care you need, when you need it. You will need a follow up appointment in:  9 months.  Please call our office 2 months in advance to schedule this appointment.  You may see Dr. Dorris Carnes or one of the following Advanced Practice Providers on your designated Care Team: Richardson Dopp, PA-C [place], [place] . Daune Perch, NP  Any Other Special Instructions Will Be Listed Below (If Applicable).

## 2019-05-07 NOTE — Telephone Encounter (Signed)
I would try Livalo 1 mg   See how feels  F?U lipids in 8 wks

## 2019-05-08 MED ORDER — PITAVASTATIN CALCIUM 1 MG PO TABS: 1.0000 mg | ORAL_TABLET | Freq: Every day | ORAL | 11 refills | Status: DC

## 2019-05-08 NOTE — Telephone Encounter (Signed)
dc'd lipitor.  Placed order for Pitavastatin 1 mg daily.  Replied to pt's mychart message informing.  I asked that she let me know if she is having any issues tolerating this medication.  We will keep her lab appointment that had been previously scheduled for 06/18/19. Added lipitor to allergy list as intolerance.

## 2019-05-08 NOTE — Telephone Encounter (Signed)
Would try pravastatin 20 mg F/Ulipids in 8 wks   Call/ email if doesn't tolerate

## 2019-05-13 MED ORDER — PRAVASTATIN SODIUM 20 MG PO TABS: 20.0000 mg | ORAL_TABLET | Freq: Every evening | ORAL | 6 refills | Status: DC

## 2019-05-13 NOTE — Addendum Note (Signed)
Addended by: Rodman Key on: 05/13/2019 01:33 PM   Modules accepted: Orders

## 2019-05-13 NOTE — Telephone Encounter (Signed)
Can try 2.5 mg Crestor   F/U lipids in 8 wks with CK and AST

## 2019-05-15 ENCOUNTER — Other Ambulatory Visit: Payer: Self-pay | Admitting: *Deleted

## 2019-05-15 DIAGNOSIS — E785 Hyperlipidemia, unspecified: Secondary | ICD-10-CM

## 2019-05-15 MED ORDER — ROSUVASTATIN CALCIUM 5 MG PO TABS: 2.5000 mg | ORAL_TABLET | Freq: Every day | ORAL | 3 refills | Status: DC

## 2019-05-15 NOTE — Addendum Note (Signed)
Addended by: Rodman Key on: 05/15/2019 09:11 AM   Modules accepted: Orders

## 2019-05-17 ENCOUNTER — Ambulatory Visit: Payer: Medicare HMO | Admitting: Internal Medicine

## 2019-05-22 DIAGNOSIS — J45998 Other asthma: Secondary | ICD-10-CM | POA: Diagnosis not present

## 2019-05-22 DIAGNOSIS — R05 Cough: Secondary | ICD-10-CM | POA: Diagnosis not present

## 2019-05-22 DIAGNOSIS — J45909 Unspecified asthma, uncomplicated: Secondary | ICD-10-CM | POA: Insufficient documentation

## 2019-05-22 DIAGNOSIS — Z23 Encounter for immunization: Secondary | ICD-10-CM | POA: Diagnosis not present

## 2019-05-22 DIAGNOSIS — E785 Hyperlipidemia, unspecified: Secondary | ICD-10-CM | POA: Diagnosis not present

## 2019-05-22 DIAGNOSIS — I2510 Atherosclerotic heart disease of native coronary artery without angina pectoris: Secondary | ICD-10-CM | POA: Diagnosis not present

## 2019-05-22 DIAGNOSIS — J302 Other seasonal allergic rhinitis: Secondary | ICD-10-CM | POA: Diagnosis not present

## 2019-05-31 ENCOUNTER — Encounter (INDEPENDENT_AMBULATORY_CARE_PROVIDER_SITE_OTHER): Payer: Self-pay

## 2019-06-18 ENCOUNTER — Other Ambulatory Visit: Payer: Medicare HMO

## 2019-06-19 ENCOUNTER — Other Ambulatory Visit: Payer: Self-pay | Admitting: Obstetrics and Gynecology

## 2019-06-19 DIAGNOSIS — Z1231 Encounter for screening mammogram for malignant neoplasm of breast: Secondary | ICD-10-CM

## 2019-07-01 DIAGNOSIS — K6282 Dysplasia of anus: Secondary | ICD-10-CM | POA: Diagnosis not present

## 2019-07-08 ENCOUNTER — Other Ambulatory Visit: Payer: Self-pay

## 2019-07-08 ENCOUNTER — Other Ambulatory Visit: Payer: Medicare HMO | Admitting: *Deleted

## 2019-07-08 DIAGNOSIS — I712 Thoracic aortic aneurysm, without rupture, unspecified: Secondary | ICD-10-CM

## 2019-07-08 DIAGNOSIS — Z01812 Encounter for preprocedural laboratory examination: Secondary | ICD-10-CM

## 2019-07-08 DIAGNOSIS — E785 Hyperlipidemia, unspecified: Secondary | ICD-10-CM | POA: Diagnosis not present

## 2019-07-08 LAB — BASIC METABOLIC PANEL
BUN/Creatinine Ratio: 16 (ref 12–28)
BUN: 13 mg/dL (ref 8–27)
CO2: 25 mmol/L (ref 20–29)
Calcium: 9.7 mg/dL (ref 8.7–10.3)
Chloride: 98 mmol/L (ref 96–106)
Creatinine, Ser: 0.79 mg/dL (ref 0.57–1.00)
GFR calc Af Amer: 88 mL/min/{1.73_m2} (ref >59)
GFR calc non Af Amer: 76 mL/min/{1.73_m2} (ref >59)
Glucose: 129 mg/dL — ABNORMAL HIGH (ref 65–99)
Potassium: 3.8 mmol/L (ref 3.5–5.2)
Sodium: 139 mmol/L (ref 134–144)

## 2019-07-08 LAB — CK: Total CK: 106 U/L (ref 32–182)

## 2019-07-08 LAB — LIPID PANEL
Chol/HDL Ratio: 3 ratio (ref 0.0–4.4)
Cholesterol, Total: 182 mg/dL (ref 100–199)
HDL: 60 mg/dL (ref >39)
LDL Chol Calc (NIH): 102 mg/dL — ABNORMAL HIGH (ref 0–99)
Triglycerides: 111 mg/dL (ref 0–149)
VLDL Cholesterol Cal: 20 mg/dL (ref 5–40)

## 2019-07-08 LAB — AST: AST: 21 IU/L (ref 0–40)

## 2019-07-10 DIAGNOSIS — E785 Hyperlipidemia, unspecified: Secondary | ICD-10-CM

## 2019-07-11 NOTE — Telephone Encounter (Signed)
Stop Crestor Would recomm Repatha 140 mg  2x per month    Refer to lipid clinic for training

## 2019-07-12 NOTE — Telephone Encounter (Signed)
Left detailed VM message for patient that other labs were normal except glucose which was a little high at 129.  Adv that Dr. Harrington Challenger is referring her to [facility] to discuss Repatha and to stop Crestor.  Also sent this information to her in this MyChart message.

## 2019-07-15 ENCOUNTER — Other Ambulatory Visit: Payer: Medicare HMO

## 2019-07-18 DIAGNOSIS — Z85828 Personal history of other malignant neoplasm of skin: Secondary | ICD-10-CM | POA: Diagnosis not present

## 2019-07-18 DIAGNOSIS — Z419 Encounter for procedure for purposes other than remedying health state, unspecified: Secondary | ICD-10-CM | POA: Diagnosis not present

## 2019-07-31 NOTE — Telephone Encounter (Signed)
Would recomm retrial of Crestor 5 mg daily   F/U of lipids in 8 wks

## 2019-08-05 ENCOUNTER — Encounter: Payer: Self-pay | Admitting: General Practice

## 2019-08-12 DIAGNOSIS — E7849 Other hyperlipidemia: Secondary | ICD-10-CM | POA: Diagnosis not present

## 2019-08-12 DIAGNOSIS — I10 Essential (primary) hypertension: Secondary | ICD-10-CM | POA: Diagnosis not present

## 2019-08-14 ENCOUNTER — Other Ambulatory Visit: Payer: Self-pay

## 2019-08-14 ENCOUNTER — Ambulatory Visit
Admission: RE | Admit: 2019-08-14 | Discharge: 2019-08-14 | Disposition: A | Discharge status: Home / Self Care | Payer: Medicare HMO | Source: Ambulatory Visit | Attending: Obstetrics and Gynecology | Admitting: Obstetrics and Gynecology

## 2019-08-14 DIAGNOSIS — Z1231 Encounter for screening mammogram for malignant neoplasm of breast: Secondary | ICD-10-CM

## 2019-08-14 DIAGNOSIS — I10 Essential (primary) hypertension: Secondary | ICD-10-CM | POA: Diagnosis not present

## 2019-08-14 DIAGNOSIS — R82998 Other abnormal findings in urine: Secondary | ICD-10-CM | POA: Diagnosis not present

## 2019-08-14 IMAGING — MG DIGITAL SCREENING BILAT W/ TOMO W/ CAD
8 series · 8 of 24 positions shown · non-contrast
Comparison: Previous exam(s).

CLINICAL DATA: Screening.

EXAM:
DIGITAL SCREENING BILATERAL MAMMOGRAM WITH TOMO AND CAD

[R MLO synth-2D]
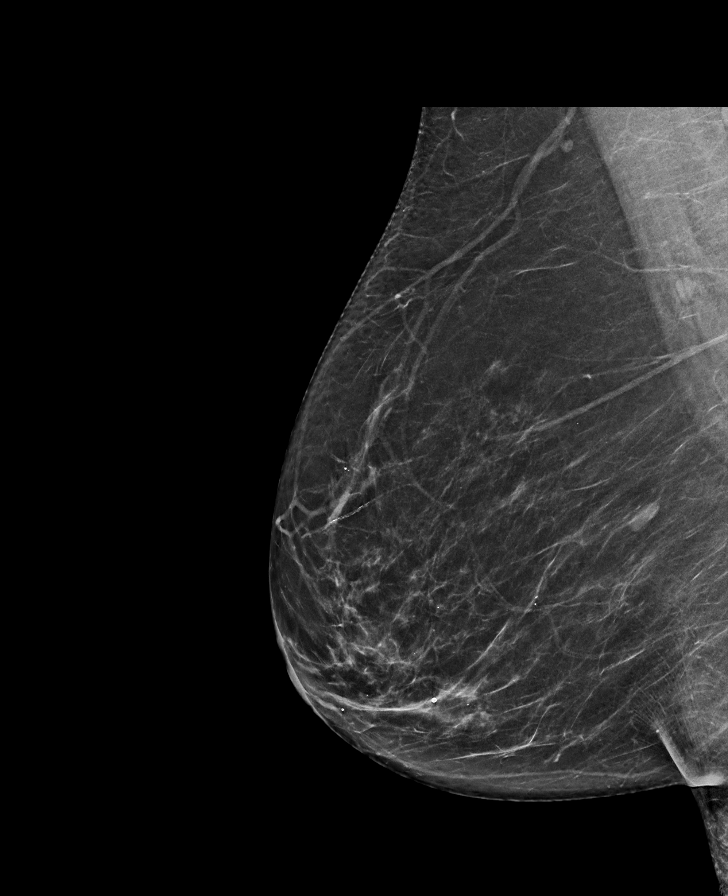

[L MLO synth-2D]
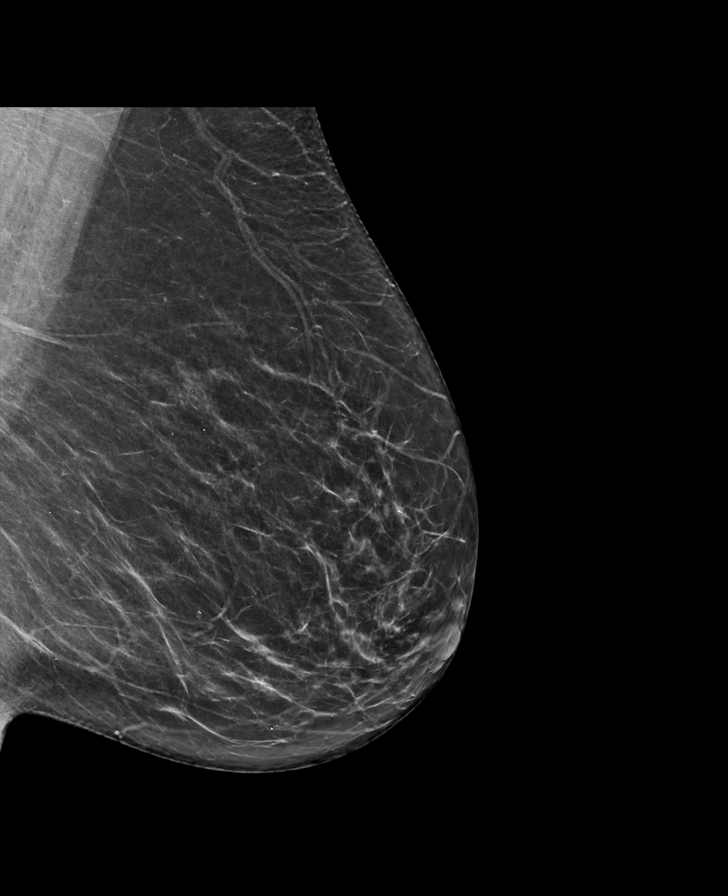

[R CC synth-2D]
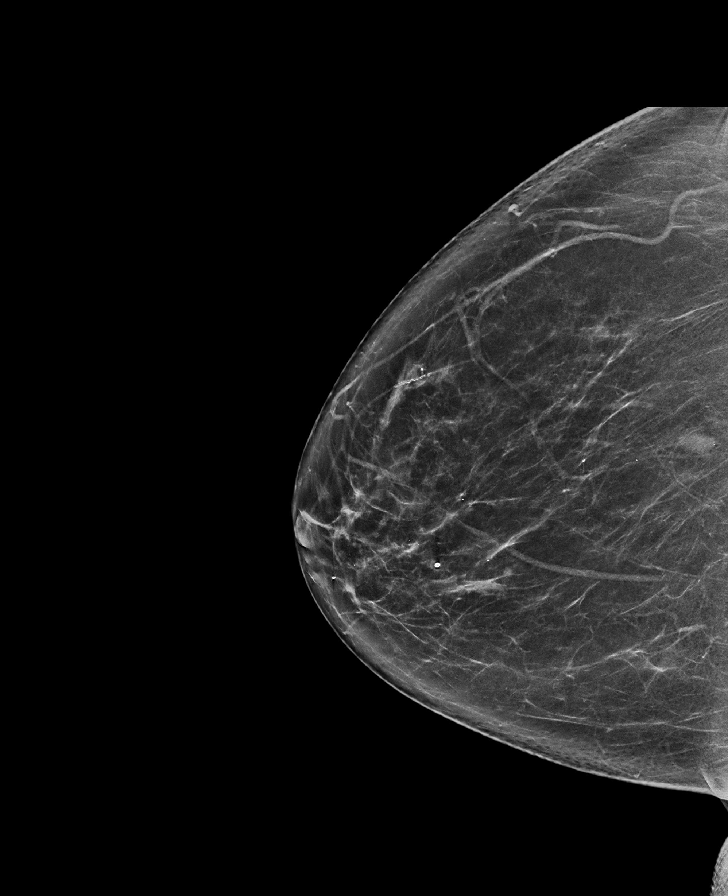

[L CC synth-2D]
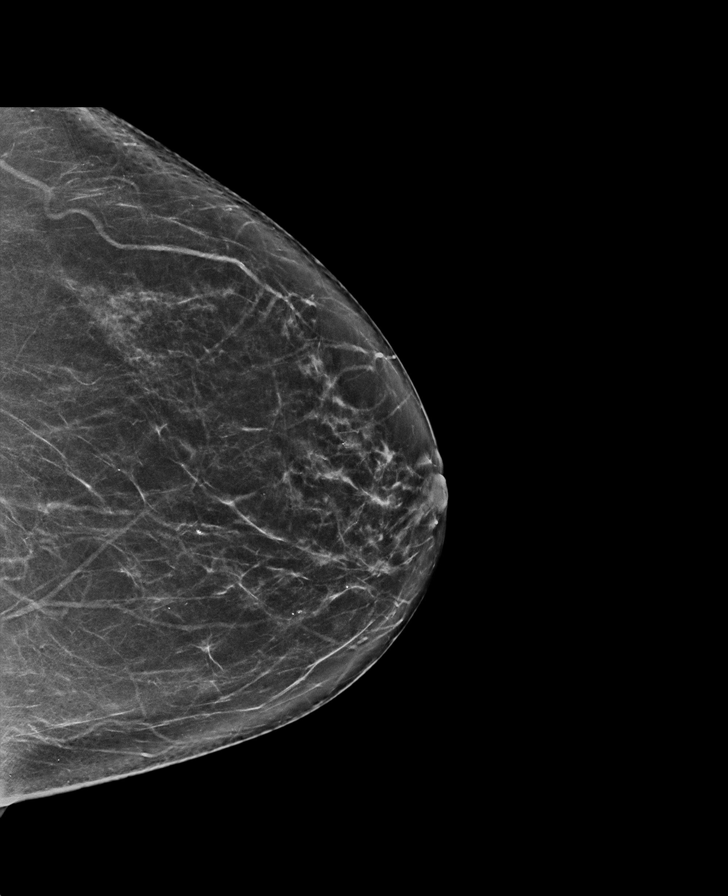

[L MLO tomo · tomo slice 37/72.0]
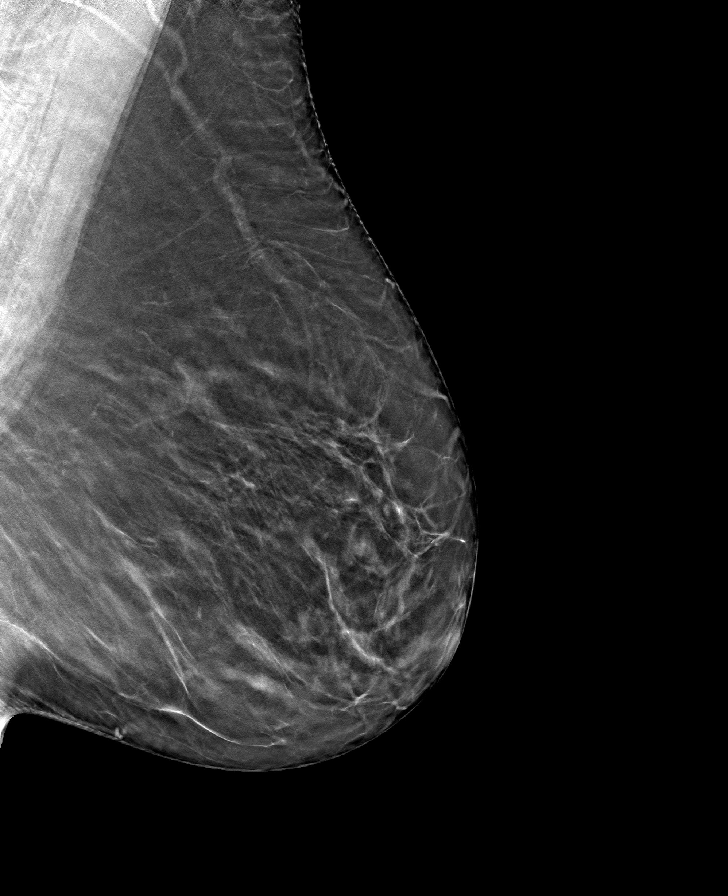

[L CC tomo · tomo slice 37/72.0]
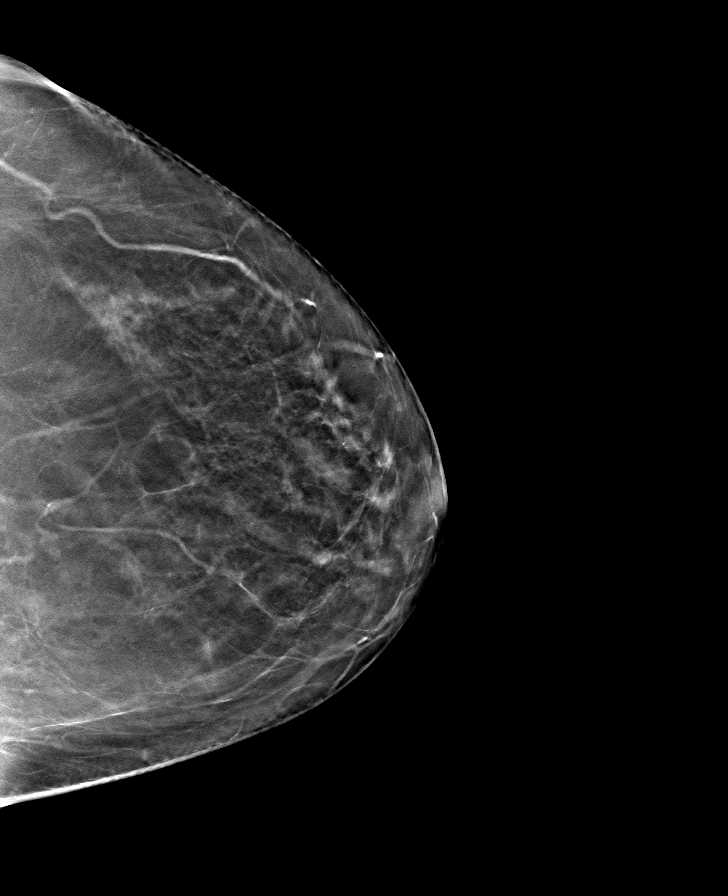

[R CC tomo · tomo slice 38/75.0]
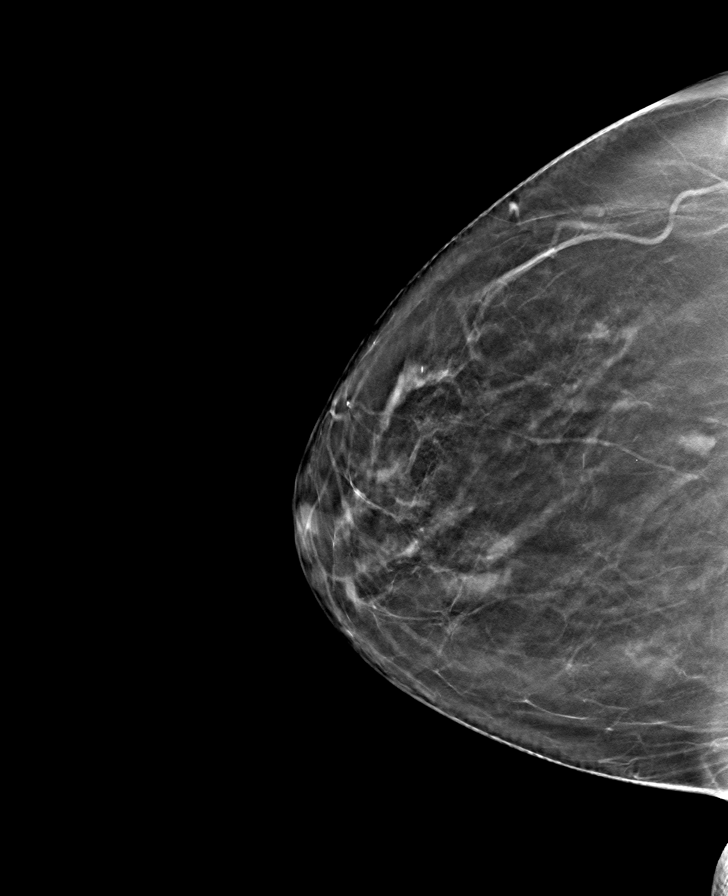

[R MLO tomo · tomo slice 37/73.0]
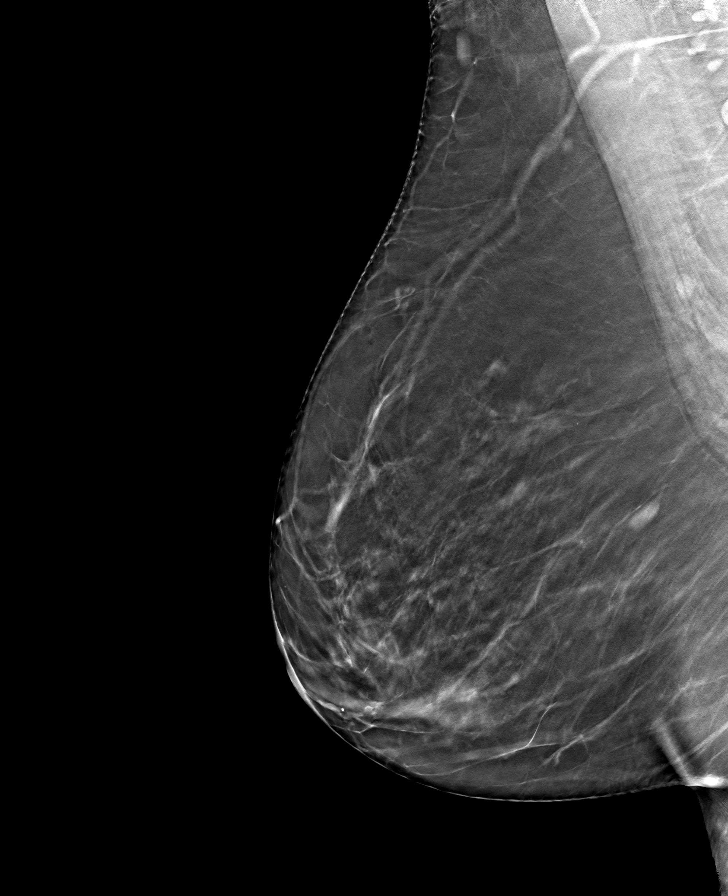

[8 of 24 positions shown; findings below may reference images not displayed]

ACR Breast Density Category b: There are scattered areas of
fibroglandular density.
FINDINGS: There are no findings suspicious for malignancy. Images were
processed with CAD.
IMPRESSION: No mammographic evidence of malignancy. A result letter of this
screening mammogram will be mailed directly to the patient.

RECOMMENDATION:
Screening mammogram in one year. (Code:[TQ])

BI-RADS CATEGORY  1: Negative.

## 2019-08-19 ENCOUNTER — Ambulatory Visit (INDEPENDENT_AMBULATORY_CARE_PROVIDER_SITE_OTHER)
Admission: RE | Admit: 2019-08-19 | Discharge: 2019-08-19 | Disposition: A | Discharge status: Home / Self Care | Payer: Medicare HMO | Source: Ambulatory Visit | Attending: Internal Medicine | Admitting: Internal Medicine

## 2019-08-19 ENCOUNTER — Ambulatory Visit (INDEPENDENT_AMBULATORY_CARE_PROVIDER_SITE_OTHER): Payer: Medicare HMO | Admitting: Pharmacist

## 2019-08-19 ENCOUNTER — Other Ambulatory Visit: Payer: Self-pay

## 2019-08-19 DIAGNOSIS — R05 Cough: Secondary | ICD-10-CM | POA: Diagnosis not present

## 2019-08-19 DIAGNOSIS — Z1331 Encounter for screening for depression: Secondary | ICD-10-CM | POA: Diagnosis not present

## 2019-08-19 DIAGNOSIS — I712 Thoracic aortic aneurysm, without rupture, unspecified: Secondary | ICD-10-CM

## 2019-08-19 DIAGNOSIS — Z86010 Personal history of colonic polyps: Secondary | ICD-10-CM | POA: Diagnosis not present

## 2019-08-19 DIAGNOSIS — M791 Myalgia, unspecified site: Secondary | ICD-10-CM | POA: Diagnosis not present

## 2019-08-19 DIAGNOSIS — Z0000 Encounter for general adult medical examination without abnormal findings: Secondary | ICD-10-CM | POA: Diagnosis not present

## 2019-08-19 DIAGNOSIS — I10 Essential (primary) hypertension: Secondary | ICD-10-CM | POA: Diagnosis not present

## 2019-08-19 DIAGNOSIS — K219 Gastro-esophageal reflux disease without esophagitis: Secondary | ICD-10-CM | POA: Diagnosis not present

## 2019-08-19 DIAGNOSIS — E782 Mixed hyperlipidemia: Secondary | ICD-10-CM | POA: Diagnosis not present

## 2019-08-19 DIAGNOSIS — E785 Hyperlipidemia, unspecified: Secondary | ICD-10-CM | POA: Diagnosis not present

## 2019-08-19 DIAGNOSIS — I2510 Atherosclerotic heart disease of native coronary artery without angina pectoris: Secondary | ICD-10-CM | POA: Diagnosis not present

## 2019-08-19 IMAGING — CT CT ANGIO CHEST
2 of 7 series · 18 of 46 positions shown · IV contrast (OMNIPAQUE 350)
Comparison: [DATE]

CLINICAL DATA: Thoracic aortic aneurysm.

EXAM:
CT ANGIOGRAPHY CHEST WITH CONTRAST
TECHNIQUE: Multidetector CT imaging of the chest was performed using the
standard protocol during bolus administration of intravenous
contrast. Multiplanar CT image reconstructions and MIPs were
obtained to evaluate the vascular anatomy.
CONTRAST:  100mL OMNIPAQUE IOHEXOL 350 MG/ML SOLN

[Series 5: thins · axial · 0.82mm/px · z∈[-485,-266]mm · 15 of 241 slices shown]
[im 11/241  lung]
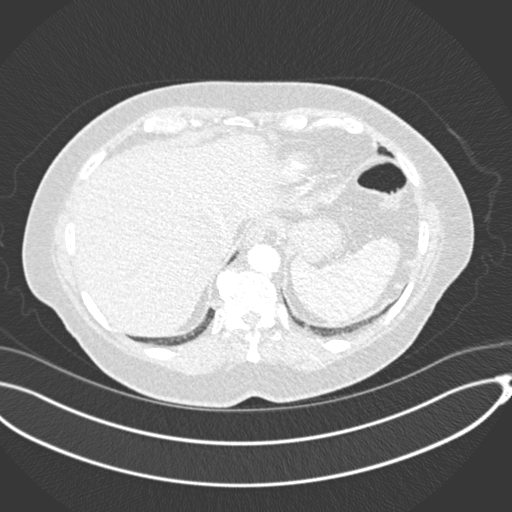
[im 32/241  soft-tissue]
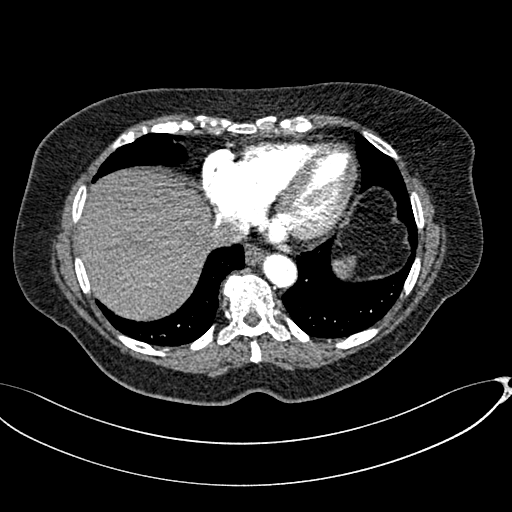
[im 42/241  lung]
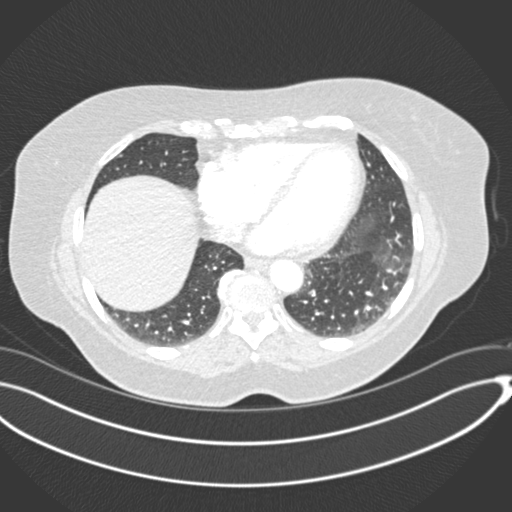
[im 63/241  soft-tissue]
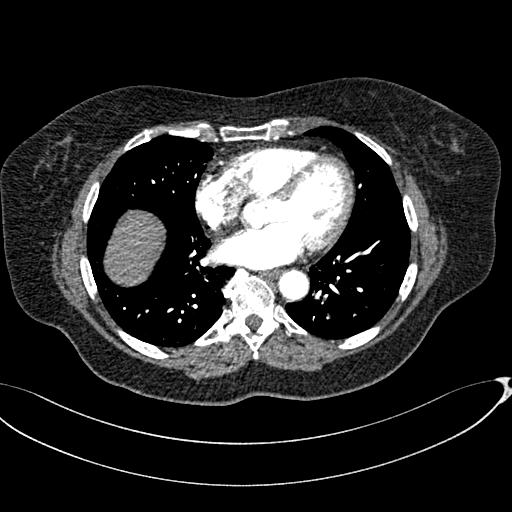
[im 74/241  lung]
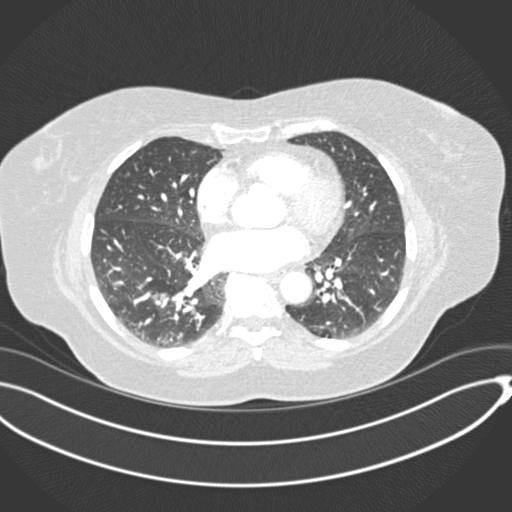
[im 94/241  soft-tissue]
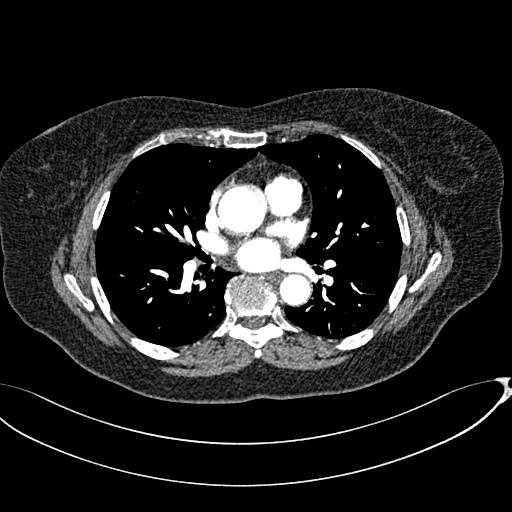
[im 105/241  lung]
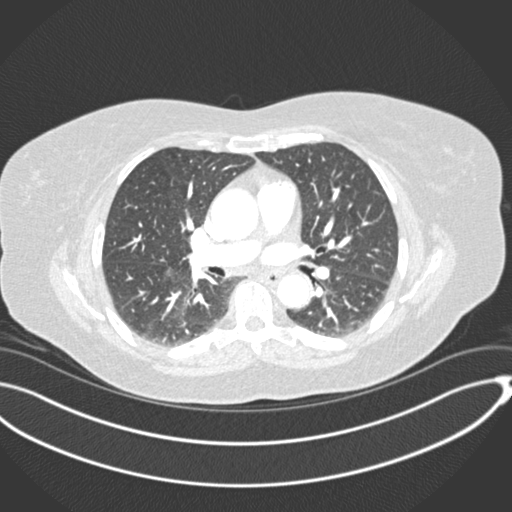
[im 126/241  soft-tissue]
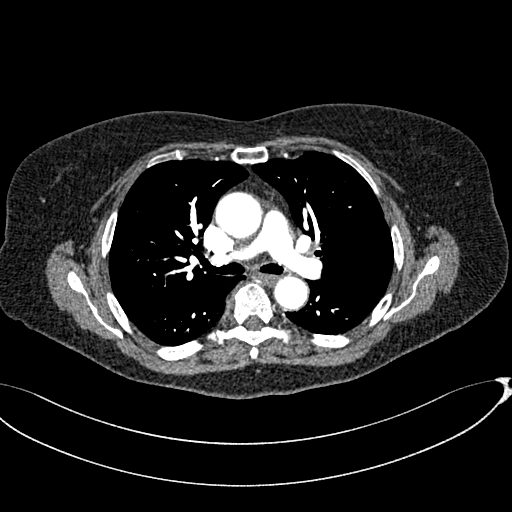
[im 136/241  lung]
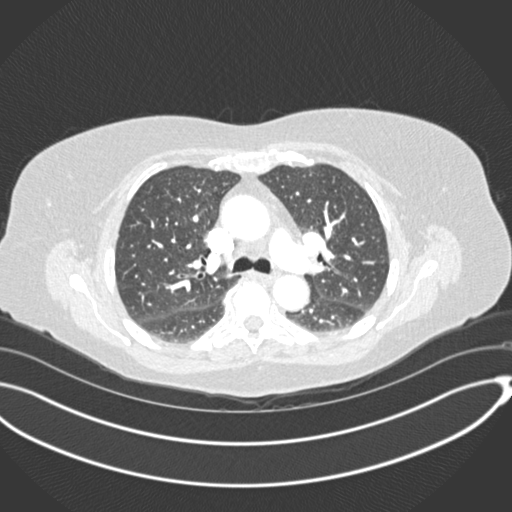
[im 147/241  soft-tissue]
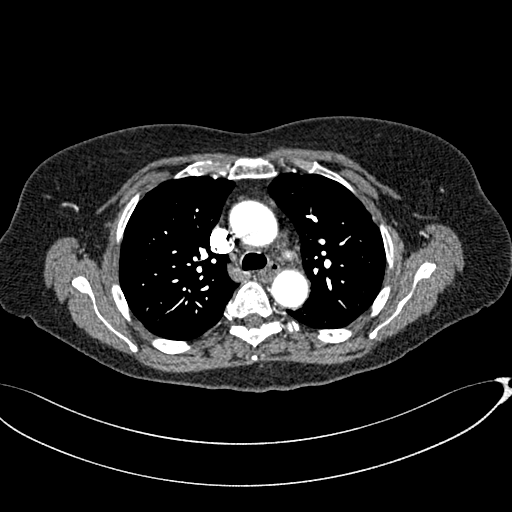
[im 167/241  lung]
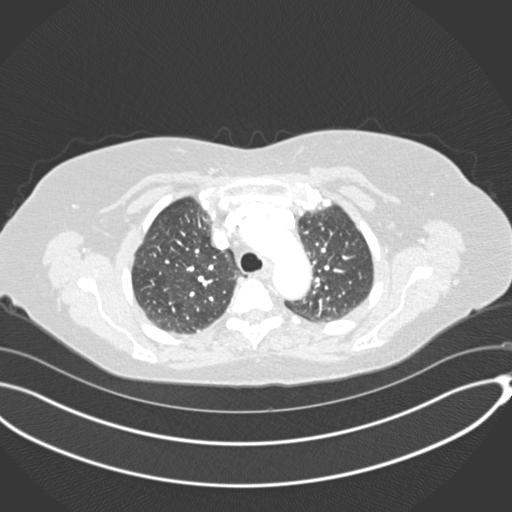
[im 178/241  soft-tissue]
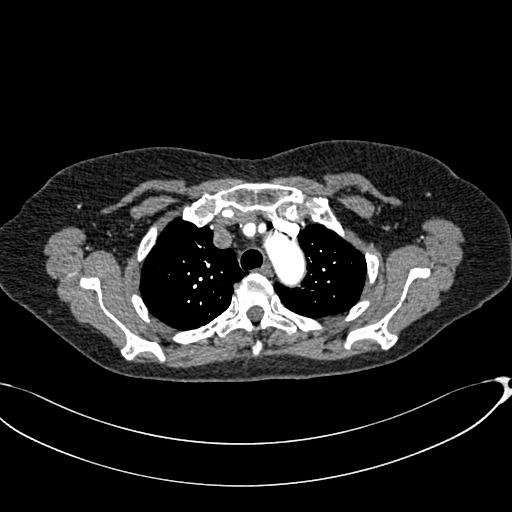
[im 199/241  lung]
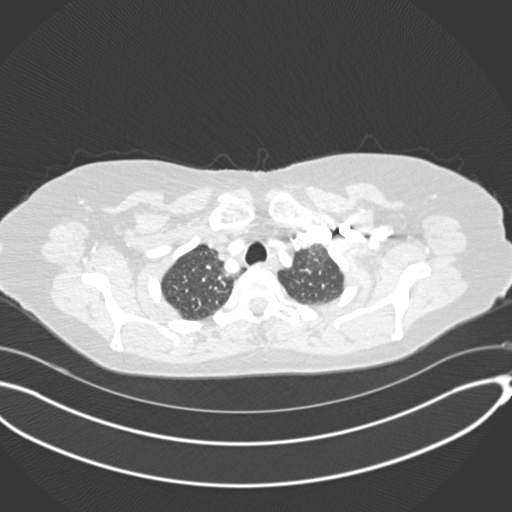
[im 209/241  soft-tissue]
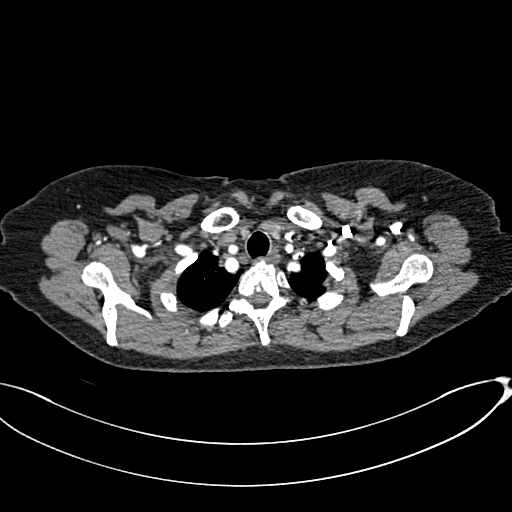
[im 230/241  lung]
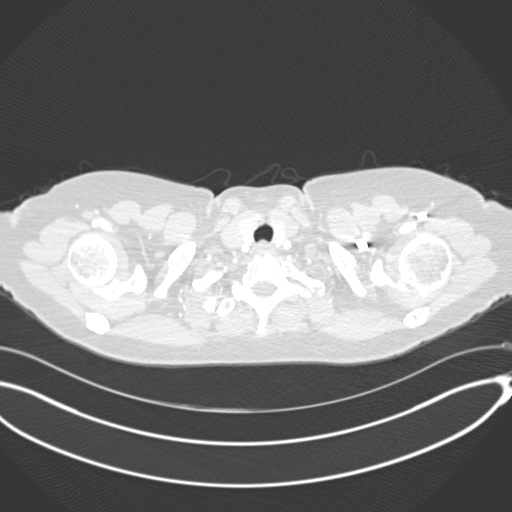

[Series 7: coronal mpr · coronal · 0.48mm/px · 3 of 125 slices shown]
[im 32/125  soft-tissue]
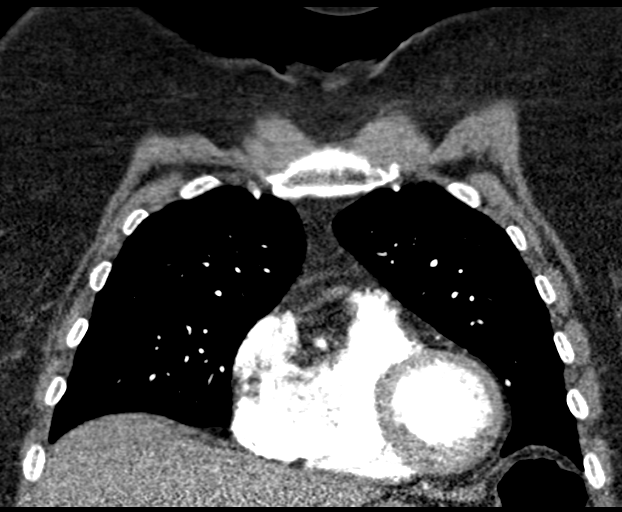
[im 63/125  soft-tissue]
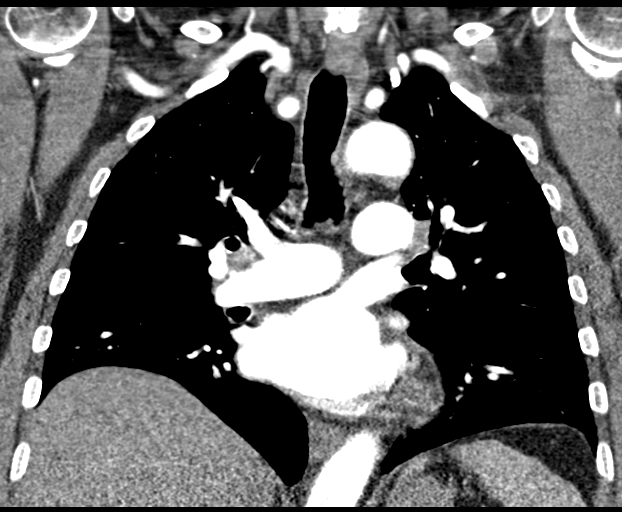
[im 94/125  soft-tissue]
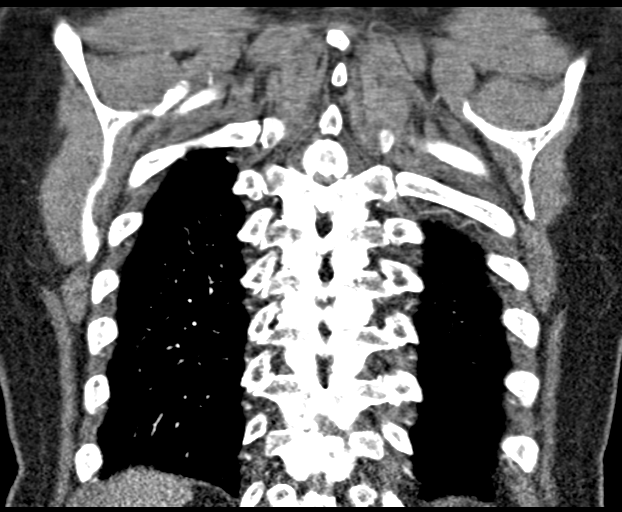

[18 of 46 positions shown; findings below may reference images not displayed]

FINDINGS: Cardiovascular: There is no evidence for thoracic aortic dissection.
On this contrast enhanced examination, the ascending aorta does not
appear to be aneurysmal measuring approximately 3.8 cm in diameter.
Minimal atherosclerotic changes are noted of the thoracic aorta.
There is no large centrally located pulmonary embolism. There is no
significant pericardial effusion. The heart size is normal. Coronary
artery calcifications are noted.

Mediastinum/Nodes:

--No mediastinal or hilar lymphadenopathy.

--No axillary lymphadenopathy.

--No supraclavicular lymphadenopathy.

--Normal thyroid gland.

--The esophagus is unremarkable

Lungs/Pleura: There is a 5 mm pulmonary nodule in the peripheral
right upper lobe (axial series 6, image 20). This was outside the
field of view on the patient's recent CT calcium scoring study. The
lungs are otherwise clear without evidence for a pneumothorax or
pleural effusion. The trachea is unremarkable.

Upper Abdomen: No acute abnormality.

Musculoskeletal: No chest wall abnormality. No acute or significant
osseous findings.

Review of the MIP images confirms the above findings.
IMPRESSION: 1. No evidence for thoracic aortic aneurysm on this contrast
enhanced study. The ascending thoracic aorta measures approximately
3.8 cm on this examination.
2. There is a 5 mm pulmonary nodule in the peripheral right upper
lobe. No follow-up needed if patient is low-risk. Non-contrast chest
CT can be considered in 12 months if patient is high-risk. This
recommendation follows the consensus statement: Guidelines for
Management of Incidental Pulmonary Nodules Detected on CT Images:

## 2019-08-19 MED ORDER — IOHEXOL 350 MG/ML SOLN
100.0000 mL | Freq: Once | INTRAVENOUS | Status: AC | PRN
  Administered 2019-08-19: 100 mL via INTRAVENOUS

## 2019-08-19 MED ORDER — ROSUVASTATIN CALCIUM 5 MG PO TABS: 5.0000 mg | ORAL_TABLET | Freq: Every day | ORAL | 3 refills | Status: DC

## 2019-08-19 NOTE — Patient Instructions (Addendum)
It was nice to see you today  Restart Crestor (rosuvastatin) - take 5mg  tablets once a day  Call Suhayb Anzalone, Pharmacist with any issues tolerating this [phone]  We will recheck fasting cholesterol in 3 months on Monday, April 5th any time after 7:30am  We can consider adding Zetia 10mg  daily in the future if needed

## 2019-08-19 NOTE — Progress Notes (Signed)
Patient ID: Cynthia Padilla                 DOB: 1951/07/02                    MRN: [id number]     HPI: Cynthia Padilla is a 69 y.o. female patient referred to lipid clinic by Dr Harrington Challenger. PMH is significant for strong family history of CAD, elevated calcium score of 51.9 which was 66th percentile based on age and sex matched cohort with atherosclerosis noted in the aorta, chest pain atypical for coronary ischemia, and HTN. She has a history of statin intolerance and presents to lipid clinic for further management - prior concerns with lipid lowering therapy include cost, correlation with elevated blood sugar, and injections.  Pt presents today in good spirits. She did not tolerate rosuvastatin 10mg  daily or atorvastatin 10mg  daily. She experienced body aches on both therapies, especially in her arms. Myalgias presented quicker with atorvastatin. She did feel ok on rosuvastatin 2.5mg  dosing. She does not wish to take injectable medication.  Current Medications: none Intolerances: rosuvastatin 2.5mg  and 10mg  daily - achiness, atorvastatin 10mg  daily - muscle/joint pain, Livalo - cost prohibitive, pravastatin - didn't want blood sugar to increase so never started Risk Factors: elevated calcium score with atherosclerosis noted, family history of CAD LDL goal: 70mg /dL  Diet: Likes bread and chips. Does not snack between meals.  Exercise: Pilates 2 days a week, stretches and walks on the days she doesn't do pilates for 2 miles each.  Family History: The patient's family history includes Diabetes in her brother, father, and sister; Heart disease in her father; Leukemia in her father; Lung cancer in her mother; Ovarian cancer in her mother; Rectal cancer in her sister.   Social History: The patient  reports that she has never smoked. She has never used smokeless tobacco. She reports current alcohol use of about 3.0 standard drinks of alcohol per week. She reports that she does not use drugs.    Labs: 07/08/19: TC 182, TG 111, HDL 60, LDL 102 (rosuvastatin 2.5mg  daily)  Past Medical History:  Diagnosis Date  . Anxiety   . Arthritis    thinks she has some in her spine, bursitis in hips  . Asthma    had it once when she had episode of bronchitis about 10 yrs ago  . Diverticulitis   . DIVERTICULOSIS, COLON 11/04/2004   Qualifier: Diagnosis of  By: Hardin Negus CMA (AAMA), Colletta [place]    . GERD (gastroesophageal reflux disease)   . Hepatitis    thinks she had Hepatits as a child  . History of colonic polyps   . Hyperlipidemia   . Hypertension   . Internal hemorrhoids   . PONV (postoperative nausea and vomiting)    states she gets deathly sick  . Vertigo     Current Outpatient Medications on File Prior to Visit  Medication Sig Dispense Refill  . acetaminophen (TYLENOL) 500 MG tablet Take 1,000 mg by mouth daily as needed for headache.    . hydrochlorothiazide (HYDRODIURIL) 25 MG tablet Take 25 mg by mouth daily.     . meclizine (ANTIVERT) 25 MG tablet Take 1 tablet (25 mg total) by mouth 3 (three) times daily as needed. 30 tablet 0  . Polyethyl Glycol-Propyl Glycol (SYSTANE OP) Place 1 drop into both eyes daily as needed (dry eyes, allergies).     Current Facility-Administered Medications on File Prior to Visit  Medication Dose Route Frequency Provider Last Rate  Last Admin  . 0.9 %  sodium chloride infusion  500 mL Intravenous Continuous Irene Shipper, MD        Allergies  Allergen Reactions  . Atorvastatin Other (See Comments)    myalgia    Assessment/Plan:  1. Hyperlipidemia - LDL 102 on rosuvastatin 2.5mg  daily above aggressive goal < 70 due to family history of CAD and elevated calcium score. Pt agreeable to increasing rosuvastatin to 5mg  daily. Advised her if she experiences muscle aches on this dose that she can try alternating days between 2.5mg  and 5mg  daily dosing. Will recheck lipids in 3 months. If LDL remains above goal, will discuss adding Zetia 10mg  daily.  Pt advised to call clinic with any tolerability issues.  Zissel Biederman E. Brittin Janik, PharmD, BCACP, [place] [id number], [place], [place] [address] Phone: [phone]; Fax: [phone] 08/19/2019 3:07 PM

## 2019-08-20 ENCOUNTER — Telehealth: Payer: Self-pay | Admitting: Internal Medicine

## 2019-08-20 NOTE — Telephone Encounter (Signed)
Called pt   Most likely the same thing seen 4 vs 5 mm is not significant   May be with margin of imaging resolution/cut of film If she wants further evaluation she should request imaging disc from [facility] Then data can be uploaded by radiology with direct comparison I would not just order another CT

## 2019-08-20 NOTE — Telephone Encounter (Signed)
New Message    Pt is calling and says she had a CT done yesterday nad has some questions.   She would like for Dr Cynthia Padilla or Cynthia Padilla to call  Her     Please call

## 2019-08-20 NOTE — Telephone Encounter (Signed)
Fay Records, MD  08/19/2019 9:59 PM EST    Aorta on this exam appears normal   There is a very small (5 mm) peripheral R upper lobe lung that was not in field of view on last CT (that part of lung not imaged) Review of previous CT ([facility]) peripheral nodules were commented on in report (were stable at that time so had a couple scans (Feb and Sept) Based on this I would not recomm follow up    I spoke with patient and reviewed results with her. Will also send comments from Dr Harrington Challenger to patient through my chart.  She is concerned about pulmonary nodule. She reports it was 4 mm in 2013. Pt is asking if increase to 5 mm is worrisome and if additional follow up is needed.

## 2019-08-26 DIAGNOSIS — Z01419 Encounter for gynecological examination (general) (routine) without abnormal findings: Secondary | ICD-10-CM | POA: Diagnosis not present

## 2019-09-02 DIAGNOSIS — R69 Illness, unspecified: Secondary | ICD-10-CM | POA: Diagnosis not present

## 2019-09-06 DIAGNOSIS — D171 Benign lipomatous neoplasm of skin and subcutaneous tissue of trunk: Secondary | ICD-10-CM | POA: Insufficient documentation

## 2019-09-24 DIAGNOSIS — H6123 Impacted cerumen, bilateral: Secondary | ICD-10-CM | POA: Diagnosis not present

## 2019-10-24 DIAGNOSIS — M1612 Unilateral primary osteoarthritis, left hip: Secondary | ICD-10-CM | POA: Diagnosis not present

## 2019-10-24 DIAGNOSIS — M25552 Pain in left hip: Secondary | ICD-10-CM | POA: Diagnosis not present

## 2019-10-28 DIAGNOSIS — M25552 Pain in left hip: Secondary | ICD-10-CM | POA: Insufficient documentation

## 2019-11-02 DIAGNOSIS — M25552 Pain in left hip: Secondary | ICD-10-CM | POA: Diagnosis not present

## 2019-11-11 ENCOUNTER — Other Ambulatory Visit: Payer: Medicare HMO | Admitting: *Deleted

## 2019-11-11 ENCOUNTER — Other Ambulatory Visit: Payer: Self-pay

## 2019-11-11 DIAGNOSIS — E782 Mixed hyperlipidemia: Secondary | ICD-10-CM

## 2019-11-11 LAB — LIPID PANEL
Chol/HDL Ratio: 3.2 ratio (ref 0.0–4.4)
Cholesterol, Total: 189 mg/dL (ref 100–199)
HDL: 59 mg/dL (ref >39)
LDL Chol Calc (NIH): 113 mg/dL — ABNORMAL HIGH (ref 0–99)
Triglycerides: 95 mg/dL (ref 0–149)
VLDL Cholesterol Cal: 17 mg/dL (ref 5–40)

## 2019-11-11 LAB — HEPATIC FUNCTION PANEL
ALT: 12 IU/L (ref 0–32)
AST: 19 IU/L (ref 0–40)
Albumin: 4.6 g/dL (ref 3.8–4.8)
Alkaline Phosphatase: 75 IU/L (ref 39–117)
Bilirubin Total: 0.5 mg/dL (ref 0.0–1.2)
Bilirubin, Direct: 0.15 mg/dL (ref 0.00–0.40)
Total Protein: 6.9 g/dL (ref 6.0–8.5)

## 2019-11-12 ENCOUNTER — Telehealth: Payer: Self-pay | Admitting: Pharmacist

## 2019-11-12 DIAGNOSIS — E782 Mixed hyperlipidemia: Secondary | ICD-10-CM

## 2019-11-12 MED ORDER — ROSUVASTATIN CALCIUM 5 MG PO TABS: 2.5000 mg | ORAL_TABLET | Freq: Every day | ORAL | 11 refills | Status: DC

## 2019-11-12 NOTE — Telephone Encounter (Signed)
Pt returned call to clinic. She states she could not tolerate the rosuvastatin 5mg . She tried taking it for 6 weeks total and "felt awful." For the last month, she has lost 9 lbs and is trying to eat better and is going to pilates twice a week. She is using the Noom diet and is limiting her calories to 1200 per day.  She states she tolerated the 2.5mg  of rosuvastatin better and is willing to restart this. Discussed that this dosing in combination with lifestyle improvements should bring her LDL closer to goal < 70. Also discussed adding Zetia if needed which she would like to hold off on for now. Will f/u with lipids in 3 months and reassess.

## 2019-11-12 NOTE — Telephone Encounter (Signed)
Left message to discuss lipid panel results.  Lipid panel in 06/2019 showed LDL of 102 on rosuvastatin 2.5mg  daily. She was advised at last lipid visit appt 08/19/19 to increase her dose to rosuvastatin 5mg  daily. LDL increased on lipid panel from yesterday to 113 so I would anticipate that pt has either decreased her rosuvastatin dose/frequency or has stopped taking.  Will need to discuss potential side effects when pt returns call. Can consider starting Zetia or try pravastatin (never started due to fear over rise in blood sugar - pravastatin is net neutral on glucose).

## 2019-11-20 ENCOUNTER — Other Ambulatory Visit: Payer: Self-pay | Admitting: Obstetrics and Gynecology

## 2019-11-20 DIAGNOSIS — N644 Mastodynia: Secondary | ICD-10-CM

## 2019-12-11 DIAGNOSIS — H33301 Unspecified retinal break, right eye: Secondary | ICD-10-CM | POA: Diagnosis not present

## 2019-12-11 DIAGNOSIS — H52203 Unspecified astigmatism, bilateral: Secondary | ICD-10-CM | POA: Diagnosis not present

## 2019-12-11 DIAGNOSIS — H2513 Age-related nuclear cataract, bilateral: Secondary | ICD-10-CM | POA: Diagnosis not present

## 2019-12-11 DIAGNOSIS — H5203 Hypermetropia, bilateral: Secondary | ICD-10-CM | POA: Diagnosis not present

## 2019-12-12 ENCOUNTER — Ambulatory Visit
Admission: RE | Admit: 2019-12-12 | Discharge: 2019-12-12 | Disposition: A | Discharge status: Home / Self Care | Payer: Medicare HMO | Source: Ambulatory Visit | Attending: Obstetrics and Gynecology | Admitting: Obstetrics and Gynecology

## 2019-12-12 ENCOUNTER — Ambulatory Visit: Payer: Medicare HMO

## 2019-12-12 ENCOUNTER — Other Ambulatory Visit: Payer: Self-pay

## 2019-12-12 DIAGNOSIS — N644 Mastodynia: Secondary | ICD-10-CM

## 2019-12-12 IMAGING — MG DIGITAL DIAGNOSTIC BILAT W/ TOMO W/ CAD
6 of 12 series · 6 of 36 positions shown · non-contrast
Comparison: Previous exam(s).

CLINICAL DATA: Patient presents for bilateral diagnostic
examination due to bilateral nipple tenderness. No palpable
abnormality and no history of nipple discharge. Patient states
resolution of the right nipple tenderness after taking vitamin E.
Patient states the left nipple tenderness resolved but has come
back. Patient states no nipple inversion/retraction and no red scaly
rash of the nipples.

EXAM:
DIGITAL DIAGNOSTIC BILATERAL MAMMOGRAM WITH CAD AND TOMO

[R MLO synth-2D (1 of 2)]
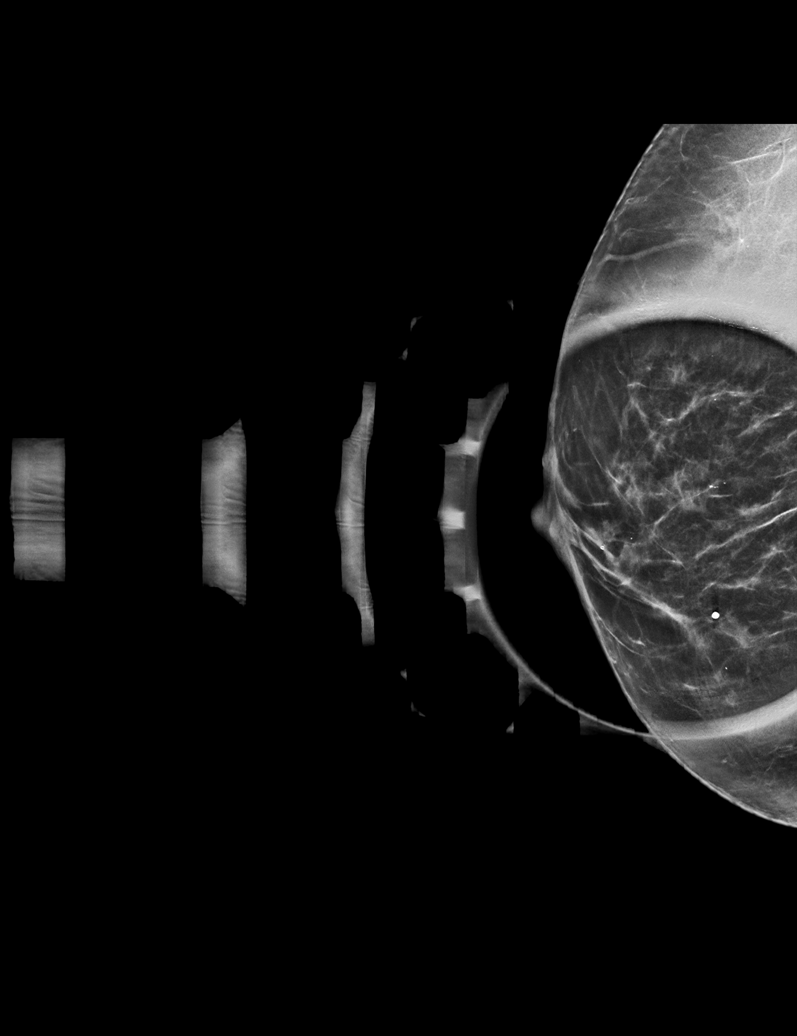

[R CC synth-2D]
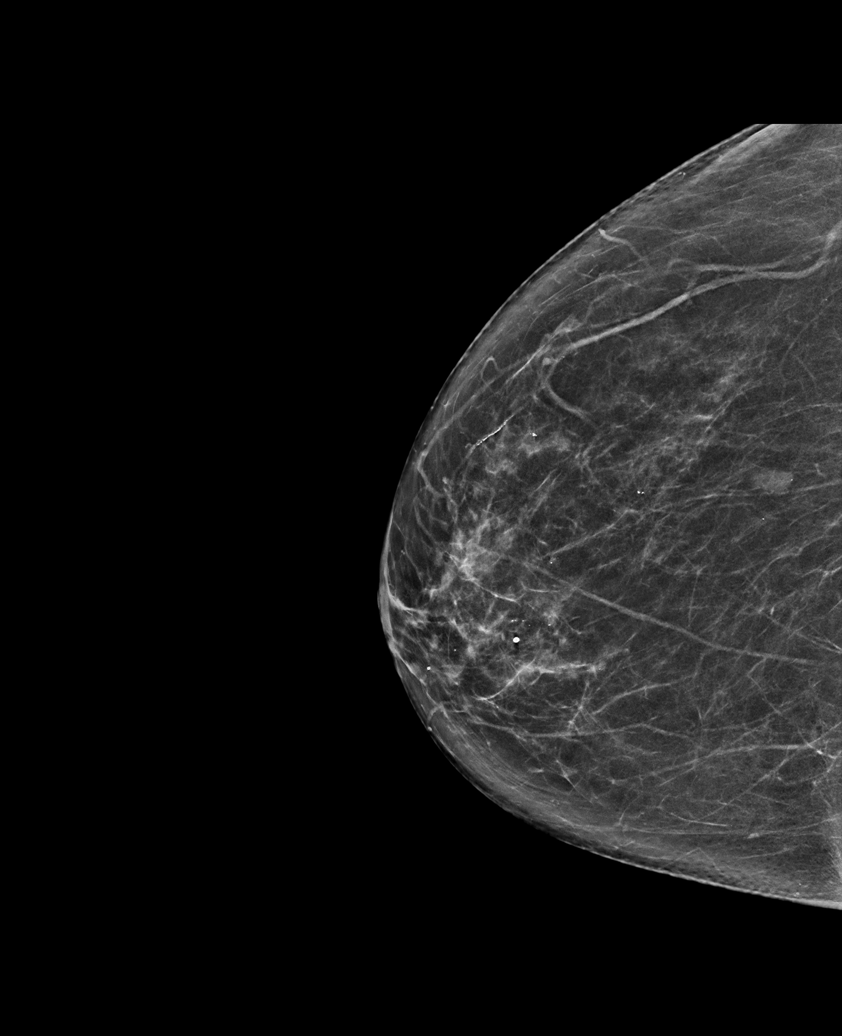

[L CC synth-2D]
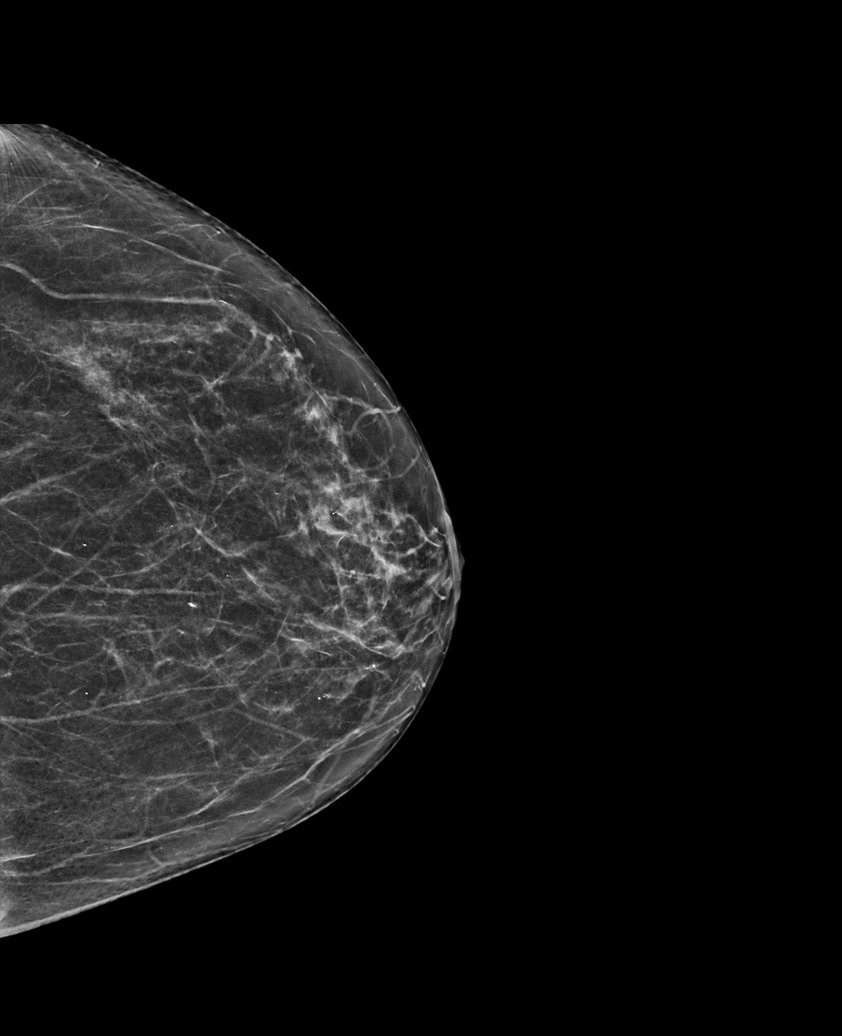

[R MLO synth-2D (2 of 2)]
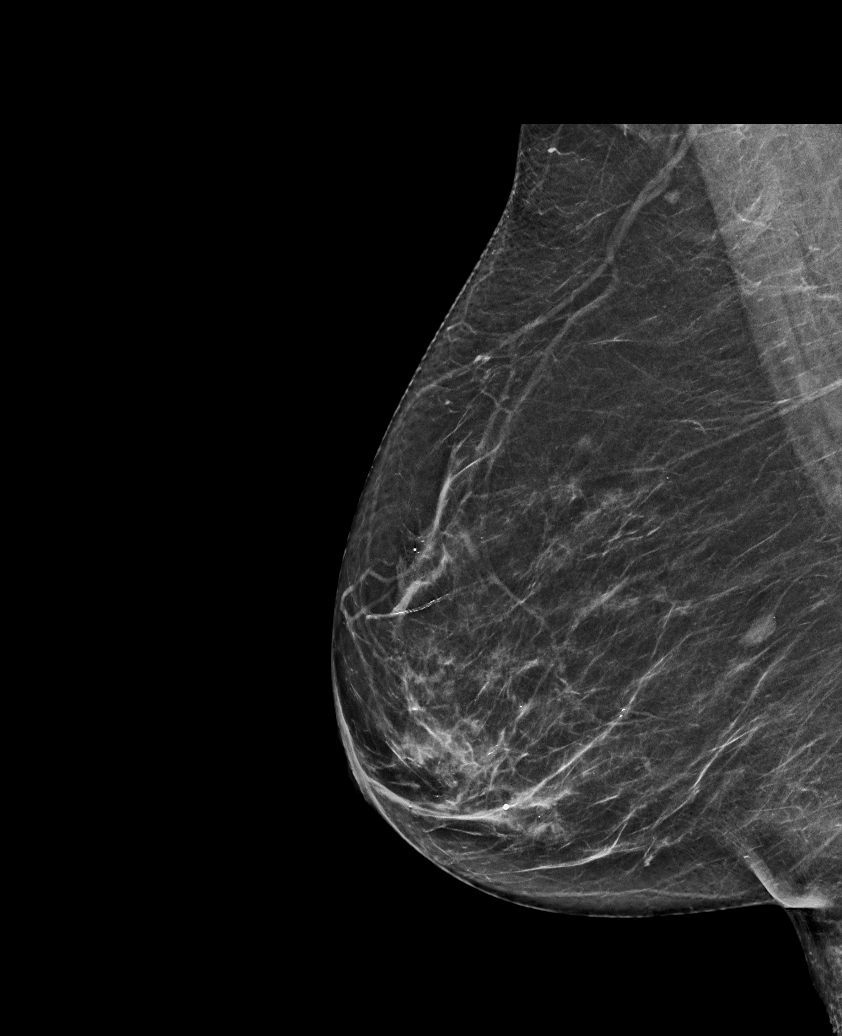

[L MLO synth-2D (1 of 2)]
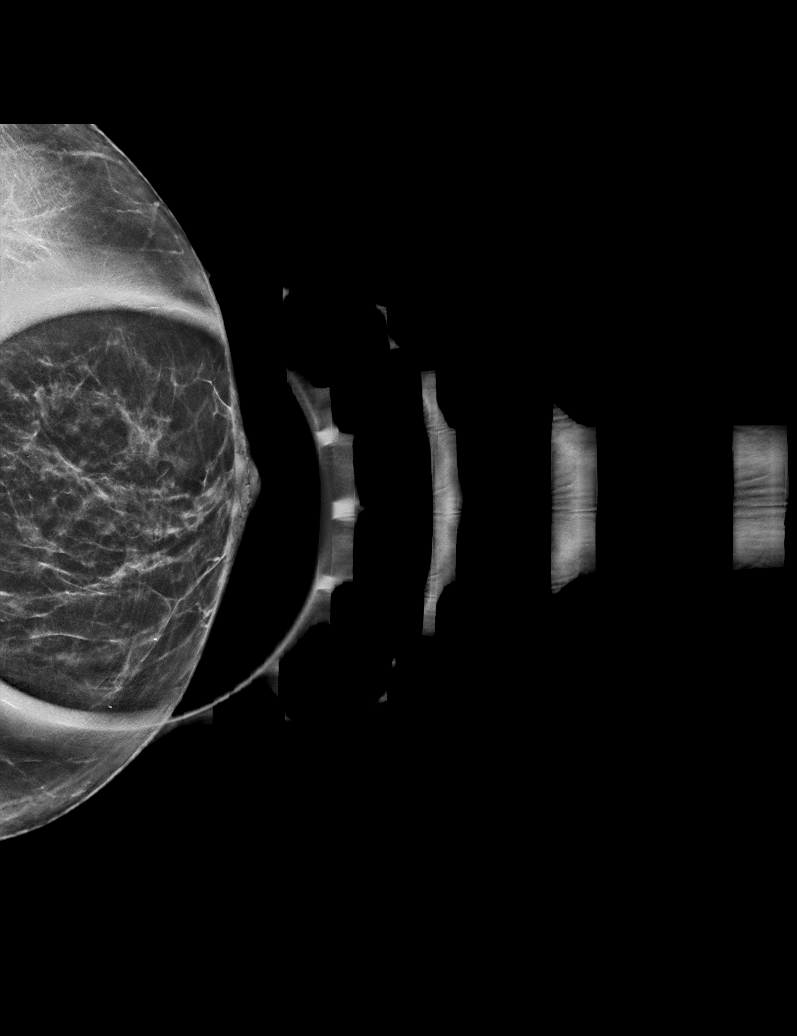

[L MLO synth-2D (2 of 2)]
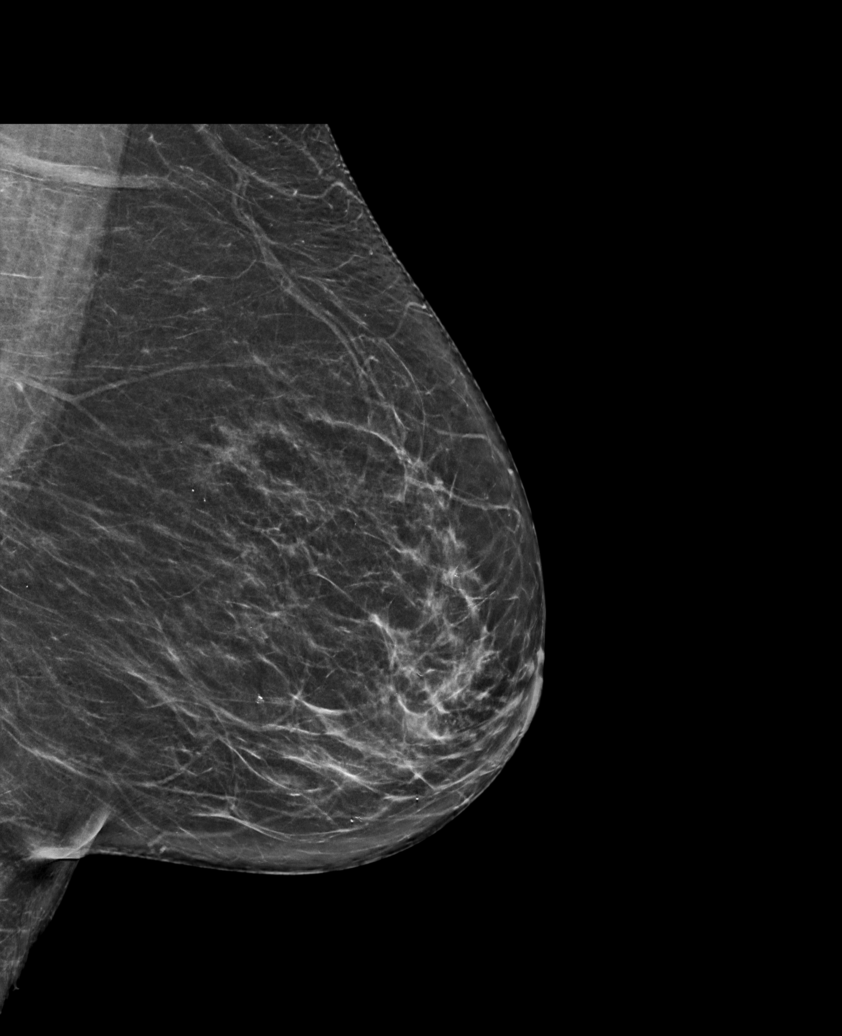

[6 of 36 positions shown; findings below may reference images not displayed]

ACR Breast Density Category b: There are scattered areas of
fibroglandular density.
FINDINGS: Examination demonstrates no focal abnormality over the region of the
nipple areolar complex bilaterally to explain patient's tenderness.
Remainder of the right breast as well as the left breast is
unchanged.

Mammographic images were processed with CAD.
IMPRESSION: Stable mammogram. No focal abnormality to explain patient's
bilateral nipple tenderness.

RECOMMENDATION:
Recommend continued management of patient's bilateral nipple
tenderness on a clinical basis. Otherwise, recommend continued
annual bilateral screening mammographic follow-up.

I have discussed the findings and recommendations with the patient.
If applicable, a reminder letter will be sent to the patient
regarding the next appointment.

BI-RADS CATEGORY  1: Negative.

## 2019-12-20 DIAGNOSIS — R05 Cough: Secondary | ICD-10-CM | POA: Diagnosis not present

## 2019-12-20 DIAGNOSIS — Z7189 Other specified counseling: Secondary | ICD-10-CM | POA: Insufficient documentation

## 2019-12-20 DIAGNOSIS — I10 Essential (primary) hypertension: Secondary | ICD-10-CM | POA: Diagnosis not present

## 2019-12-20 DIAGNOSIS — J45998 Other asthma: Secondary | ICD-10-CM | POA: Diagnosis not present

## 2019-12-20 DIAGNOSIS — J302 Other seasonal allergic rhinitis: Secondary | ICD-10-CM | POA: Diagnosis not present

## 2019-12-20 DIAGNOSIS — Z1152 Encounter for screening for COVID-19: Secondary | ICD-10-CM | POA: Diagnosis not present

## 2020-02-12 DIAGNOSIS — Z85828 Personal history of other malignant neoplasm of skin: Secondary | ICD-10-CM | POA: Diagnosis not present

## 2020-02-12 DIAGNOSIS — L821 Other seborrheic keratosis: Secondary | ICD-10-CM | POA: Diagnosis not present

## 2020-02-12 DIAGNOSIS — D1801 Hemangioma of skin and subcutaneous tissue: Secondary | ICD-10-CM | POA: Diagnosis not present

## 2020-02-12 DIAGNOSIS — L814 Other melanin hyperpigmentation: Secondary | ICD-10-CM | POA: Diagnosis not present

## 2020-02-17 ENCOUNTER — Other Ambulatory Visit: Payer: Self-pay

## 2020-02-17 ENCOUNTER — Other Ambulatory Visit: Payer: Medicare HMO | Admitting: *Deleted

## 2020-02-17 DIAGNOSIS — E782 Mixed hyperlipidemia: Secondary | ICD-10-CM

## 2020-02-17 LAB — LIPID PANEL
Chol/HDL Ratio: 3.4 ratio (ref 0.0–4.4)
Cholesterol, Total: 203 mg/dL — ABNORMAL HIGH (ref 100–199)
HDL: 60 mg/dL (ref >39)
LDL Chol Calc (NIH): 127 mg/dL — ABNORMAL HIGH (ref 0–99)
Triglycerides: 88 mg/dL (ref 0–149)
VLDL Cholesterol Cal: 16 mg/dL (ref 5–40)

## 2020-02-18 ENCOUNTER — Telehealth: Payer: Self-pay | Admitting: Pharmacist

## 2020-02-18 MED ORDER — PRAVASTATIN SODIUM 20 MG PO TABS: ORAL_TABLET | ORAL | 11 refills | Status: DC

## 2020-02-18 NOTE — Telephone Encounter (Signed)
Called pt to discuss lipid results. LDL continues to trend up, now at 127 above aggressive goal < 70 due to elevated calcium score and family history of CAD.  Pt states she took the lower dose of rosuvastatin 2.5mg  daily for 2 weeks before stopping due to feeling "awful and in a fog" as well as arm pain. She is frustrated by her results since she has lost 17 lbs and has been watching what she eats, as well as doing pilates.   Pt is agreeable to try pravastatin 20mg  3 days a week and increase to daily dosing if tolerated. Encouraged her to call clinic with any trouble tolerating therapy and we can try ezetimibe next if needed. Will call pt in 1 month to assess tolerability if I have not heard from her by then.

## 2020-03-17 ENCOUNTER — Telehealth: Payer: Self-pay | Admitting: Pharmacist

## 2020-03-17 DIAGNOSIS — E782 Mixed hyperlipidemia: Secondary | ICD-10-CM

## 2020-03-17 MED ORDER — PRAVASTATIN SODIUM 20 MG PO TABS: 20.0000 mg | ORAL_TABLET | Freq: Every evening | ORAL | 11 refills | Status: DC

## 2020-03-17 NOTE — Telephone Encounter (Signed)
Called pt to follow up with pravastatin tolerability. Pt reports taking pravastatin 20mg  every day. She has not noticed any muscle cramps, weakness, or brain fog like she experienced on the rosuvastatin. She has been having headaches although this is not typically a side effect associated with statins frequently. She is willing to continue on the pravastatin every day. Scheduled follow up labs in 1 month to assess efficacy.

## 2020-03-20 DIAGNOSIS — R05 Cough: Secondary | ICD-10-CM | POA: Diagnosis not present

## 2020-03-20 DIAGNOSIS — J40 Bronchitis, not specified as acute or chronic: Secondary | ICD-10-CM | POA: Diagnosis not present

## 2020-04-20 ENCOUNTER — Other Ambulatory Visit: Payer: Self-pay

## 2020-04-20 ENCOUNTER — Other Ambulatory Visit: Payer: Medicare HMO | Admitting: *Deleted

## 2020-04-20 DIAGNOSIS — E782 Mixed hyperlipidemia: Secondary | ICD-10-CM | POA: Diagnosis not present

## 2020-04-20 LAB — HEPATIC FUNCTION PANEL
ALT: 15 IU/L (ref 0–32)
AST: 20 IU/L (ref 0–40)
Albumin: 4.5 g/dL (ref 3.7–4.7)
Alkaline Phosphatase: 71 IU/L (ref 44–121)
Bilirubin Total: 0.4 mg/dL (ref 0.0–1.2)
Bilirubin, Direct: 0.13 mg/dL (ref 0.00–0.40)
Total Protein: 6.6 g/dL (ref 6.0–8.5)

## 2020-04-20 LAB — LIPID PANEL
Chol/HDL Ratio: 3 ratio (ref 0.0–4.4)
Cholesterol, Total: 173 mg/dL (ref 100–199)
HDL: 58 mg/dL (ref >39)
LDL Chol Calc (NIH): 100 mg/dL — ABNORMAL HIGH (ref 0–99)
Triglycerides: 80 mg/dL (ref 0–149)
VLDL Cholesterol Cal: 15 mg/dL (ref 5–40)

## 2020-04-21 DIAGNOSIS — R05 Cough: Secondary | ICD-10-CM | POA: Diagnosis not present

## 2020-04-21 DIAGNOSIS — J309 Allergic rhinitis, unspecified: Secondary | ICD-10-CM | POA: Diagnosis not present

## 2020-04-21 DIAGNOSIS — Z1152 Encounter for screening for COVID-19: Secondary | ICD-10-CM | POA: Diagnosis not present

## 2020-04-21 DIAGNOSIS — J45998 Other asthma: Secondary | ICD-10-CM | POA: Diagnosis not present

## 2020-05-07 DIAGNOSIS — J069 Acute upper respiratory infection, unspecified: Secondary | ICD-10-CM | POA: Diagnosis not present

## 2020-05-07 DIAGNOSIS — Z20822 Contact with and (suspected) exposure to covid-19: Secondary | ICD-10-CM | POA: Diagnosis not present

## 2020-05-07 DIAGNOSIS — J029 Acute pharyngitis, unspecified: Secondary | ICD-10-CM | POA: Diagnosis not present

## 2020-05-07 DIAGNOSIS — Z20828 Contact with and (suspected) exposure to other viral communicable diseases: Secondary | ICD-10-CM | POA: Diagnosis not present

## 2020-05-08 ENCOUNTER — Ambulatory Visit: Payer: Medicare HMO | Admitting: Internal Medicine

## 2020-05-20 ENCOUNTER — Encounter: Payer: Self-pay | Admitting: Internal Medicine

## 2020-05-20 ENCOUNTER — Ambulatory Visit: Payer: Medicare HMO | Admitting: Internal Medicine

## 2020-05-20 ENCOUNTER — Other Ambulatory Visit: Payer: Self-pay

## 2020-05-20 VITALS — BP 110/64 | HR 71 | Ht 67.0 in | Wt 191.2 lb

## 2020-05-20 DIAGNOSIS — R062 Wheezing: Secondary | ICD-10-CM | POA: Diagnosis not present

## 2020-05-20 DIAGNOSIS — R059 Cough, unspecified: Secondary | ICD-10-CM | POA: Diagnosis not present

## 2020-05-20 DIAGNOSIS — Z7185 Encounter for immunization safety counseling: Secondary | ICD-10-CM

## 2020-05-20 DIAGNOSIS — R911 Solitary pulmonary nodule: Secondary | ICD-10-CM

## 2020-05-20 NOTE — Patient Instructions (Addendum)
ICD-10-CM   1. Cough  R05.9   2. Wheezing  R06.2   3. Vaccine counseling  Z71.85   4. Nodule of upper lobe of right lung  R91.1    Cough/Wheezing  - let us try to rule out asthma -another possibility is irritable larynx syndrome  Plan  - do cbc with diff, blood IgE and RAST allergy profile  - do full PFT - do FeNO / exhaled Nitric Oxide test - will hold off on CT chest/CXR for the moment   Vaccine counseling  Plan - you are more than likely to be able to handle covid vaccine   - Pfizer or Moderna recommended   56mm Nodule RUL - seen in CT angio jan 2021   - per your history and Dr Dorris Carnes review these are old and no follwup indicated  Plan - will hold off on CT chest for the moment  Followup  - return next few to several weeks to discuss test results and next step

## 2020-05-20 NOTE — Progress Notes (Signed)
OV 05/20/2020  Subjective:  Patient ID: Cynthia Padilla, female , DOB: [birth year] , age 69 y.o. , MRN: [id number] , ADDRESS: [place] [address] PCP Velna Hatchet, MD Patient Care Team: Velna Hatchet, MD as PCP - General (Internal Medicine) Fay Records, MD as PCP - Cardiology (Cardiology)  This Provider for this visit: Treatment Team:  Attending Provider: Brand Males, MD    05/20/2020 -   Chief Complaint  Patient presents with  . Consult    Pt is here due to a persistent cough she has had since 8 months. Pt also has had an occ sore throat and sinus problems also with wheezing which has subsided.     HPI Cynthia Padilla 69 y.o. 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  She is the wife of my patient Cynthia Padilla who suffers from asthma and who is on interleukin-5 receptor blockade.  I have known [name] through her visits accompanying her husband.  In this particular visit she presents as the patient herself.  She tells me that she was at baseline health up until March 2021.  She started having dry cough.  Along with this during acceleration there was some wheezing.  She saw primary care physician who gave her prednisone Breo and Tessalon Perles.  Then within a few weeks things resolved.  Then she came off Breo.  I believe the prednisone initially was for 10 days.  Then again in May same thing happened.  She took another prednisone for 10 days.  This time took Symbicort 1 puff twice daily along with albuterol.  There was some rattling and mucus.  Apparently chest x-ray was negative.  She did go to an urgent care when the chest x-ray was done.  She was given Decadron.  After that she got better and then was doing completely well till September 2021.  In the gap between May 2021 and September 2021 she is not taking any Symbicort or any inhaled steroid.  Then in September 2021 the same thing happen and around mid September she took prednisone for  10 days.  Then by end of September same thing recurred.  This time was a little bit different started as a sore throat and then went up the sinuses Covid testing x2 was negative.  She says she has some cough wheezing nocturnal symptoms cough is mostly dry but sometimes she has mucus production.  This time she is not taking prednisone but she is doing Symbicort.  She feels after a few weeks things are improving.  At baseline she has hypertension she is not on ACE inhibitor she is on hydrochlorothiazide.  She has acid reflux but not symptomatic anymore.  In the past she took PPI for many years and then lost weight and followed a healthy diet and got rid of acid reflux.  Her last prednisone was in September 2021.  Of note she is concerned about the discoloration in her tongue.  She describes this is black coating.  On my exam it was gray-green.  She admitted to using cough lozenges of a similar color for the last 2 weeks and the discoloration present only for the last 1 week.  Her RSI cough score as recently experienced is as below.    Dr Lorenza Cambridge Reflux Symptom Index (> 13-15 suggestive of LPR cough) 0 -> 5  =  none ->severe problem  Hoarseness of problem with voice 4  Clearing  Of Throat 3  Excess throat  mucus or feeling of post nasal drip 3  Difficulty swallowing food, liquid or tablets 0  Cough after eating or lying down 1  Breathing difficulties or choking episodes 3.5  Troublesome or annoying cough 5  Sensation of something sticking in throat or lump in throat 0  Heartburn, chest pain, indigestion, or stomach acid coming up 1  TOTAL 20.5      Of note her last lung imaging was in January 2021 when she had CT angiogram chest for slightly enlarged aorta.  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  She follows with Dr. Dorris Carnes for this.  On the lung images the mediastinum was felt to be normal and the pulmonary parenchyma is felt to be normal.  She does have a 5 mm  peripheral right upper lobe nodule.  It appears that Dr. Harrington Challenger reviewed the CT scan along with old ones from a previous health system.  And she has had these nodules for some years and it is stable.  [name]  She has not been a smoker.   Other issue is that she is not vaccinated herself against COVID.  She normally gets vaccines.  But apparently a few years ago teach took the shingles vaccine [it was not Shingrix but the zoster] and she had right arm swelling.  Therefore she is can of petrified.  But recently she not had issues with flu shot or other vaccines in terms of major allergic reactions.  We discussed the overall incidence of anaphylaxis and that the risk is being 2% but the risk is lower in patientts in  patients were generated to have tolerated vaccines without any allergic reaction.  We discussed the 3 options.  Recommended anecdotally Pfizer over the other 2.  ROS - per HPI  Results for [name], [name] (MRN [id number]) as of 05/20/2020 15:55  Ref. Range 07/19/2011 08:32 03/28/2012 09:22 03/27/2013 08:47 01/29/2014 08:13 03/06/2014 12:13  Eosinophils Absolute Latest Ref Range: 0.0 - 0.7 K/uL 0.2 0.1 0.1 0.1 0.1   CT angiogram chest August 19, 2019 -.IMPRESSION: 1. No evidence for thoracic aortic aneurysm on this contrast enhanced study. The ascending thoracic aorta measures approximately 3.8 cm on this examination. 2. There is a 5 mm pulmonary nodule in the peripheral right upper lobe. No follow-up needed if patient is low-risk. Non-contrast chest CT can be considered in 12 months if patient is high-risk. This recommendation follows the consensus statement: Guidelines for Management of Incidental Pulmonary Nodules Detected on CT Images: From the Fleischner Society 2017; Radiology 2017; 284:228-243.   Electronically Signed   By: Constance Holster M.D.   On: 08/19/2019 16:33     has a past medical history of Anxiety,  Arthritis, Asthma, Diverticulitis, DIVERTICULOSIS, COLON (11/04/2004), GERD (gastroesophageal reflux disease), Hepatitis, History of colonic polyps, Hyperlipidemia, Hypertension, Internal hemorrhoids, PONV (postoperative nausea and vomiting), and Vertigo.   reports that she has never smoked. She has never used smokeless tobacco.  Past Surgical History:  Procedure Laterality Date  . BREAST BIOPSY Right   . ELBOW FRACTURE SURGERY Left   . HERNIA REPAIR     pt denies  . LAMINECTOMY  10/11/11   with fusion @ L4-5  . RETINAL TEAR REPAIR CRYOTHERAPY Right 2015  . TUBAL LIGATION      Allergies  Allergen Reactions  . Atorvastatin Other (See Comments)    myalgia  . Rosuvastatin     Myalgias on  2.5mg  and 5mg  dose    Immunization History  Administered Date(s) Administered  . Influenza Split 05/13/2013  . Influenza, High Dose Seasonal PF 06/07/2017  . Influenza-Unspecified 06/08/2014, 05/11/2015, 05/08/2016, 08/08/2018  . Pneumococcal Conjugate-13 04/27/2015  . Pneumococcal Polysaccharide-23 08/08/2010  . Td 01/07/2003  . Tdap 02/04/2014  . Zoster 03/30/2010, 08/08/2010    Family History  Problem Relation Age of Onset  . Ovarian cancer Mother   . Lung cancer Mother   . Diabetes Father   . Heart disease Father   . Leukemia Father   . Diabetes Sister        x 2  . Rectal cancer Sister        anal  . Diabetes Brother        x 3  . Colon cancer Neg Hx   . Throat cancer Neg Hx   . Kidney disease Neg Hx   . Anesthesia problems Neg Hx   . Liver disease Neg Hx   . Breast cancer Neg Hx      Current Outpatient Medications:  .  acetaminophen (TYLENOL) 500 MG tablet, Take 1,000 mg by mouth daily as needed for headache., Disp: , Rfl:  .  albuterol (VENTOLIN HFA) 108 (90 Base) MCG/ACT inhaler, Inhale into the lungs., Disp: , Rfl:  .  benzonatate (TESSALON) 200 MG capsule, Take 200 mg by mouth 3 (three) times daily as needed., Disp: , Rfl:  .  Cholecalciferol 25 MCG (1000 UT) capsule,  Vitamin D3 25 mcg (1,000 unit) capsule  Take by oral route., Disp: , Rfl:  .  Coenzyme Q10 (COQ10) 150 MG CAPS, 150 mg., Disp: , Rfl:  .  hydrochlorothiazide (HYDRODIURIL) 25 MG tablet, Take 25 mg by mouth daily. , Disp: , Rfl:  .  meclizine (ANTIVERT) 25 MG tablet, Take 1 tablet (25 mg total) by mouth 3 (three) times daily as needed., Disp: 30 tablet, Rfl: 0 .  Polyethyl Glycol-Propyl Glycol (SYSTANE OP), Place 1 drop into both eyes daily as needed (dry eyes, allergies)., Disp: , Rfl:  .  pravastatin (PRAVACHOL) 20 MG tablet, Take 1 tablet (20 mg total) by mouth every evening. Take 1 tablet up to once daily in the evening as tolerated., Disp: 30 tablet, Rfl: 11 .  SYMBICORT 80-4.5 MCG/ACT inhaler, 1 puff 2 (two) times daily., Disp: , Rfl:   Current Facility-Administered Medications:  .  0.9 %  sodium chloride infusion, 500 mL, Intravenous, Continuous, Irene Shipper, MD      Objective:   Vitals:   05/20/20 1531  BP: 110/64  Pulse: 71  SpO2: 96%  Weight: 191 lb 3.2 oz (86.7 kg)  Height: 5\' 7"  (1.702 m)    Estimated body mass index is 29.95 kg/m as calculated from the following:   Height as of this encounter: 5\' 7"  (1.702 m).   Weight as of this encounter: 191 lb 3.2 oz (86.7 kg).  @WEIGHTCHANGE @  [employer]   05/20/20 1531  Weight: 191 lb 3.2 oz (86.7 kg)     Physical Exam  General Appearance:    Alert, cooperative, no distress, appears stated age - tes , Deconditioned looking - no , OBESE  - no, Sitting on Wheelchair -  no  Head:    Normocephalic, without obvious abnormality, atraumatic  Eyes:    PERRL, conjunctiva/corneas clear,  Ears:    Normal TM's and external ear canals, both ears  Nose:   Nares normal, septum midline, mucosa normal, no drainage    or sinus tenderness.  OXYGEN ON  - no . Patient is @ ra   Throat:   Lips, mucosa, and tongue normal; teeth and gums normal. Cyanosis on lips - no  Neck:   Supple, symmetrical, trachea midline, no adenopathy;    thyroid:   no enlargement/tenderness/nodules; no carotid   bruit or JVD  Back:     Symmetric, no curvature, ROM normal, no CVA tenderness  Lungs:     Distress - no , Wheeze no, Barrell Chest - no, Purse lip breathing - no, Crackles - ?   Chest Wall:    No tenderness or deformity.    Heart:    Regular rate and rhythm, S1 and S2 normal, no rub   or gallop, Murmur - no  Breast Exam:    NOT DONE  Abdomen:     Soft, non-tender, bowel sounds active all four quadrants,    no masses, no organomegaly. Visceral obesity - no  Genitalia:   NOT DONE  Rectal:   NOT DONE  Extremities:   Extremities - normal, Has Cane - no, Clubbing - no, Edema - no  Pulses:   2+ and symmetric all extremities  Skin:   Stigmata of Connective Tissue Disease - no  Lymph nodes:   Cervical, supraclavicular, and axillary nodes normal  Psychiatric:  Neurologic:   Pleasant - yes, Anxious - no, Flat affect - no  CAm-ICU - neg, Alert and Oriented x 3 - yes, Moves all 4s - yes, Speech - normal, Cognition - intact         Assessment:       ICD-10-CM   1. Cough  R05.9 CBC w/Diff    IgE    Resp Allergy Profile Regn2DC DE MD [facility]    Pulmonary function test    Nitric oxide  2. Wheezing  R06.2   3. Vaccine counseling  Z71.85   4. Nodule of upper lobe of right lung  R91.1        Plan:     Patient Instructions     ICD-10-CM   1. Cough  R05.9   2. Wheezing  R06.2   3. Vaccine counseling  Z71.85   4. Nodule of upper lobe of right lung  R91.1    Cough/Wheezing  - let us try to rule out asthma  Plan  - do cbc with diff, blood IgE and RAST allergy profile  - do full PFT - do FeNO / exhaled Nitric Oxide test - will hold off on CT chest/CXR for the moment   Vaccine counseling  Plan - you are more than likely to be able to handle covid vaccine   - Pfizer or Moderna recommended   15mm Nodule RUL - seen in CT angio jan 2021   - per your history and Dr Dorris Carnes review these are old and no follwup indicated  Plan -  will hold off on CT chest for the moment  Followup  - return next few to several weeks to discuss test results and next step   ( Level 05 visit:  New 60-74 min   in  visit type: on-site physical face to visit  in total care time and counseling or/and coordination of care by this undersigned MD - Dr Brand Males. This includes one or more of the following on this same day 05/20/2020: pre-charting, chart review, note writing, documentation discussion of test results, diagnostic or treatment recommendations, prognosis, risks and benefits of management options, instructions, education, compliance or risk-factor reduction. It excludes  time spent by the [place] or office staff in the care of the patient. Actual time 69 min)   SIGNATURE    Dr. Brand Males, M.D., F.C.C.P,  Pulmonary and Critical Care Medicine Staff Physician, [facility] Director - Interstitial Lung Disease  Program  Pulmonary [place] [facility] [place], [place], [address]  Pager: [phone], If no answer or between  15:00h - 7:00h: call [phone] Telephone: [phone]  4:28 PM 05/20/2020

## 2020-05-21 LAB — CBC WITH DIFFERENTIAL/PLATELET
Basophils Absolute: 0.1 10*3/uL (ref 0.0–0.1)
Basophils Relative: 0.8 % (ref 0.0–3.0)
Eosinophils Absolute: 0.5 10*3/uL (ref 0.0–0.7)
Eosinophils Relative: 5.6 % — ABNORMAL HIGH (ref 0.0–5.0)
HCT: 39 % (ref 36.0–46.0)
Hemoglobin: 13.2 g/dL (ref 12.0–15.0)
Lymphocytes Relative: 36.5 % (ref 12.0–46.0)
Lymphs Abs: 3.5 10*3/uL (ref 0.7–4.0)
MCHC: 34 g/dL (ref 30.0–36.0)
MCV: 93.7 fl (ref 78.0–100.0)
Monocytes Absolute: 0.6 10*3/uL (ref 0.1–1.0)
Monocytes Relative: 6.4 % (ref 3.0–12.0)
Neutro Abs: 4.8 10*3/uL (ref 1.4–7.7)
Neutrophils Relative %: 50.7 % (ref 43.0–77.0)
Platelets: 295 10*3/uL (ref 150.0–400.0)
RBC: 4.16 Mil/uL (ref 3.87–5.11)
RDW: 12.4 % (ref 11.5–15.5)
WBC: 9.5 10*3/uL (ref 4.0–10.5)

## 2020-05-21 LAB — RESPIRATORY ALLERGY PROFILE REGION II ~~LOC~~

## 2020-05-21 LAB — INTERPRETATION:

## 2020-06-05 DIAGNOSIS — Z20828 Contact with and (suspected) exposure to other viral communicable diseases: Secondary | ICD-10-CM | POA: Diagnosis not present

## 2020-06-10 DIAGNOSIS — R69 Illness, unspecified: Secondary | ICD-10-CM | POA: Diagnosis not present

## 2020-07-05 NOTE — Progress Notes (Addendum)
Cardiology Office Note   Date:  07/06/2020   ID:  Cynthia Padilla, DOB 07/25/1951, MRN [id number]  PCP:  Velna Hatchet, MD  Cardiologist:   Dorris Carnes, MD    Pt presents for f/u of CAD and HL    History of Present Illness: Cynthia Padilla is a 69 y.o. female with a history of chest pain   I saw her in 2017  Pain was atypical for coronary ischemia    Given strong FHx of CAD I recomm a calcium score CT  She had this in Jan 2020 Ca score was 51.9   The aorta was noted to be 41 mm  Atherosclerosis of aorta noted  I last saw her in clinic in Sept 2020  Since seen she denies CP   She is on Symbicort and is due to have lung function tests soon   BP at home 110s to 140s (Higher readings at night)/70 to 90      Current Outpatient Medications  Medication Sig Dispense Refill  . acetaminophen (TYLENOL) 500 MG tablet Take 1,000 mg by mouth daily as needed for headache.    . albuterol (VENTOLIN HFA) 108 (90 Base) MCG/ACT inhaler Inhale into the lungs.    . benzonatate (TESSALON) 200 MG capsule Take 200 mg by mouth 3 (three) times daily as needed.    . Cholecalciferol 25 MCG (1000 UT) capsule Vitamin D3 25 mcg (1,000 unit) capsule  Take by oral route.    . Coenzyme Q10 (COQ10) 150 MG CAPS 150 mg.    . hydrochlorothiazide (HYDRODIURIL) 25 MG tablet Take 25 mg by mouth daily.     . meclizine (ANTIVERT) 25 MG tablet Take 1 tablet (25 mg total) by mouth 3 (three) times daily as needed. 30 tablet 0  . Polyethyl Glycol-Propyl Glycol (SYSTANE OP) Place 1 drop into both eyes daily as needed (dry eyes, allergies).    . pravastatin (PRAVACHOL) 20 MG tablet Take 1 tablet (20 mg total) by mouth every evening. Take 1 tablet up to once daily in the evening as tolerated. 30 tablet 11  . SYMBICORT 80-4.5 MCG/ACT inhaler 1 puff 2 (two) times daily.     Current Facility-Administered Medications  Medication Dose Route Frequency Provider Last Rate Last Admin  . 0.9 %  sodium chloride infusion  500 mL  Intravenous Continuous Irene Shipper, MD        Allergies:   Atorvastatin and Rosuvastatin   Past Medical History:  Diagnosis Date  . Anxiety   . Arthritis    thinks she has some in her spine, bursitis in hips  . Asthma    had it once when she had episode of bronchitis about 10 yrs ago  . Diverticulitis   . DIVERTICULOSIS, COLON 11/04/2004   Qualifier: Diagnosis of  By: Hardin Negus CMA (AAMA), [place]    . GERD (gastroesophageal reflux disease)   . Hepatitis    thinks she had Hepatits as a child  . History of colonic polyps   . Hyperlipidemia   . Hypertension   . Internal hemorrhoids   . PONV (postoperative nausea and vomiting)    states she gets deathly sick  . Vertigo     Past Surgical History:  Procedure Laterality Date  . BREAST BIOPSY Right   . ELBOW FRACTURE SURGERY Left   . HERNIA REPAIR     pt denies  . LAMINECTOMY  10/11/11   with fusion @ L4-5  . RETINAL TEAR REPAIR CRYOTHERAPY Right 2015  . TUBAL  LIGATION       Social History:  The patient  reports that she has never smoked. She has never used smokeless tobacco. She reports current alcohol use of about 3.0 standard drinks of alcohol per week. She reports that she does not use drugs.   Family History:  The patient's family history includes Diabetes in her brother, father, and sister; Heart disease in her father; Leukemia in her father; Lung cancer in her mother; Ovarian cancer in her mother; Rectal cancer in her sister.    ROS:  Please see the history of present illness. All other systems are reviewed and  Negative to the above problem except as noted.    PHYSICAL EXAM: VS:  BP 138/84   Pulse 61   Ht 5\' 7"  (1.702 m)   Wt 193 lb (87.5 kg)   SpO2 99%   BMI 30.23 kg/m   GEN: Well nourished, well developed, in no acute distress  HEENT: normal  Neck: JVP is normal  No carotid bruits Cardiac: RRR; no murmurs  No LE  edema  Respiratory:  clear to auscultation bilaterally, normal work of breathing GI: soft,  nontender, nondistended, + BS  No hepatomegaly  MS: no deformity Moving all extremities   Skin: warm and dry, no rash Neuro:  Strength and sensation are intact Psych: euthymic mood, full affect   EKG:  EKG sows SB 57 bpm    Lipid Panel    Component Value Date/Time   CHOL 173 04/20/2020 0755   TRIG 80 04/20/2020 0755   HDL 58 04/20/2020 0755   CHOLHDL 3.0 04/20/2020 0755   CHOLHDL 3 01/29/2014 0813   VLDL 20.2 01/29/2014 0813   LDLCALC 100 (H) 04/20/2020 0755   LDLDIRECT 125.6 03/28/2012 0922      Wt Readings from Last 3 Encounters:  07/06/20 193 lb (87.5 kg)  05/20/20 191 lb 3.2 oz (86.7 kg)  04/22/19 201 lb 12.8 oz (91.5 kg)      ASSESSMENT AND PLAN:  1  CAD  Pt with mild plaqing on CT scan   No symptoms of angina    2  Aortic dilitation Repeat CT scan aorta was 3.8 cm     Note very small nodule noted peripherally   5 mm   Hold on f/u as pt not high risk   3  HTN   BP is a little elevated  Follow for now   Pt has f/u with Dr Ardeth Perfect  4  HL  Tolerating pravastatin   LDL 100 HDL 58    5  Pulmonary  Plan for PFTs in Jan  Keep on inhalers      Signed, Dorris Carnes, MD  07/06/2020 4:04 PM    [address], [place], [place]  [address] Phone: [phone]; Fax: [phone]

## 2020-07-06 ENCOUNTER — Encounter: Payer: Self-pay | Admitting: Internal Medicine

## 2020-07-06 ENCOUNTER — Ambulatory Visit: Payer: Medicare HMO | Admitting: Internal Medicine

## 2020-07-06 ENCOUNTER — Other Ambulatory Visit: Payer: Self-pay

## 2020-07-06 VITALS — BP 138/84 | HR 61 | Ht 67.0 in | Wt 193.0 lb

## 2020-07-06 DIAGNOSIS — I2510 Atherosclerotic heart disease of native coronary artery without angina pectoris: Secondary | ICD-10-CM | POA: Diagnosis not present

## 2020-07-06 NOTE — Patient Instructions (Signed)
Medication Instructions:  No changes *If you need a refill on your cardiac medications before your next appointment, please call your pharmacy*   Lab Work: none If you have labs (blood work) drawn today and your tests are completely normal, you will receive your results only by: . MyChart Message (if you have MyChart) OR . A paper copy in the mail If you have any lab test that is abnormal or we need to change your treatment, we will call you to review the results.   Testing/Procedures: none   Follow-Up: At [facility], you and your health needs are our priority.  As part of our continuing mission to provide you with exceptional heart care, we have created designated Provider Care Teams.  These Care Teams include your primary Cardiologist (physician) and Advanced Practice Providers (APPs -  Physician Assistants and Nurse Practitioners) who all work together to provide you with the care you need, when you need it.   Your next appointment:   12 month(s)  The format for your next appointment:   In Person  Provider:   You may see Paula Ross, MD or one of the following Advanced Practice Providers on your designated Care Team:    Scott Weaver, PA-C  Vin Bhagat, PA-C   Other Instructions   

## 2020-07-07 ENCOUNTER — Other Ambulatory Visit: Payer: Self-pay | Admitting: Obstetrics and Gynecology

## 2020-07-07 DIAGNOSIS — Z1231 Encounter for screening mammogram for malignant neoplasm of breast: Secondary | ICD-10-CM

## 2020-07-07 DIAGNOSIS — H6123 Impacted cerumen, bilateral: Secondary | ICD-10-CM | POA: Diagnosis not present

## 2020-07-10 ENCOUNTER — Other Ambulatory Visit (HOSPITAL_COMMUNITY)
Admission: RE | Admit: 2020-07-10 | Discharge: 2020-07-10 | Disposition: A | Discharge status: Home / Self Care | Payer: Medicare HMO | Source: Ambulatory Visit | Attending: Internal Medicine | Admitting: Internal Medicine

## 2020-07-10 DIAGNOSIS — Z20822 Contact with and (suspected) exposure to covid-19: Secondary | ICD-10-CM | POA: Diagnosis not present

## 2020-07-10 DIAGNOSIS — Z01812 Encounter for preprocedural laboratory examination: Secondary | ICD-10-CM | POA: Diagnosis not present

## 2020-07-10 LAB — SARS CORONAVIRUS 2 (TAT 6-24 HRS): SARS Coronavirus 2: NEGATIVE

## 2020-07-13 ENCOUNTER — Other Ambulatory Visit: Payer: Self-pay

## 2020-07-13 ENCOUNTER — Ambulatory Visit (INDEPENDENT_AMBULATORY_CARE_PROVIDER_SITE_OTHER): Payer: Medicare HMO | Admitting: Internal Medicine

## 2020-07-13 ENCOUNTER — Other Ambulatory Visit: Payer: Self-pay | Admitting: *Deleted

## 2020-07-13 DIAGNOSIS — R059 Cough, unspecified: Secondary | ICD-10-CM | POA: Diagnosis not present

## 2020-07-13 DIAGNOSIS — R062 Wheezing: Secondary | ICD-10-CM

## 2020-07-13 LAB — PULMONARY FUNCTION TEST
DL/VA % pred: 119 %
DL/VA: 4.82 ml/min/mmHg/L
DLCO cor % pred: 123 %
DLCO cor: 26.37 ml/min/mmHg
DLCO unc % pred: 123 %
DLCO unc: 26.37 ml/min/mmHg
FEF 25-75 Post: 3.01 L/sec
FEF 25-75 Pre: 2.27 L/sec
FEF2575-%Change-Post: 32 %
FEF2575-%Pred-Post: 148 %
FEF2575-%Pred-Pre: 111 %
FEV1-%Change-Post: 7 %
FEV1-%Pred-Post: 113 %
FEV1-%Pred-Pre: 105 %
FEV1-Post: 2.86 L
FEV1-Pre: 2.66 L
FEV1FVC-%Change-Post: 7 %
FEV1FVC-%Pred-Pre: 101 %
FEV6-%Change-Post: 0 %
FEV6-%Pred-Post: 107 %
FEV6-%Pred-Pre: 107 %
FEV6-Post: 3.44 L
FEV6-Pre: 3.44 L
FEV6FVC-%Change-Post: 0 %
FEV6FVC-%Pred-Post: 104 %
FEV6FVC-%Pred-Pre: 103 %
FVC-%Change-Post: 0 %
FVC-%Pred-Post: 103 %
FVC-%Pred-Pre: 104 %
FVC-Post: 3.47 L
FVC-Pre: 3.47 L
Post FEV1/FVC ratio: 82 %
Post FEV6/FVC ratio: 100 %
Pre FEV1/FVC ratio: 77 %
Pre FEV6/FVC Ratio: 99 %
RV % pred: 116 %
RV: 2.75 L
TLC % pred: 111 %
TLC: 6.11 L

## 2020-07-13 NOTE — Progress Notes (Signed)
PFT done today. 

## 2020-07-14 LAB — IGE: IgE (Immunoglobulin E), Serum: 70 kU/L (ref <=114)

## 2020-07-15 ENCOUNTER — Telehealth: Payer: Self-pay | Admitting: Internal Medicine

## 2020-07-15 NOTE — Progress Notes (Signed)
PFT normal but suggestive of mild airt trapping/hyperinflation that can be seen in ashma. Will discuss at followup. Results to be given over phone

## 2020-07-15 NOTE — Progress Notes (Signed)
Blood IgE is normal

## 2020-07-15 NOTE — Telephone Encounter (Signed)
07/15/20   Return telephone call to patient. Patient reports that when she was having her pulmonary function test on 07/13/2020. June Leap, CMA had given her contact information for potential providers to do home health for for a neighbor of the patient's. Unfortunately lost the contact information for this.  She would like a call back from June Leap, [place] regarding the contact information that was provided. Okay to leave detailed voicemail on [phone] telephone number.  We will route to June Leap for follow-up.  Wyn Quaker, FNP

## 2020-08-19 ENCOUNTER — Other Ambulatory Visit: Payer: Self-pay

## 2020-08-19 ENCOUNTER — Ambulatory Visit
Admission: RE | Admit: 2020-08-19 | Discharge: 2020-08-19 | Disposition: A | Discharge status: Home / Self Care | Payer: Medicare HMO | Source: Ambulatory Visit | Attending: Obstetrics and Gynecology | Admitting: Obstetrics and Gynecology

## 2020-08-19 DIAGNOSIS — Z1231 Encounter for screening mammogram for malignant neoplasm of breast: Secondary | ICD-10-CM

## 2020-08-19 DIAGNOSIS — E785 Hyperlipidemia, unspecified: Secondary | ICD-10-CM | POA: Diagnosis not present

## 2020-08-26 DIAGNOSIS — Z1212 Encounter for screening for malignant neoplasm of rectum: Secondary | ICD-10-CM | POA: Diagnosis not present

## 2020-08-28 DIAGNOSIS — Z01419 Encounter for gynecological examination (general) (routine) without abnormal findings: Secondary | ICD-10-CM | POA: Diagnosis not present

## 2020-09-04 DIAGNOSIS — K219 Gastro-esophageal reflux disease without esophagitis: Secondary | ICD-10-CM | POA: Diagnosis not present

## 2020-09-04 DIAGNOSIS — I2510 Atherosclerotic heart disease of native coronary artery without angina pectoris: Secondary | ICD-10-CM | POA: Diagnosis not present

## 2020-09-04 DIAGNOSIS — I712 Thoracic aortic aneurysm, without rupture: Secondary | ICD-10-CM | POA: Diagnosis not present

## 2020-09-04 DIAGNOSIS — Z0000 Encounter for general adult medical examination without abnormal findings: Secondary | ICD-10-CM | POA: Diagnosis not present

## 2020-09-04 DIAGNOSIS — I10 Essential (primary) hypertension: Secondary | ICD-10-CM | POA: Diagnosis not present

## 2020-09-04 DIAGNOSIS — M791 Myalgia, unspecified site: Secondary | ICD-10-CM | POA: Diagnosis not present

## 2020-09-09 ENCOUNTER — Ambulatory Visit: Payer: Medicare HMO | Admitting: Internal Medicine

## 2020-09-09 ENCOUNTER — Encounter: Payer: Self-pay | Admitting: Internal Medicine

## 2020-09-09 ENCOUNTER — Other Ambulatory Visit: Payer: Self-pay

## 2020-09-09 VITALS — BP 122/72 | HR 62 | Temp 97.7°F | Ht 68.0 in | Wt 186.6 lb

## 2020-09-09 DIAGNOSIS — R490 Dysphonia: Secondary | ICD-10-CM

## 2020-09-09 DIAGNOSIS — R062 Wheezing: Secondary | ICD-10-CM

## 2020-09-09 DIAGNOSIS — J4530 Mild persistent asthma, uncomplicated: Secondary | ICD-10-CM

## 2020-09-09 DIAGNOSIS — Z7189 Other specified counseling: Secondary | ICD-10-CM

## 2020-09-09 DIAGNOSIS — R911 Solitary pulmonary nodule: Secondary | ICD-10-CM | POA: Diagnosis not present

## 2020-09-09 DIAGNOSIS — R059 Cough, unspecified: Secondary | ICD-10-CM

## 2020-09-09 NOTE — Progress Notes (Signed)
OV 05/20/2020  Subjective:  Patient ID: Cynthia Padilla, female , DOB: [birth year] , age 70 y.o. , MRN: [id number] , ADDRESS: [place] [address] PCP Cynthia Hatchet, MD Patient Care Team: Cynthia Hatchet, MD as PCP - General (Internal Medicine) Cynthia Records, MD as PCP - Cardiology (Cardiology)  This Provider for this visit: Treatment Team:  Attending Provider: Brand Males, MD    05/20/2020 -   Chief Complaint  Patient presents with  . Consult    Pt is here due to a persistent cough she has had since 8 months. Pt also has had an occ sore throat and sinus problems also with wheezing which has subsided.     HPI Cynthia Padilla 69 y.o. 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  She is the wife of my patient Cynthia Padilla who suffers from asthma and who is on interleukin-5 receptor blockade.  I have known Cynthia Padilla through her visits accompanying her husband.  In this particular visit she presents as the patient herself.  She tells me that she was at baseline health up until March 2021.  She started having dry cough.  Along with this during acceleration there was some wheezing.  She saw primary care physician who gave her prednisone Breo and Tessalon Perles.  Then within a few weeks things resolved.  Then she came off Breo.  I believe the prednisone initially was for 10 days.  Then again in May same thing happened.  She took another prednisone for 10 days.  This time took Symbicort 1 puff twice daily along with albuterol.  There was some rattling and mucus.  Apparently chest x-ray was negative.  She did go to an urgent care when the chest x-ray was done.  She was given Decadron.  After that she got better and then was doing completely well till September 2021.  In the gap between May 2021 and September 2021 she is not taking any Symbicort or any inhaled steroid.  Then in September 2021 the same thing happen and around mid September she took prednisone for  10 days.  Then by end of September same thing recurred.  This time was a little bit different started as a sore throat and then went up the sinuses Covid testing x2 was negative.  She says she has some cough wheezing nocturnal symptoms cough is mostly dry but sometimes she has mucus production.  This time she is not taking prednisone but she is doing Symbicort.  She feels after a few weeks things are improving.  At baseline she has hypertension she is not on ACE inhibitor she is on hydrochlorothiazide.  She has acid reflux but not symptomatic anymore.  In the past she took PPI for many years and then lost weight and followed a healthy diet and got rid of acid reflux.  Her last prednisone was in September 2021.  Of note she is concerned about the discoloration in her tongue.  She describes this is black coating.  On my exam it was gray-green.  She admitted to using cough lozenges of a similar color for the last 2 weeks and the discoloration present only for the last 1 week.  Her RSI cough score as recently experienced is as below.    Cynthia Padilla Reflux Symptom Index (> 13-15 suggestive of LPR cough) 0 -> 5  =  none ->severe problem  Hoarseness of problem with voice 4  Clearing  Of Throat 3  Excess throat  mucus or feeling of post nasal drip 3  Difficulty swallowing food, liquid or tablets 0  Cough after eating or lying down 1  Breathing difficulties or choking episodes 3.5  Troublesome or annoying cough 5  Sensation of something sticking in throat or lump in throat 0  Heartburn, chest pain, indigestion, or stomach acid coming up 1  TOTAL 20.5      Of note her last lung imaging was in January 2021 when she had CT angiogram chest for slightly enlarged aorta.  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  She follows with Cynthia. Dorris Padilla for this.  On the lung images the mediastinum was felt to be normal and the pulmonary parenchyma is felt to be normal.  She does have a 5 mm  peripheral right upper lobe nodule.  It appears that Cynthia. Harrington Padilla reviewed the CT scan along with old ones from a previous health system.  And she has had these nodules for some years and it is stable.  [name]  She has not been a smoker.   Other issue is that she is not vaccinated herself against COVID.  She normally gets vaccines.  But apparently a few years ago teach took the shingles vaccine [it was not Shingrix but the zoster] and she had right arm swelling.  Therefore she is can of petrified.  But recently she not had issues with flu shot or other vaccines in terms of major allergic reactions.  We discussed the overall incidence of anaphylaxis and that the risk is being 2% but the risk is lower in patientts in  patients were generated to have tolerated vaccines without any allergic reaction.  We discussed the 3 options.  Recommended anecdotally Pfizer over the other 2.  ROS - per HPI  Results for Cynthia Padilla (MRN [id number]) as of 05/20/2020 15:55  Ref. Range 07/19/2011 08:32 03/28/2012 09:22 03/27/2013 08:47 01/29/2014 08:13 03/06/2014 12:13  Eosinophils Absolute Latest Ref Range: 0.0 - 0.7 K/uL 0.2 0.1 0.1 0.1 0.1   CT angiogram chest August 19, 2019 -.IMPRESSION: 1. No evidence for thoracic aortic aneurysm on this contrast enhanced study. The ascending thoracic aorta measures approximately 3.8 cm on this examination. 2. There is a 5 mm pulmonary nodule in the peripheral right upper lobe. No follow-up needed if patient is low-risk. Non-contrast chest CT can be considered in 12 months if patient is high-risk. This recommendation follows the consensus statement: Guidelines for Management of Incidental Pulmonary Nodules Detected on CT Images: From the Fleischner Society 2017; Radiology 2017; 284:228-243.   Electronically Signed   By: Cynthia Padilla M.D.   On: 08/19/2019 16:33  No results found for: NITRICOXIDE    OV  09/09/2020  Subjective:  Patient ID: Cynthia Padilla, female , DOB: 01-23-1951 , age 77 y.o. , MRN: [id number] , ADDRESS: [place] [address] PCP Cynthia Hatchet, MD Patient Care Team: Cynthia Hatchet, MD as PCP - General (Internal Medicine) Cynthia Records, MD as PCP - Cardiology (Cardiology)  This Provider for this visit: Treatment Team:  Attending Provider: Brand Males, MD    09/09/2020 -   Chief Complaint  Patient presents with  . Follow-up    Doing well     HPI Adalis Gatti 70 y.o. -presents for asthma follow-up.  She continues to do well.  The Symbicort is really helping her but causes hoarseness of voice.  Therefore it  is a difficult balance.  She is found that if she takes Symbicort at night once daily then it is fine.  Nevertheless the last few days she has some hoarseness which she believes is from Symbicort.  There is no respiratory illness.  We offered the option of switching to Pulmicort nebulizer but at this point she just wants to continue with Symbicort at night.  In terms of COVID-19: She is not yet vaccinated.  She is fearful of the side effects.  Because of this she is unable to travel to [place].  She checked an IgG after a respiratory infection in the fall with IgG was negative.  Therefore she had to cancel her [place] trip.  She is also not able to travel to [place] because of all these restrictions.  She has asthma and age 67.  She has some risk factors for getting serious Covid.  Therefore I discussed the option about referring for monoclonal antibody Evusheld but at this point in time she wants to hold off.  She wants to talk to her travel operator [employer] about making a decision.  She is hopeful that policy restrictions on travel will lift off.  We did did discuss the option about antivirals if she were to get Covid.  She will continue to stay socially distance and mask and take appropriate precautions.       PFT  PFT Results  Latest Ref Rng & Units 07/13/2020  FVC-Pre L 3.47  FVC-Predicted Pre % 104  FVC-Post L 3.47  FVC-Predicted Post % 103  Pre FEV1/FVC % % 77  Post FEV1/FCV % % 82  FEV1-Pre L 2.66  FEV1-Predicted Pre % 105  FEV1-Post L 2.86  DLCO uncorrected ml/min/mmHg 26.37  DLCO UNC% % 123  DLCO corrected ml/min/mmHg 26.37  DLCO COR %Predicted % 123  DLVA Predicted % 119  TLC L 6.11  TLC % Predicted % 111  RV % Predicted % 116       has a past medical history of Anxiety, Arthritis, Asthma, Diverticulitis, DIVERTICULOSIS, COLON (11/04/2004), GERD (gastroesophageal reflux disease), Hepatitis, History of colonic polyps, Hyperlipidemia, Hypertension, Internal hemorrhoids, PONV (postoperative nausea and vomiting), and Vertigo.   reports that she has never smoked. She has never used smokeless tobacco.  Past Surgical History:  Procedure Laterality Date  . BREAST BIOPSY Right   . ELBOW FRACTURE SURGERY Left   . HERNIA REPAIR     pt denies  . LAMINECTOMY  10/11/11   with fusion @ L4-5  . RETINAL TEAR REPAIR CRYOTHERAPY Right 2015  . TUBAL LIGATION      Allergies  Allergen Reactions  . Atorvastatin Other (See Comments)    myalgia  . Rosuvastatin     Myalgias on 2.5mg  and 5mg  dose  . Rosuvastatin Calcium     Immunization History  Administered Date(s) Administered  . Influenza Split 05/13/2013  . Influenza, High Dose Seasonal PF 06/07/2017  . Influenza-Unspecified 06/08/2014, 05/11/2015, 05/08/2016, 08/08/2018  . Pneumococcal Conjugate-13 04/27/2015  . Pneumococcal Polysaccharide-23 08/08/2010  . Td 01/07/2003  . Tdap 02/04/2014  . Zoster 03/30/2010, 08/08/2010    Family History  Problem Relation Age of Onset  . Ovarian cancer Mother   . Lung cancer Mother   . Diabetes Father   . Heart disease Father   . Leukemia Father   . Diabetes Sister        x 2  . Rectal cancer Sister        anal  . Diabetes Brother  x 3  . Colon cancer Neg Hx   . Throat cancer Neg Hx   . Kidney  disease Neg Hx   . Anesthesia problems Neg Hx   . Liver disease Neg Hx   . Breast cancer Neg Hx      Current Outpatient Medications:  .  acetaminophen (TYLENOL) 500 MG tablet, Take 1,000 mg by mouth daily as needed for headache., Disp: , Rfl:  .  albuterol (VENTOLIN HFA) 108 (90 Base) MCG/ACT inhaler, Inhale into the lungs., Disp: , Rfl:  .  benzonatate (TESSALON) 200 MG capsule, Take 200 mg by mouth 3 (three) times daily as needed., Disp: , Rfl:  .  Cholecalciferol 25 MCG (1000 UT) capsule, Vitamin D3 25 mcg (1,000 unit) capsule  Take by oral route., Disp: , Rfl:  .  Coenzyme Q10 (COQ10) 150 MG CAPS, 150 mg., Disp: , Rfl:  .  hydrochlorothiazide (HYDRODIURIL) 25 MG tablet, Take 25 mg by mouth daily. , Disp: , Rfl:  .  meclizine (ANTIVERT) 25 MG tablet, Take 1 tablet (25 mg total) by mouth 3 (three) times daily as needed., Disp: 30 tablet, Rfl: 0 .  Polyethyl Glycol-Propyl Glycol (SYSTANE OP), Place 1 drop into both eyes daily as needed (dry eyes, allergies)., Disp: , Rfl:  .  pravastatin (PRAVACHOL) 20 MG tablet, Take 1 tablet (20 mg total) by mouth every evening. Take 1 tablet up to once daily in the evening as tolerated., Disp: 30 tablet, Rfl: 11 .  SYMBICORT 80-4.5 MCG/ACT inhaler, 1 puff 2 (two) times daily., Disp: , Rfl:   Current Facility-Administered Medications:  .  0.9 %  sodium chloride infusion, 500 mL, Intravenous, Continuous, Irene Shipper, MD      Objective:   Vitals:   09/09/20 1133  BP: 122/72  Pulse: 62  Temp: 97.7 F (36.5 C)  TempSrc: Oral  SpO2: 98%  Weight: 186 lb 9.6 oz (84.6 kg)  Height: 5\' 8"  (1.727 m)    Estimated body mass index is 28.37 kg/m as calculated from the following:   Height as of this encounter: 5\' 8"  (1.727 m).   Weight as of this encounter: 186 lb 9.6 oz (84.6 kg).  @WEIGHTCHANGE @  [employer]   09/09/20 1133  Weight: 186 lb 9.6 oz (84.6 kg)   General: No distress. Looks well Neuro: Alert and Oriented x 3. GCS 15. Speech  normal Psych: Pleasant Resp:  Barrel Chest - bi.  Wheeze - bi, Crackles - bib, No overt respiratory distress CVS: Normal heart sounds. Murmurs - bi Ext: Stigmata of Connective Tissue Disease - no HEENT: Normal upper airway. PEERL +. No post nasal drip        Assessment:       ICD-10-CM   1. Mild persistent asthma without complication  A999333   2. Cough  R05.9   3. Wheezing  R06.2   4. Hoarse voice quality  R49.0   5. Advice given about COVID-19 virus infection  Z71.89   6. Nodule of upper lobe of right lung  R91.1        Plan:     Patient Instructions     ICD-10-CM   1. Mild persistent asthma without complication  A999333   2. Cough  R05.9   3. Wheezing  R06.2   4. Hoarse voice quality  R49.0   5. Advice given about COVID-19 virus infection  Z71.89   6. Nodule of upper lobe of right lung  R91.1     Cough/Wheezing/Clinical asthma  -  glad symbicort is helping and you are able to control it with Symbicort once daily -Recognized that Symbicort is causing some hoarseness of voice but at this point in time it is manageable  Plan - Continue Symbicort once daily at night with albuterol as needed -If hoarseness of voice is a significant problem in the future we can consider Pulmicort nebulizer or even switch to Singulair oral therapy  Advise on Covid 19 Travel advice  -Too bad that are too many restrictions around travel because of vaccination status -You might qualify for monoclonal antibody called EVUSHELD for risk based prophylaxis against COVID-19  Plan  -Hopefully these restrictions will get lifted in the next few to several months and you will be able to travel -You have your interest in monoclonal antibody prophylaxis let me know and I can make a referral to either Cynthia. Horald Pollen or [facility] -Continue to stay Masked and avoid clustering -Take advantage of home-based antigen/lateral flow testing -If have you get COVID-19 you could qualify for  antivirals at which time we can make a referral  3mm Nodule RUL - seen in CT angio jan 2021   - per your history and Cynthia Cynthia Padilla review these are old and no follwup indicated  Plan - will hold off on CT chest for the moment  Followup  - 6-9 months or sooner if needed     SIGNATURE    Cynthia. Brand Padilla, M.D., F.C.C.P,  Pulmonary and Critical Care Medicine Staff Physician, [place] Director - Interstitial Lung Disease  Program  [facility] [place], [place], [address]  Pager: [phone], If no answer or between  15:00h - 7:00h: call [phone] Telephone: [phone]  12:12 PM 09/09/2020 .

## 2020-09-09 NOTE — Patient Instructions (Addendum)
ICD-10-CM   1. Mild persistent asthma without complication  Q25.95   2. Cough  R05.9   3. Wheezing  R06.2   4. Hoarse voice quality  R49.0   5. Advice given about COVID-19 virus infection  Z71.89   6. Nodule of upper lobe of right lung  R91.1     Cough/Wheezing/Clinical asthma  - glad symbicort is helping and you are able to control it with Symbicort once daily -Recognized that Symbicort is causing some hoarseness of voice but at this point in time it is manageable  Plan - Continue Symbicort once daily at night with albuterol as needed -If hoarseness of voice is a significant problem in the future we can consider Pulmicort nebulizer or even switch to Singulair oral therapy  Advise on Covid 19 Travel advice  -Too bad that are too many restrictions around travel because of vaccination status -You might qualify for monoclonal antibody called EVUSHELD for risk based prophylaxis against COVID-19  Plan  -Hopefully these restrictions will get lifted in the next few to several months and you will be able to travel -You have your interest in monoclonal antibody prophylaxis let me know and I can make a referral to either Dr. Horald Pollen or [facility] -Continue to stay Masked and avoid clustering -Take advantage of home-based antigen/lateral flow testing -If have you get COVID-19 you could qualify for antivirals at which time we can make a referral  78mm Nodule RUL - seen in CT angio jan 2021   - per your history and Dr Dorris Carnes review these are old and no follwup indicated  Plan - will hold off on CT chest for the moment  Followup  - 6-9 months or sooner if needed

## 2020-10-02 DIAGNOSIS — N6320 Unspecified lump in the left breast, unspecified quadrant: Secondary | ICD-10-CM | POA: Diagnosis not present

## 2020-10-06 ENCOUNTER — Other Ambulatory Visit: Payer: Self-pay | Admitting: Obstetrics and Gynecology

## 2020-10-06 ENCOUNTER — Other Ambulatory Visit: Payer: Self-pay

## 2020-10-06 ENCOUNTER — Ambulatory Visit
Admission: RE | Admit: 2020-10-06 | Discharge: 2020-10-06 | Disposition: A | Discharge status: Home / Self Care | Payer: Medicare HMO | Source: Ambulatory Visit | Attending: Obstetrics and Gynecology | Admitting: Obstetrics and Gynecology

## 2020-10-06 DIAGNOSIS — N6324 Unspecified lump in the left breast, lower inner quadrant: Secondary | ICD-10-CM | POA: Diagnosis not present

## 2020-10-06 DIAGNOSIS — N6320 Unspecified lump in the left breast, unspecified quadrant: Secondary | ICD-10-CM

## 2020-10-06 DIAGNOSIS — R922 Inconclusive mammogram: Secondary | ICD-10-CM | POA: Diagnosis not present

## 2020-10-06 DIAGNOSIS — N6322 Unspecified lump in the left breast, upper inner quadrant: Secondary | ICD-10-CM | POA: Diagnosis not present

## 2020-10-06 IMAGING — MG MM DIGITAL DIAGNOSTIC UNILAT*L* W/ TOMO W/ CAD
6 series · 6 of 18 positions shown · non-contrast
Comparison: Previous exam(s).

CLINICAL DATA: 69-year-old female presenting for evaluation of a
palpable lump in the left breast.

EXAM:
DIGITAL DIAGNOSTIC UNILATERAL LEFT MAMMOGRAM WITH TOMOSYNTHESIS AND
CAD; ULTRASOUND LEFT BREAST LIMITED
TECHNIQUE: Left digital diagnostic mammography and breast tomosynthesis was
performed. The images were evaluated with computer-aided detection.;
Targeted ultrasound examination of the left breast was performed

[L MLO synth-2D]
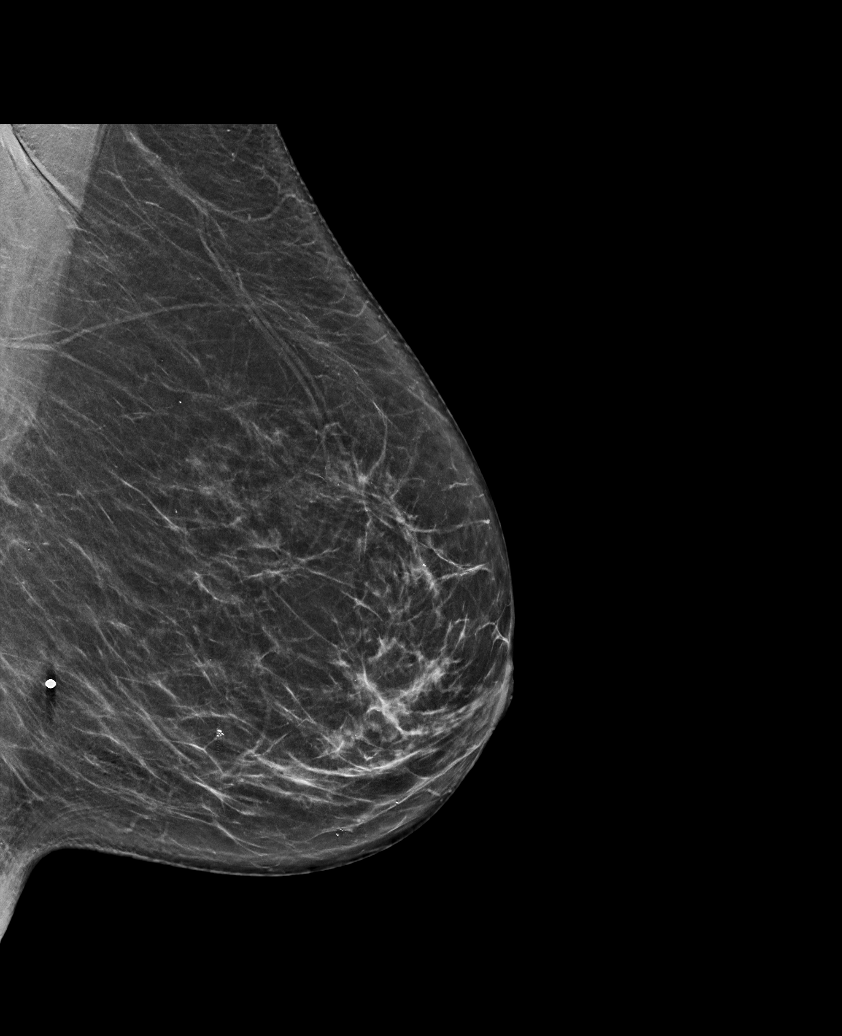

[L CC synth-2D (1 of 2)]
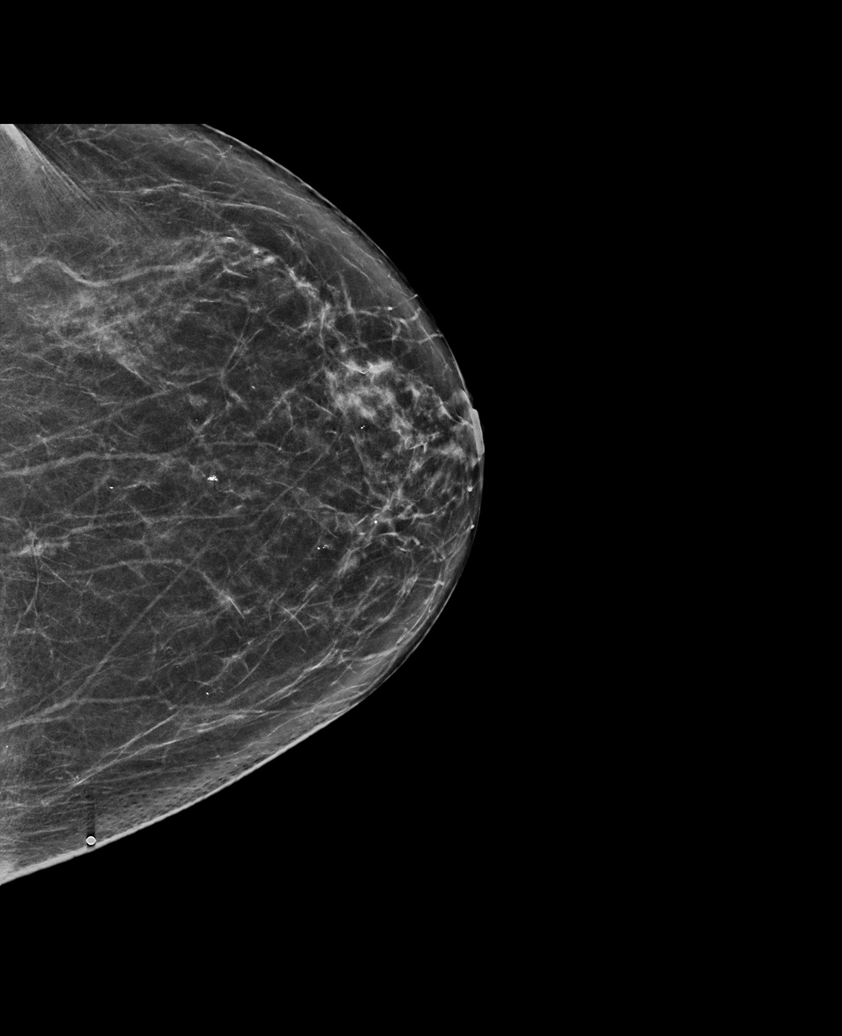

[L CC synth-2D (2 of 2)]
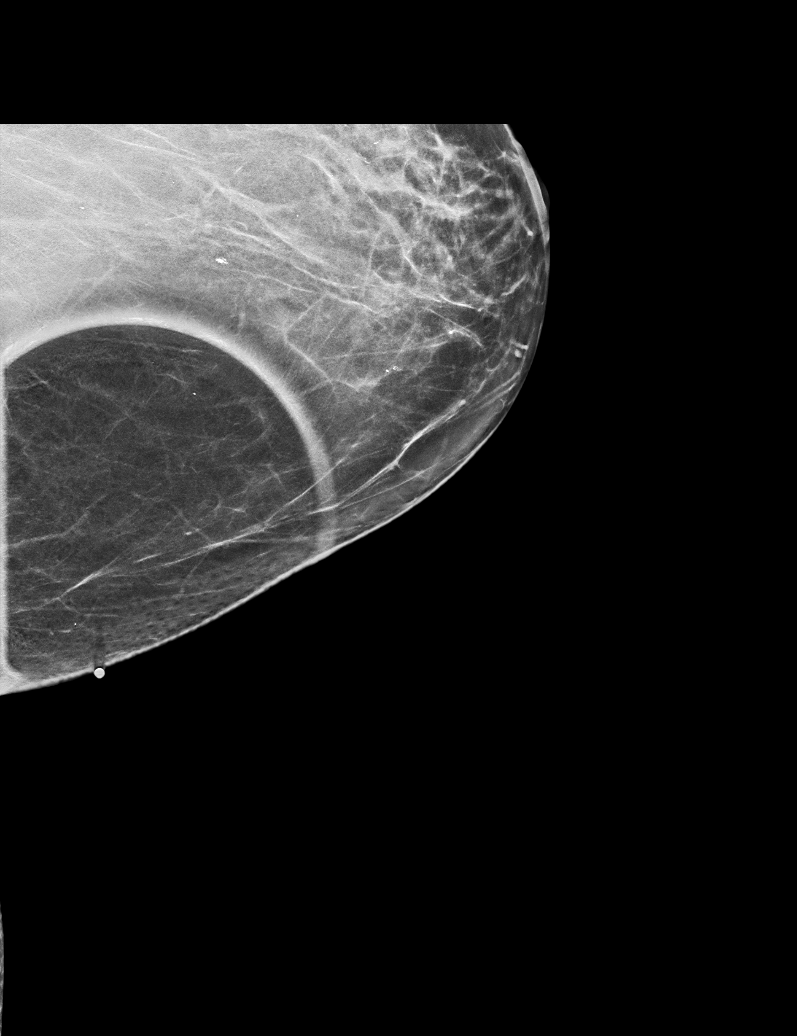

[L MLO tomo · tomo slice 37/72.0]
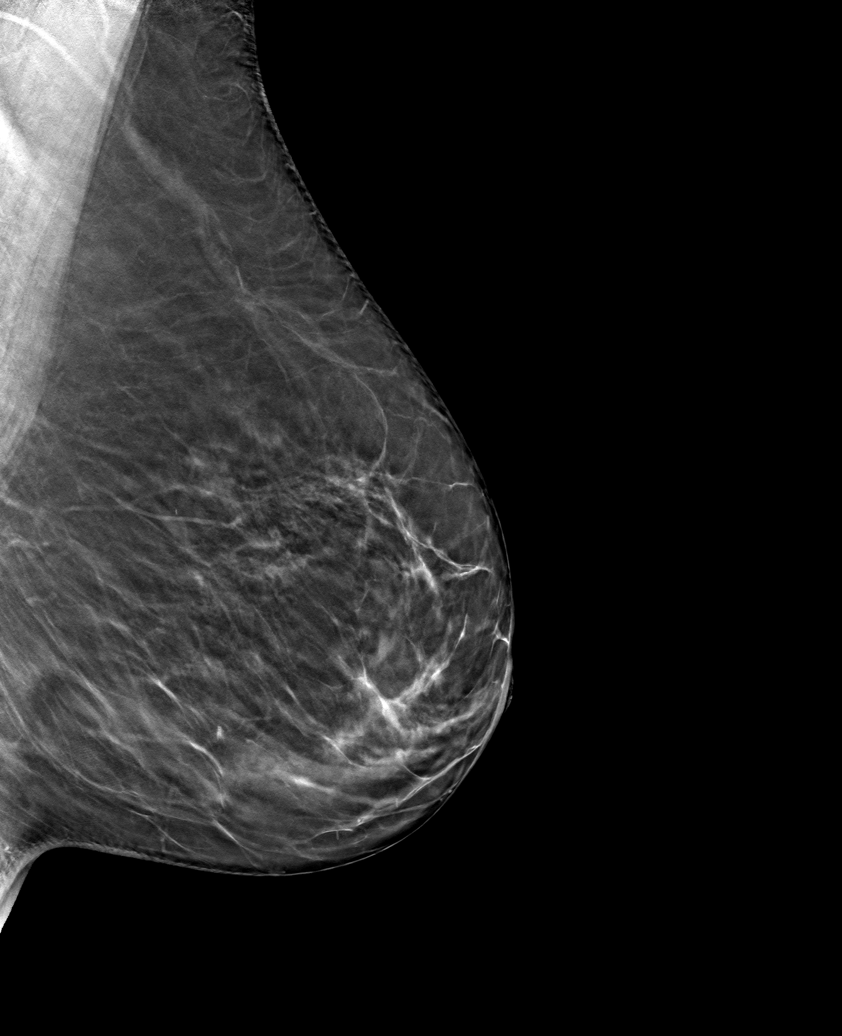

[L CC tomo (1 of 2) · tomo slice 35/68.0]
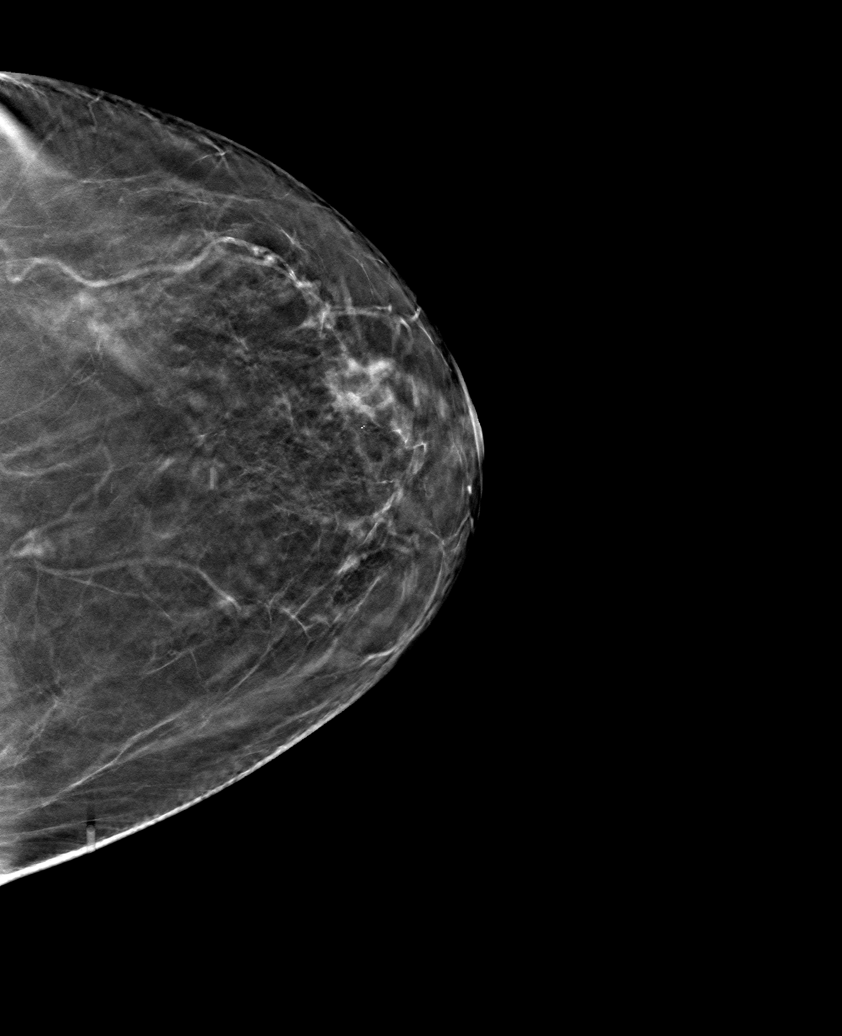

[L CC tomo (2 of 2) · tomo slice 29/58.0]
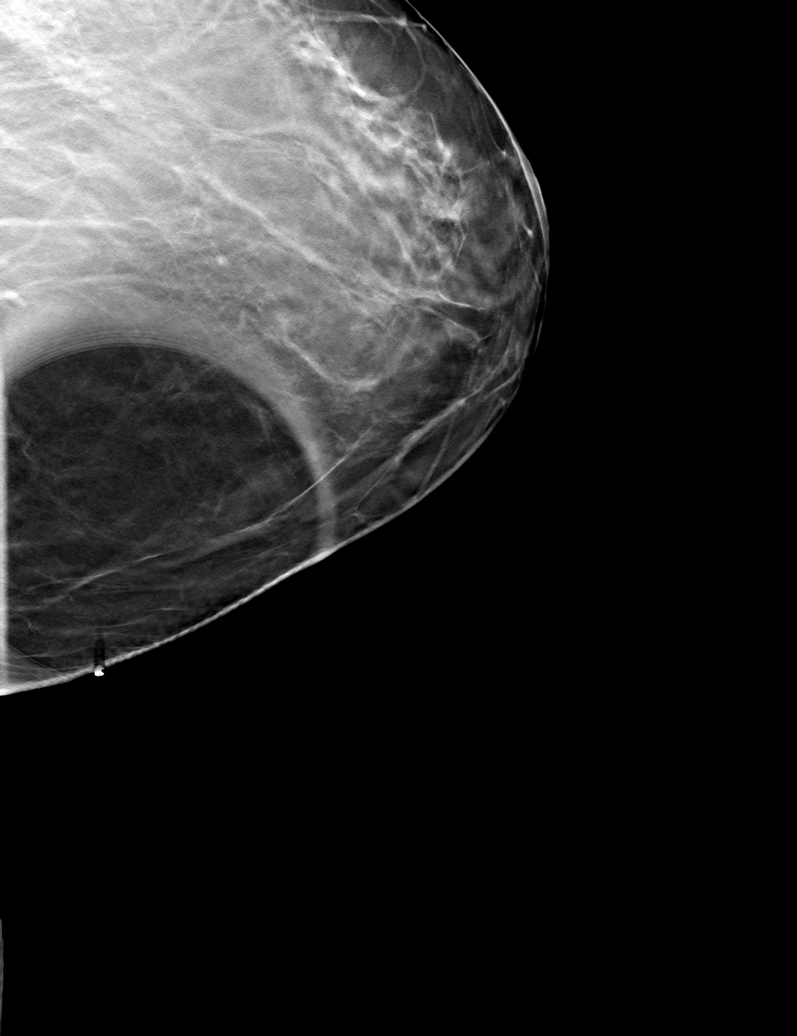

[6 of 18 positions shown; findings below may reference images not displayed]

ACR Breast Density Category b: There are scattered areas of
fibroglandular density.
FINDINGS: A BB has been placed along the medial aspect of the left breast
indicating the palpable site of concern. There are no suspicious
mammographic findings deep to the marker. No suspicious
calcifications, masses or areas of distortion are seen in the left
breast.

Mammographic images were processed with CAD.

Physical exam of the palpable site in the medial left breast
demonstrates a soft superficial mobile palpable lump.

Ultrasound targeted to the left breast at 9 o'clock, 12 cm from the
nipple demonstrates an isoechoic oval mass measuring 1.3 x 0.5 x
cm. This either represents a small lipoma or a normal fat lobule.
IMPRESSION: 1. The palpable lump in the left breast either represents a benign
lipoma or a normal fat lobule.

2.  No evidence of left breast malignancy.

RECOMMENDATION:
Return to routine screening mammography is recommended. The patient
will be due for screening in [DATE].

I have discussed the findings and recommendations with the patient.
If applicable, a reminder letter will be sent to the patient
regarding the next appointment.

BI-RADS CATEGORY  1: Negative.

## 2020-10-06 IMAGING — US US BREAST*L* LIMITED INC AXILLA
1 series · 7 of 7 positions shown · non-contrast
Comparison: Previous exam(s).

CLINICAL DATA: 69-year-old female presenting for evaluation of a
palpable lump in the left breast.

EXAM:
DIGITAL DIAGNOSTIC UNILATERAL LEFT MAMMOGRAM WITH TOMOSYNTHESIS AND
CAD; ULTRASOUND LEFT BREAST LIMITED
TECHNIQUE: Left digital diagnostic mammography and breast tomosynthesis was
performed. The images were evaluated with computer-aided detection.;
Targeted ultrasound examination of the left breast was performed

[Series 1: us breast*left* limited inc axilla · 0.06mm/px · 7 of 7 slices shown]
[im 1/7]
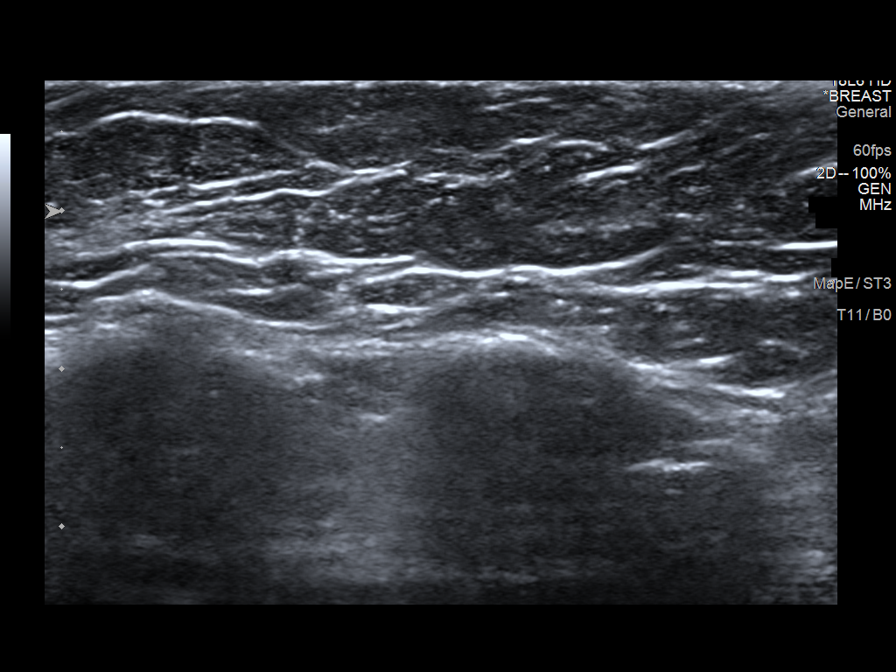
[im 2/7]
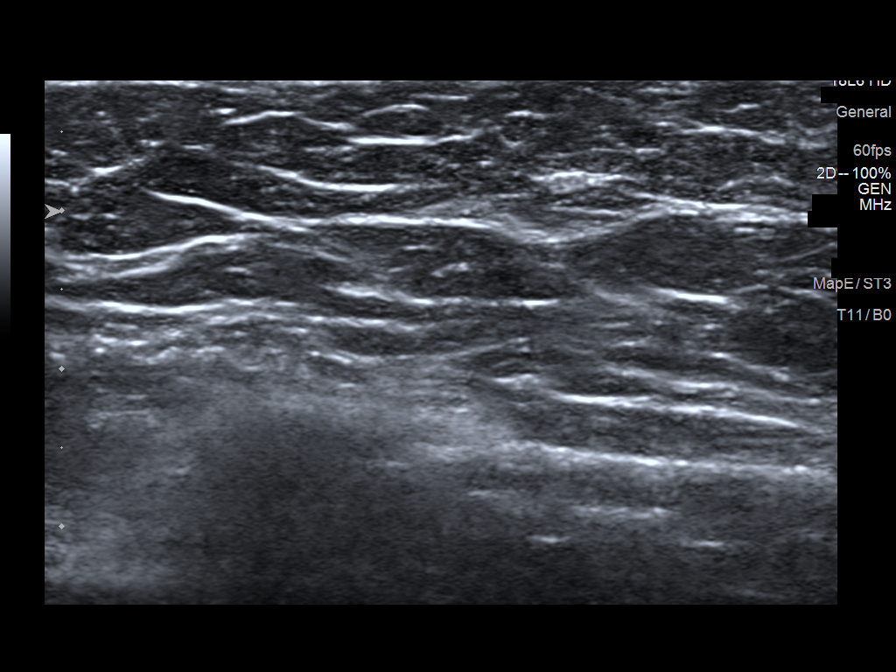
[im 3/7]
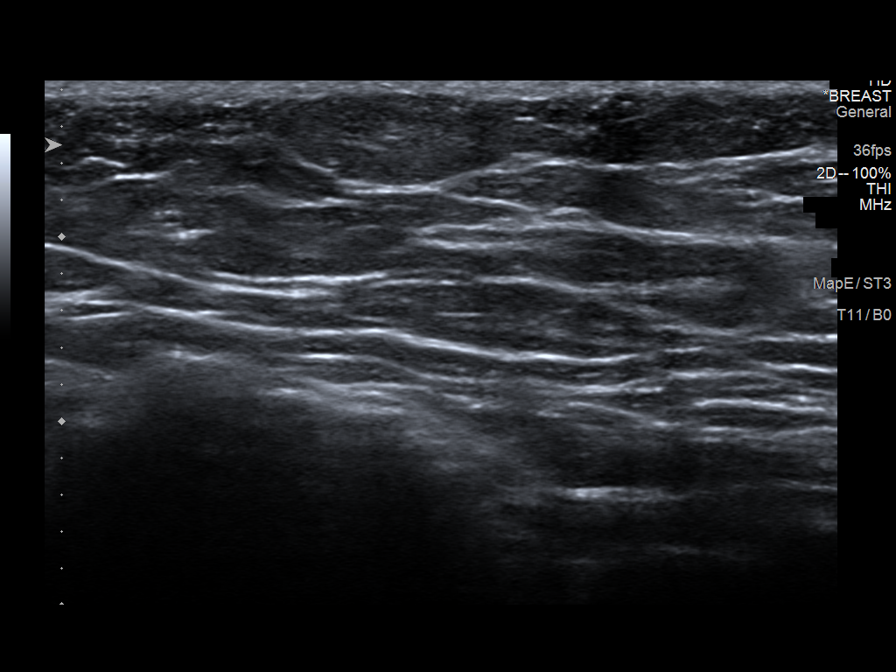
[im 4/7]
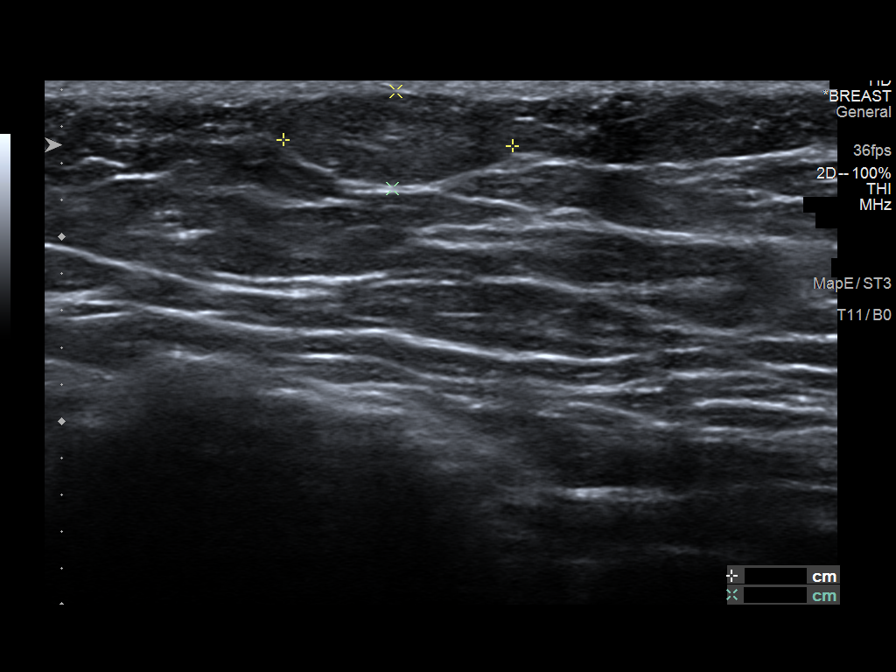
[im 5/7]
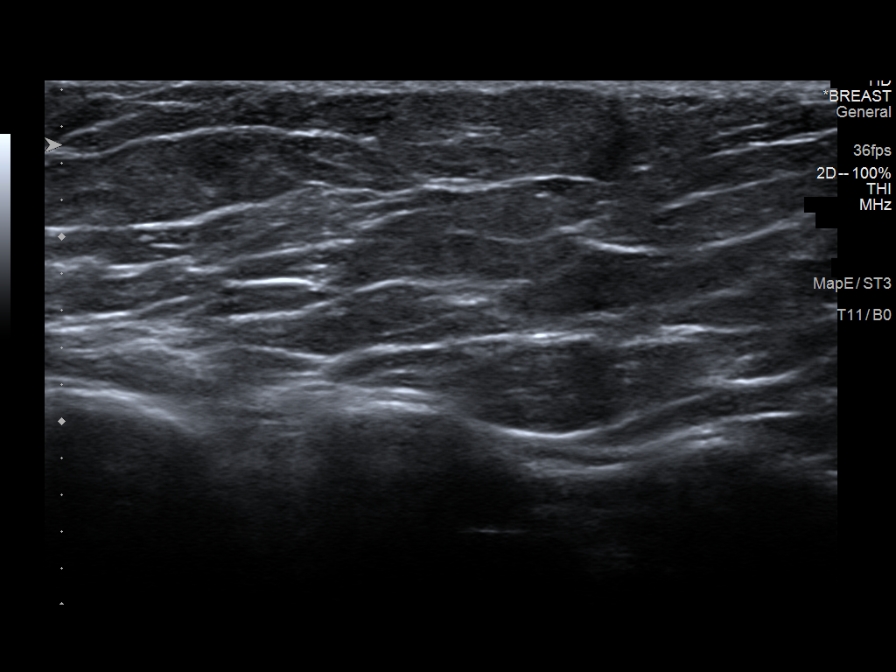
[im 6/7]
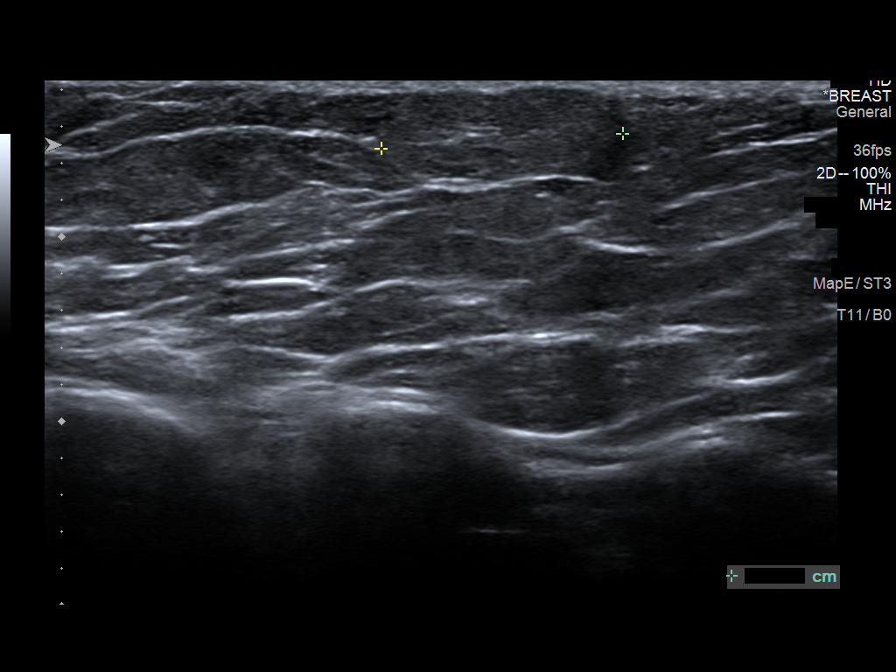
[im 7/7]
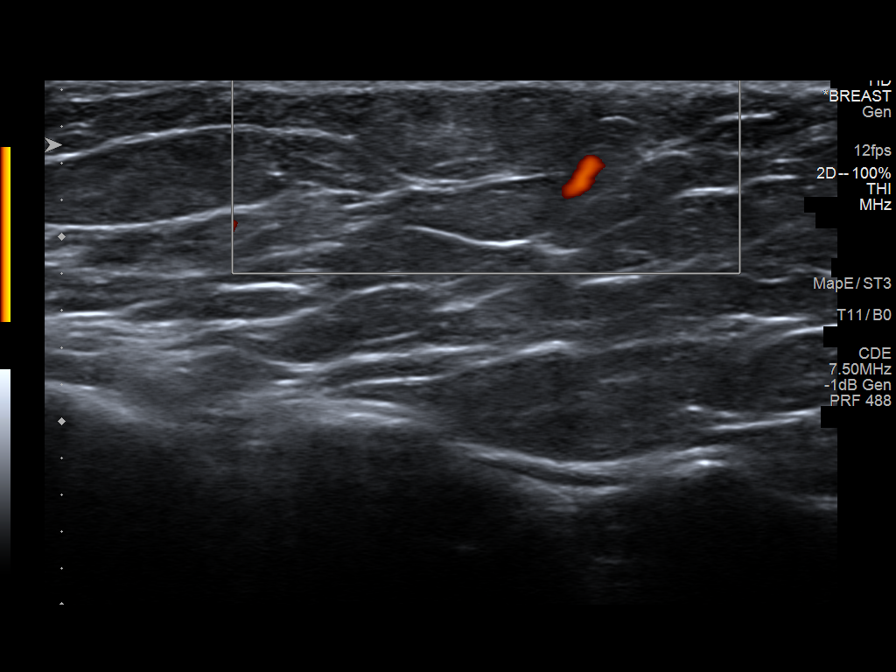

[7 of 7 positions shown; findings below may reference images not displayed]

ACR Breast Density Category b: There are scattered areas of
fibroglandular density.
FINDINGS: A BB has been placed along the medial aspect of the left breast
indicating the palpable site of concern. There are no suspicious
mammographic findings deep to the marker. No suspicious
calcifications, masses or areas of distortion are seen in the left
breast.

Mammographic images were processed with CAD.

Physical exam of the palpable site in the medial left breast
demonstrates a soft superficial mobile palpable lump.

Ultrasound targeted to the left breast at 9 o'clock, 12 cm from the
nipple demonstrates an isoechoic oval mass measuring 1.3 x 0.5 x
cm. This either represents a small lipoma or a normal fat lobule.
IMPRESSION: 1. The palpable lump in the left breast either represents a benign
lipoma or a normal fat lobule.

2.  No evidence of left breast malignancy.

RECOMMENDATION:
Return to routine screening mammography is recommended. The patient
will be due for screening in [DATE].

I have discussed the findings and recommendations with the patient.
If applicable, a reminder letter will be sent to the patient
regarding the next appointment.

BI-RADS CATEGORY  1: Negative.

## 2020-10-21 DIAGNOSIS — K6282 Dysplasia of anus: Secondary | ICD-10-CM | POA: Diagnosis not present

## 2020-12-14 ENCOUNTER — Ambulatory Visit: Payer: Medicare HMO

## 2020-12-14 DIAGNOSIS — H04123 Dry eye syndrome of bilateral lacrimal glands: Secondary | ICD-10-CM | POA: Diagnosis not present

## 2020-12-14 DIAGNOSIS — H33301 Unspecified retinal break, right eye: Secondary | ICD-10-CM | POA: Diagnosis not present

## 2020-12-14 DIAGNOSIS — H2513 Age-related nuclear cataract, bilateral: Secondary | ICD-10-CM | POA: Diagnosis not present

## 2020-12-14 DIAGNOSIS — H5203 Hypermetropia, bilateral: Secondary | ICD-10-CM | POA: Diagnosis not present

## 2020-12-16 DIAGNOSIS — M1612 Unilateral primary osteoarthritis, left hip: Secondary | ICD-10-CM | POA: Diagnosis not present

## 2020-12-24 DIAGNOSIS — M25552 Pain in left hip: Secondary | ICD-10-CM | POA: Diagnosis not present

## 2021-01-15 DIAGNOSIS — I10 Essential (primary) hypertension: Secondary | ICD-10-CM | POA: Diagnosis not present

## 2021-01-15 DIAGNOSIS — Z6828 Body mass index (BMI) 28.0-28.9, adult: Secondary | ICD-10-CM | POA: Diagnosis not present

## 2021-01-21 ENCOUNTER — Other Ambulatory Visit: Payer: Self-pay | Admitting: Neurological Surgery

## 2021-01-21 DIAGNOSIS — M5416 Radiculopathy, lumbar region: Secondary | ICD-10-CM

## 2021-02-04 ENCOUNTER — Ambulatory Visit: Payer: Medicare HMO | Admitting: Nurse Practitioner

## 2021-02-05 ENCOUNTER — Ambulatory Visit
Admission: RE | Admit: 2021-02-05 | Discharge: 2021-02-05 | Disposition: A | Discharge status: Home / Self Care | Payer: Medicare HMO | Source: Ambulatory Visit | Attending: Obstetrics and Gynecology | Admitting: Obstetrics and Gynecology

## 2021-02-05 ENCOUNTER — Other Ambulatory Visit: Payer: Self-pay

## 2021-02-05 DIAGNOSIS — Z1231 Encounter for screening mammogram for malignant neoplasm of breast: Secondary | ICD-10-CM | POA: Diagnosis not present

## 2021-02-05 IMAGING — MG MM DIGITAL SCREENING BILAT W/ TOMO AND CAD
8 series · 8 of 24 positions shown · non-contrast
Comparison: Previous exam(s).

CLINICAL DATA: Screening.

EXAM:
DIGITAL SCREENING BILATERAL MAMMOGRAM WITH TOMOSYNTHESIS AND CAD
TECHNIQUE: Bilateral screening digital craniocaudal and mediolateral oblique
mammograms were obtained. Bilateral screening digital breast
tomosynthesis was performed. The images were evaluated with
computer-aided detection.

[R CC synth-2D]
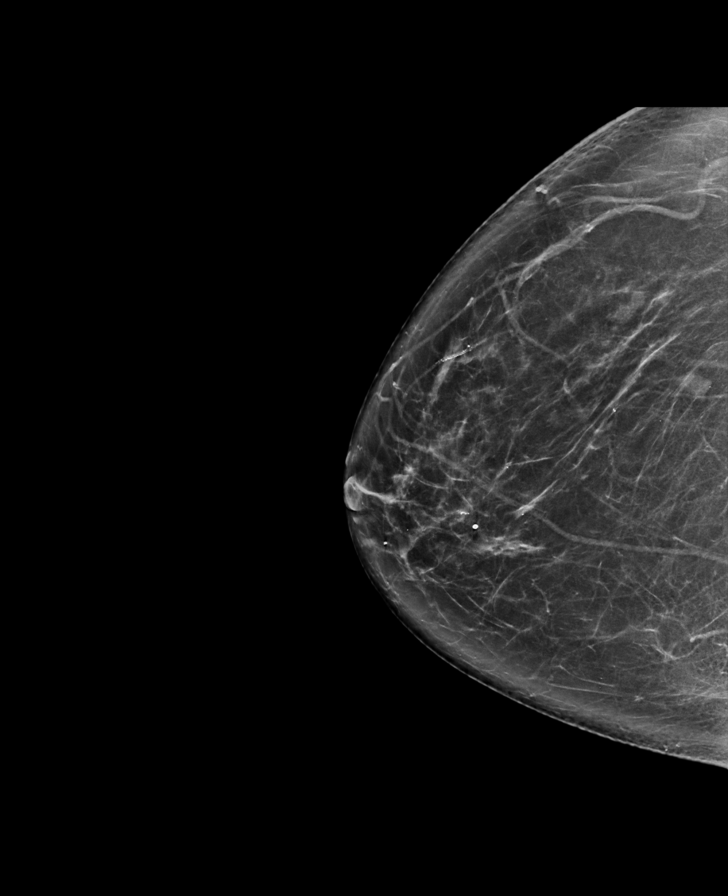

[L CC synth-2D]
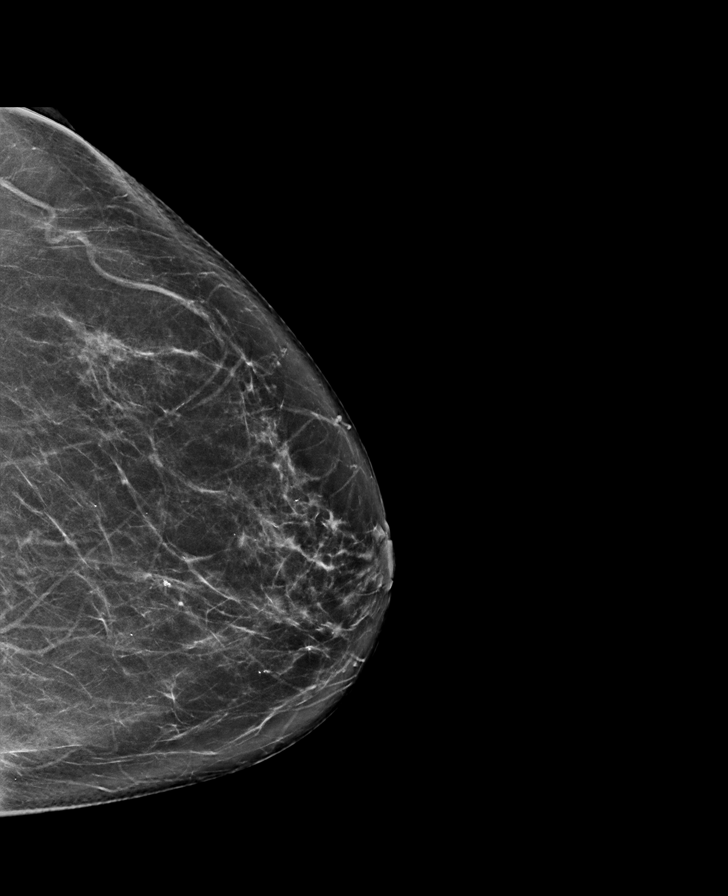

[L MLO synth-2D]
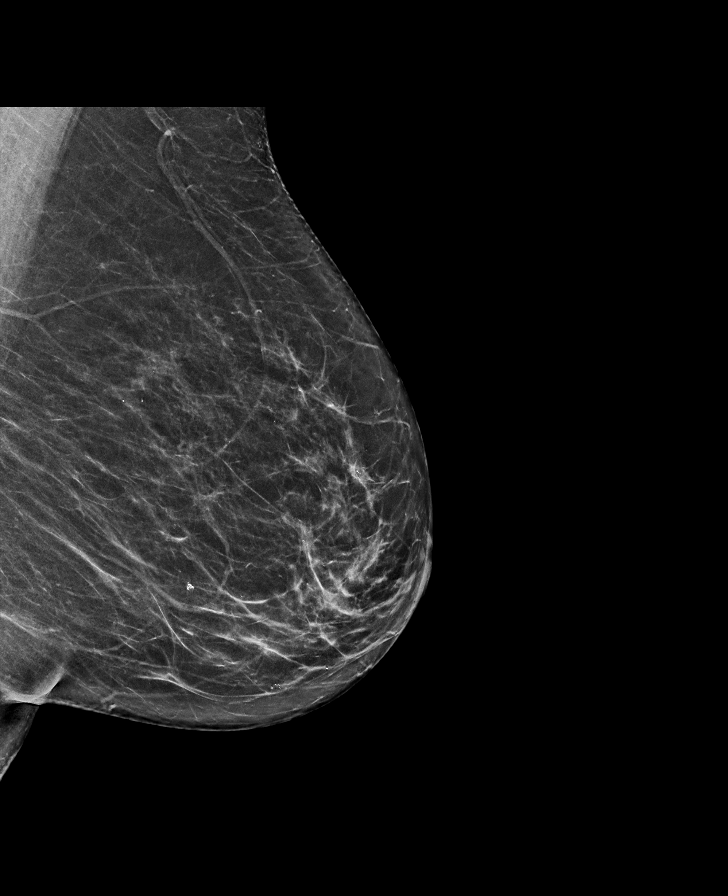

[R MLO synth-2D]
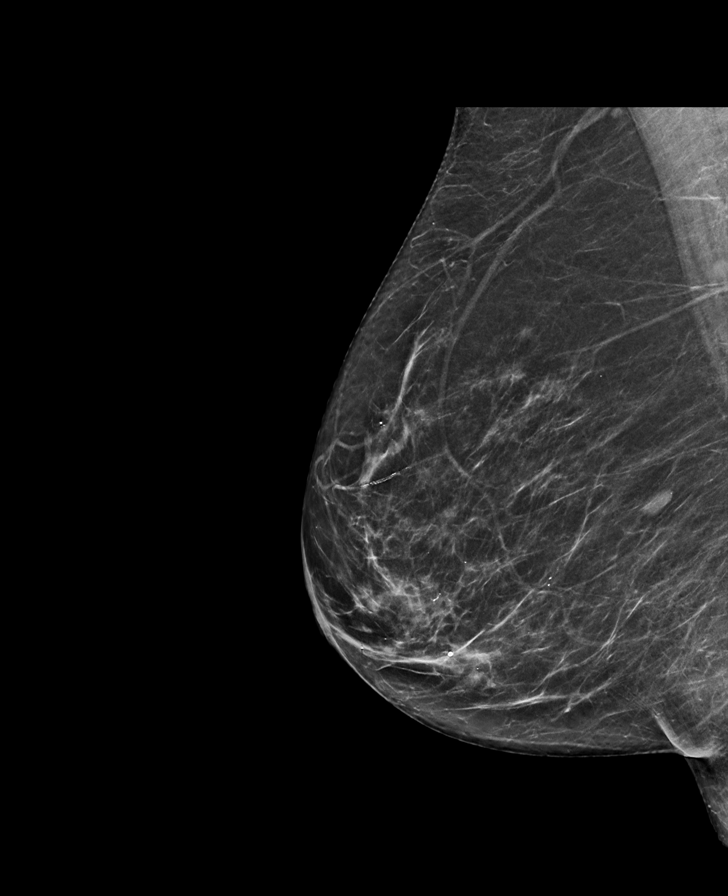

[L MLO tomo · tomo slice 35/69.0]
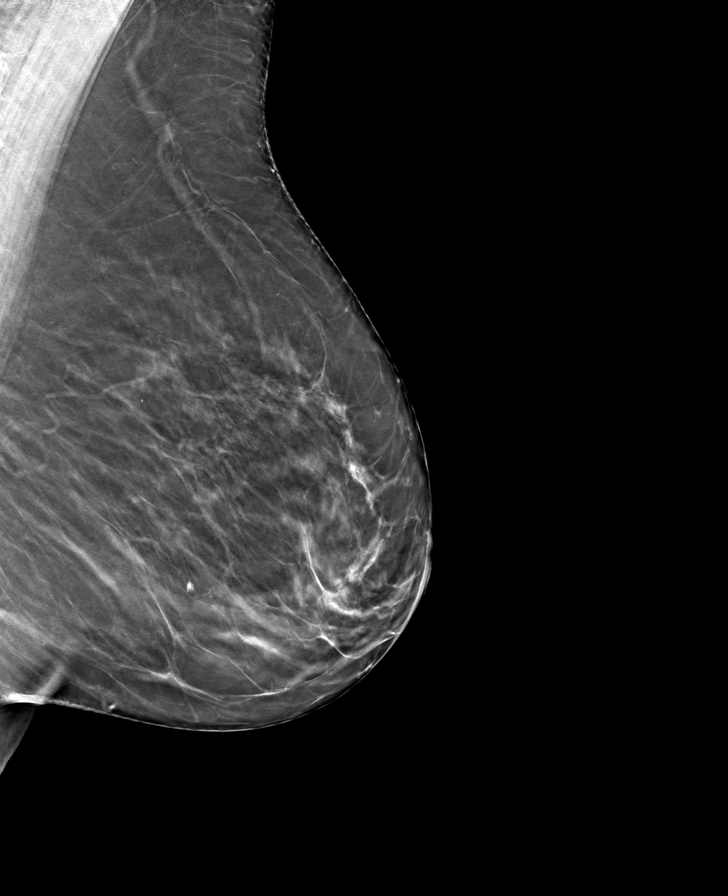

[R CC tomo · tomo slice 39/76.0]
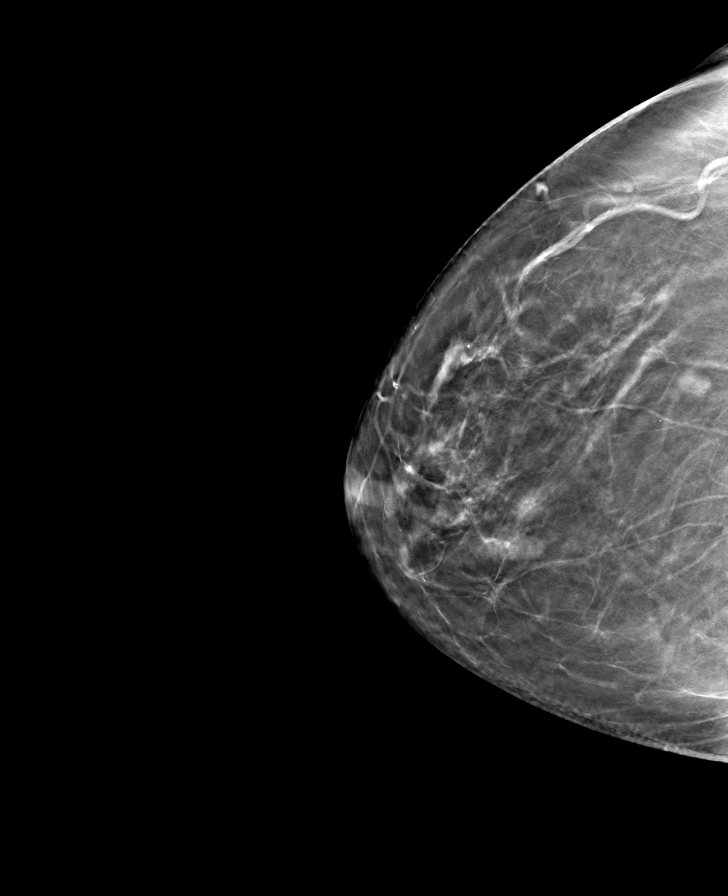

[L CC tomo · tomo slice 37/74.0]
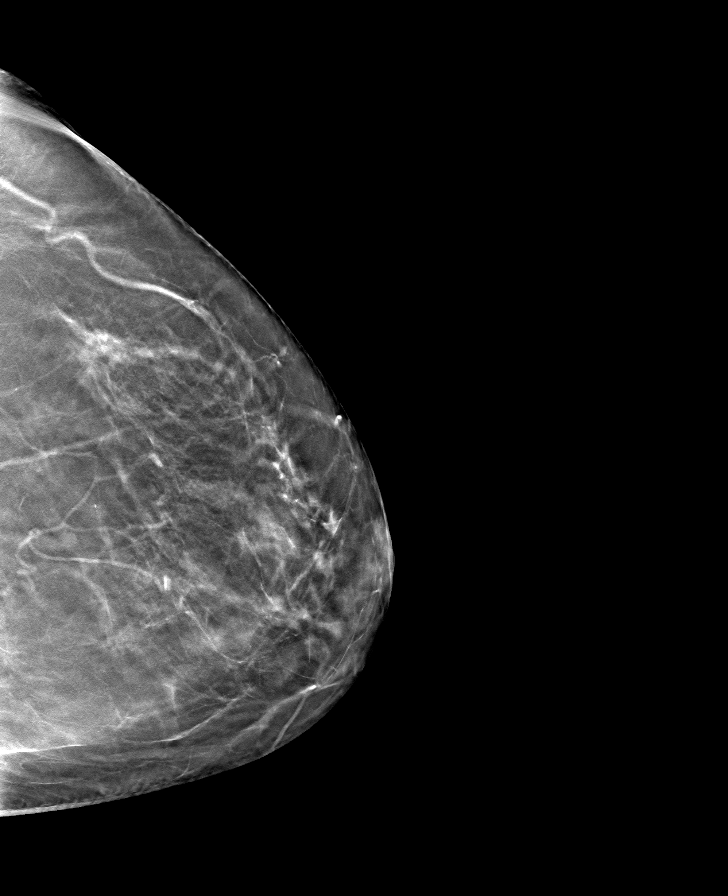

[R MLO tomo · tomo slice 34/67.0]
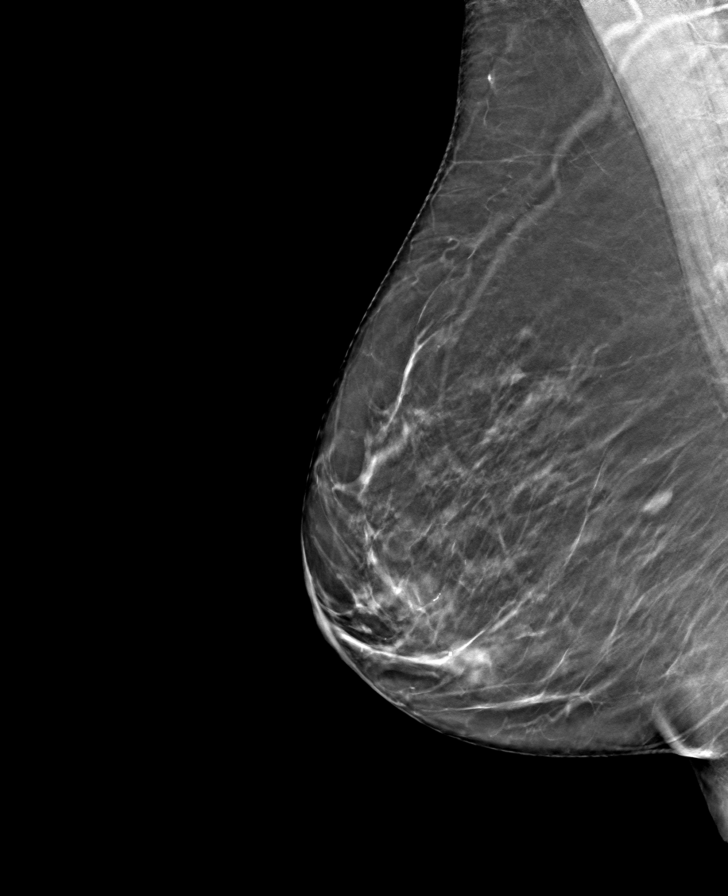

[8 of 24 positions shown; findings below may reference images not displayed]

ACR Breast Density Category b: There are scattered areas of
fibroglandular density.
FINDINGS: There are no findings suspicious for malignancy.
IMPRESSION: No mammographic evidence of malignancy. A result letter of this
screening mammogram will be mailed directly to the patient.

RECOMMENDATION:
Screening mammogram in one year. (Code:[BY])

BI-RADS CATEGORY  1: Negative.

## 2021-02-07 ENCOUNTER — Other Ambulatory Visit: Payer: Self-pay

## 2021-02-07 ENCOUNTER — Ambulatory Visit
Admission: RE | Admit: 2021-02-07 | Discharge: 2021-02-07 | Disposition: A | Discharge status: Home / Self Care | Payer: Medicare HMO | Source: Ambulatory Visit | Attending: Neurological Surgery | Admitting: Neurological Surgery

## 2021-02-07 DIAGNOSIS — M5416 Radiculopathy, lumbar region: Secondary | ICD-10-CM

## 2021-02-07 DIAGNOSIS — M48061 Spinal stenosis, lumbar region without neurogenic claudication: Secondary | ICD-10-CM | POA: Diagnosis not present

## 2021-02-07 DIAGNOSIS — M5116 Intervertebral disc disorders with radiculopathy, lumbar region: Secondary | ICD-10-CM | POA: Diagnosis not present

## 2021-02-07 IMAGING — MR MR LUMBAR SPINE WO/W CM
4 of 7 series · 25 of 48 positions shown · IV contrast (multihance)
Comparison: None.

CLINICAL DATA: Low back pain radiating to the right leg

EXAM:
MRI LUMBAR SPINE WITHOUT AND WITH CONTRAST
TECHNIQUE: Multiplanar and multiecho pulse sequences of the lumbar spine were
obtained without and with intravenous contrast.
CONTRAST:  17mL MULTIHANCE GADOBENATE DIMEGLUMINE 529 MG/ML IV SOLN

[Series 3: T2 · sagittal · 4.0mm · 0.53mm/px · 5 of 16 slices shown (1 of 2)]
[im 1/16]
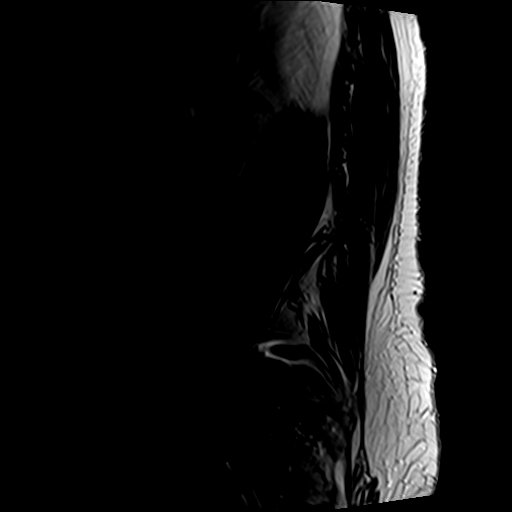
[im 4/16]
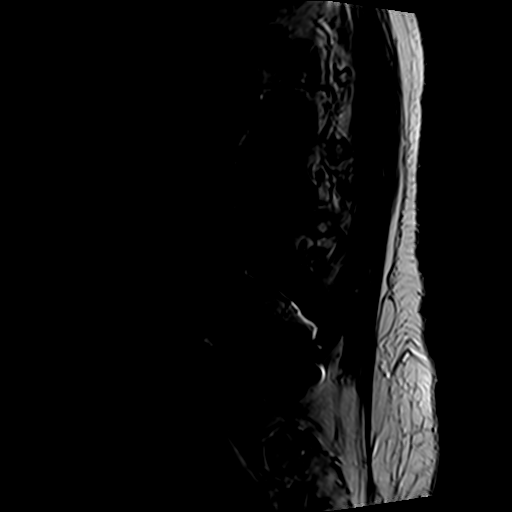
[im 8/16]
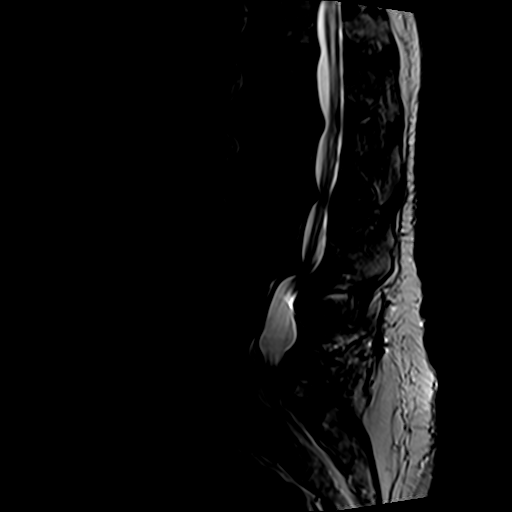
[im 12/16]
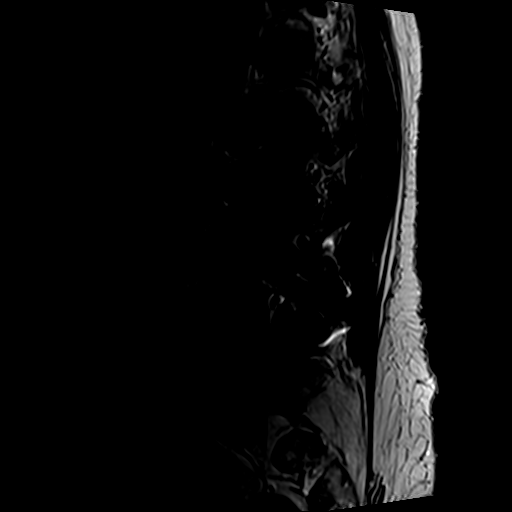
[im 16/16]
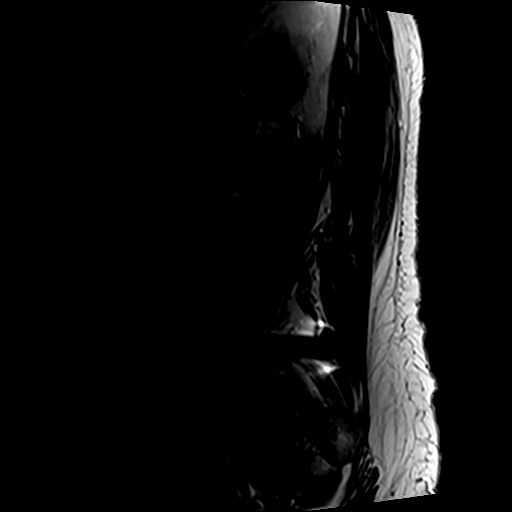

[Series 5: T1 · sagittal · 4.0mm · 0.53mm/px · 4 of 16 slices shown (1 of 2)]
[im 1/16]
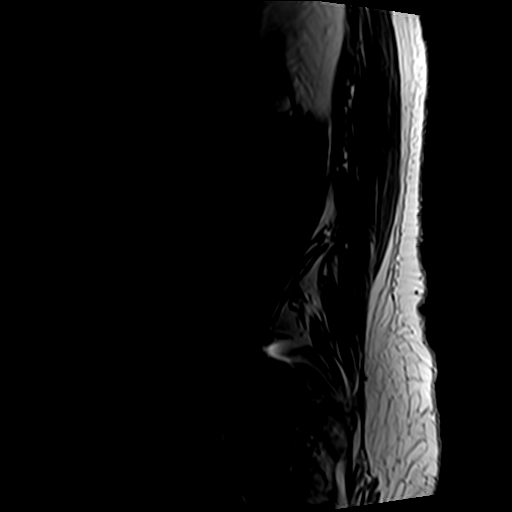
[im 6/16]
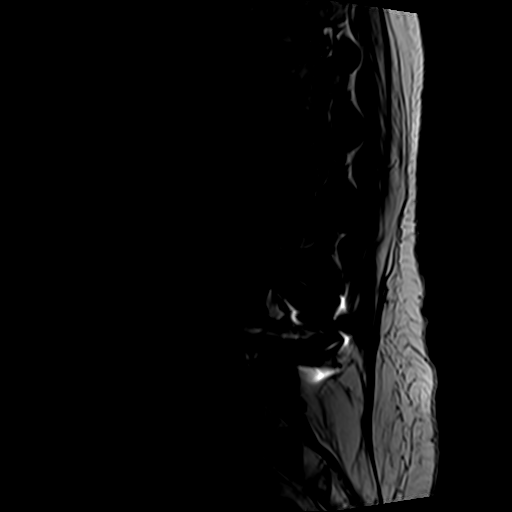
[im 11/16]
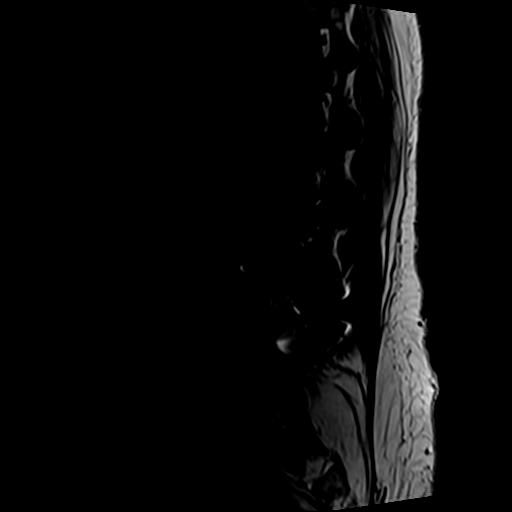
[im 16/16]
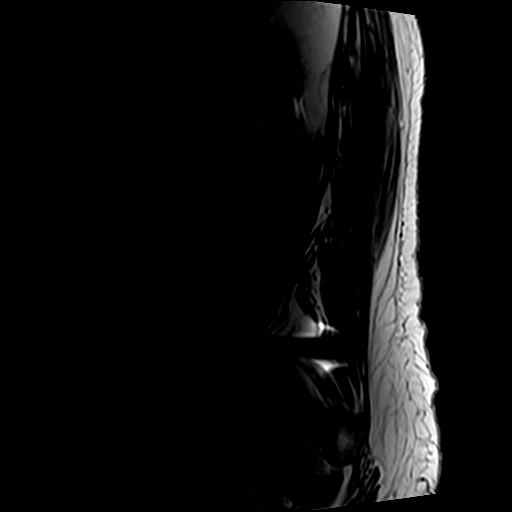

[Series 6: T2 · axial · 4.0mm · 0.70mm/px · z∈[+6,+204]mm · 8 of 39 slices shown (2 of 2)]
[im 1/39]
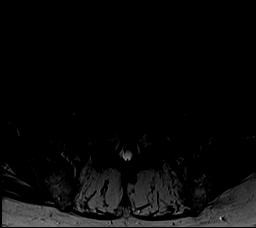
[im 5/39]
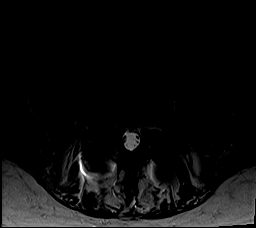
[im 13/39]
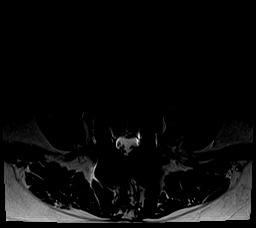
[im 17/39]
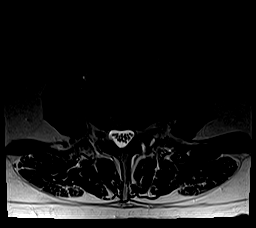
[im 22/39]
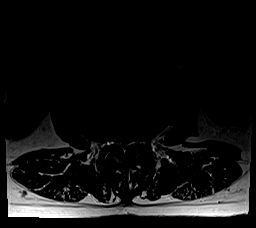
[im 26/39]
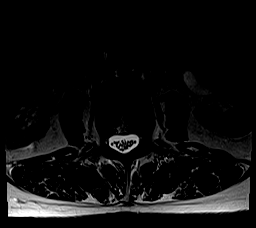
[im 34/39]
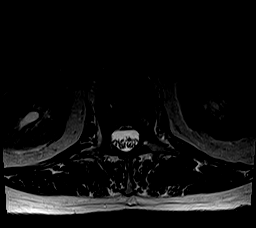
[im 39/39]
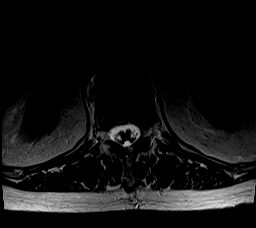

[Series 7: T1 · axial · 4.0mm · 0.35mm/px · z∈[+6,+204]mm · 8 of 39 slices shown (2 of 2)]
[im 1/39]
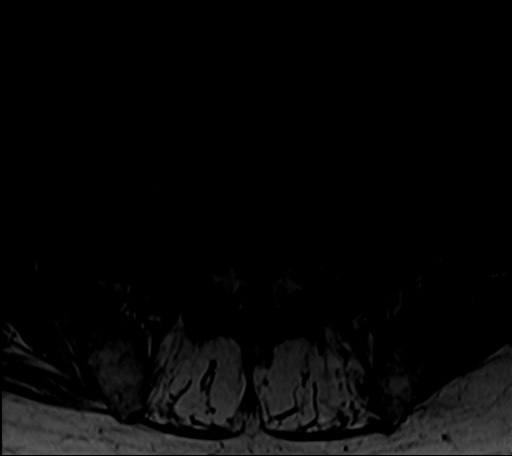
[im 5/39]
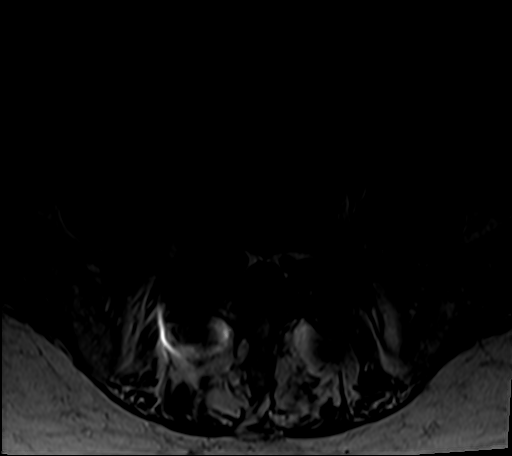
[im 13/39]
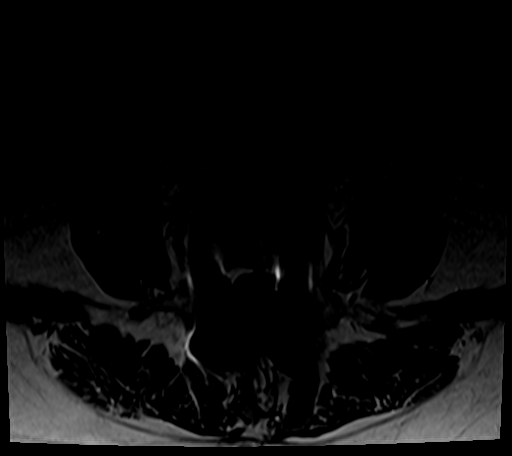
[im 17/39]
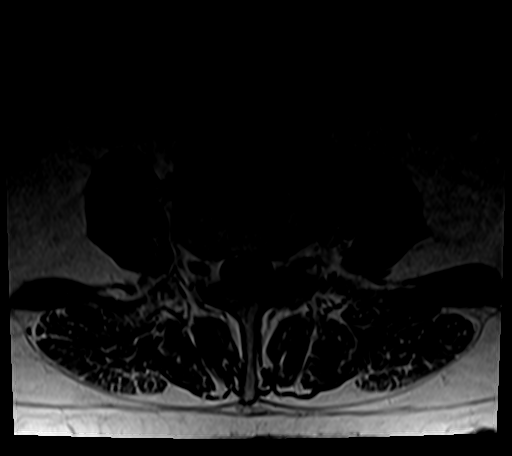
[im 22/39]
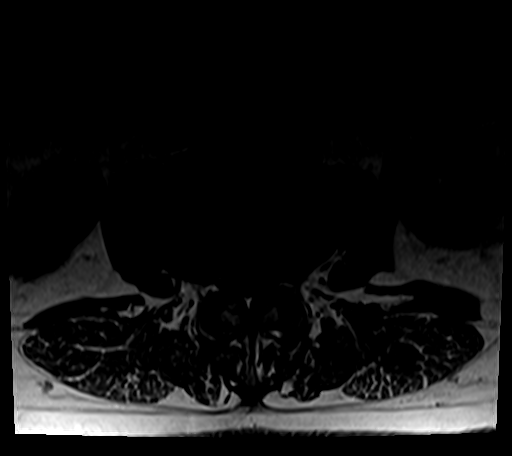
[im 26/39]
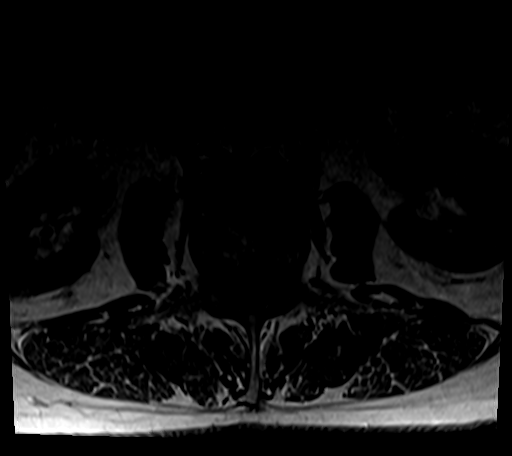
[im 34/39]
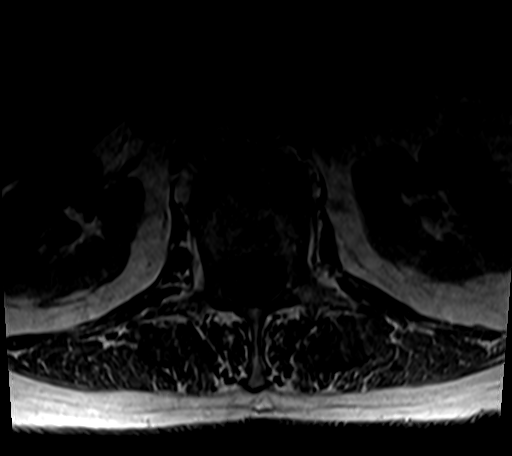
[im 39/39]
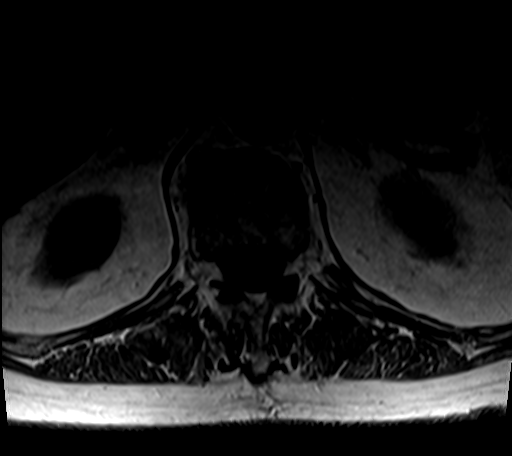

[25 of 48 positions shown; findings below may reference images not displayed]

FINDINGS: Segmentation:  Standard

Alignment:  Grade 1 retrolisthesis at L3-4

Vertebrae:  L4-5 PLIF

Conus medullaris and cauda equina: Conus extends to the L1 level.
Conus and cauda equina appear normal.

Paraspinal and other soft tissues: Negative

Disc levels:

L1-L2: Small right subarticular disc protrusion. Right lateral
recess narrowing without central spinal canal stenosis. No neural
foraminal stenosis.

L2-L3: Large disc bulge, left asymmetric. Moderate facet
hypertrophy. Mild spinal canal stenosis. No neural foraminal
stenosis.

L3-L4: Large left asymmetric disc bulge. Mild facet hypertrophy.
Mild spinal canal stenosis. Mild bilateral neural foraminal
stenosis.

L4-L5: PLIF posterior decompression. No spinal canal stenosis. No
neural foraminal stenosis.

L5-S1: Moderate facet hypertrophy with right asymmetric disc bulge.
Right lateral recess narrowing without central spinal canal
stenosis. Severe right neural foraminal stenosis.

Visualized sacrum: Normal.
IMPRESSION: 1. L4-5 PLIF with no residual spinal canal or neural foraminal
stenosis.
2. Severe right L5-S1 neural foraminal stenosis.
3. Mild spinal canal stenosis at L2-3 and L3-4.

## 2021-02-07 MED ORDER — GADOBENATE DIMEGLUMINE 529 MG/ML IV SOLN
17.0000 mL | Freq: Once | INTRAVENOUS | Status: AC | PRN
  Administered 2021-02-07: 17 mL via INTRAVENOUS

## 2021-02-19 DIAGNOSIS — M5416 Radiculopathy, lumbar region: Secondary | ICD-10-CM | POA: Diagnosis not present

## 2021-03-01 DIAGNOSIS — M5116 Intervertebral disc disorders with radiculopathy, lumbar region: Secondary | ICD-10-CM | POA: Diagnosis not present

## 2021-03-01 DIAGNOSIS — M5416 Radiculopathy, lumbar region: Secondary | ICD-10-CM | POA: Diagnosis not present

## 2021-03-10 ENCOUNTER — Ambulatory Visit: Payer: Medicare HMO | Admitting: Internal Medicine

## 2021-04-14 DIAGNOSIS — M5416 Radiculopathy, lumbar region: Secondary | ICD-10-CM | POA: Diagnosis not present

## 2021-04-14 DIAGNOSIS — I10 Essential (primary) hypertension: Secondary | ICD-10-CM | POA: Diagnosis not present

## 2021-04-14 DIAGNOSIS — Z8249 Family history of ischemic heart disease and other diseases of the circulatory system: Secondary | ICD-10-CM | POA: Diagnosis not present

## 2021-04-14 DIAGNOSIS — Z823 Family history of stroke: Secondary | ICD-10-CM | POA: Diagnosis not present

## 2021-04-18 ENCOUNTER — Telehealth: Payer: Self-pay | Admitting: Internal Medicine

## 2021-04-18 DIAGNOSIS — R911 Solitary pulmonary nodule: Secondary | ICD-10-CM

## 2021-04-18 DIAGNOSIS — J4530 Mild persistent asthma, uncomplicated: Secondary | ICD-10-CM

## 2021-04-18 NOTE — Telephone Encounter (Signed)
[name]. Wants paxlovid for trip    PAXLOVID   Paxlovid (nirmatelvir 300/Ritonavir100) - BID x 5 days - for GFR >= 60  Paxlovid (nirmatelvir 150/ritonavir 100) - BID x 5 days - for GFR 30-60  Paxlovid - not recommended for GFR < 30   PLEASE CHECK MED LIST for the following issues. Please check the 2 different condition related to concomitant medications in alphabetical order  If Cynthia Padilla with DOB 1950/09/22 is on any of the following Strong CYP3A inhibtors - this patient Cynthia Padilla should withold these concomitant meds so they can start paxlovid stratight away. If taking any of these:  alfuzosin, amiodarone, clozapine, colchicine, dihydroergotamine, dronedarone, ergotamine, flecainide, lovastatin, lurasidone, methylergonovine, midazolam [oral], pethidine, pimozide, propafenone, propoxyphene, quinidine, ranolazine, sildenafil simvastatin, triazolam).   If   [name]  with dob [birth year] Is on any of these other strong CYP3A inducers then starting paxlovid should be delayed and the following meds should wash out first. These are dapalutamide, carbamazepine, phenobarbital, phenytoin, rifampin, St John's wort) - let me know immediately and we should delay starting paxlovid by some days even if he stops these medication.    [name]  OF FOLLOWING SIDE EFFECTS  Side effects - all < 5%  - skin rash (and veyr rare a conditon called TEN) - angiomedia  - myalgia - jaundice - high bP (1%) - loss of taste  - diarrhea

## 2021-04-19 NOTE — Telephone Encounter (Signed)
Pt will need to come to the office to have BMET drawn so we can see what GFR is to make sure it will be safe for pt to be prescribed Paxlovid.  Attempted to call pt to let her know this but unable to reach. Left message for her to return call.

## 2021-04-20 DIAGNOSIS — K045 Chronic apical periodontitis: Secondary | ICD-10-CM | POA: Diagnosis not present

## 2021-04-20 DIAGNOSIS — K1329 Other disturbances of oral epithelium, including tongue: Secondary | ICD-10-CM | POA: Diagnosis not present

## 2021-04-20 DIAGNOSIS — K1239 Other oral mucositis (ulcerative): Secondary | ICD-10-CM | POA: Diagnosis not present

## 2021-04-20 NOTE — Telephone Encounter (Signed)
Called and spoke with patient and her husband to let them know Dr. Golden Pop recs and that we need to obtain blood work before RX can be sent in. Patient expressed understanding. Order has been placed. Advised them to come in whenever they would like to get it done and to just let us know so we can keep eye out for results to send in RX. Nothing further needed at this time.

## 2021-04-20 NOTE — Telephone Encounter (Signed)
ATC pt LVM to call office back

## 2021-05-14 ENCOUNTER — Other Ambulatory Visit (INDEPENDENT_AMBULATORY_CARE_PROVIDER_SITE_OTHER): Payer: Medicare HMO

## 2021-05-14 DIAGNOSIS — J4530 Mild persistent asthma, uncomplicated: Secondary | ICD-10-CM

## 2021-05-14 DIAGNOSIS — R911 Solitary pulmonary nodule: Secondary | ICD-10-CM | POA: Diagnosis not present

## 2021-05-14 LAB — BASIC METABOLIC PANEL
BUN: 16 mg/dL (ref 6–23)
CO2: 32 mEq/L (ref 19–32)
Calcium: 9.6 mg/dL (ref 8.4–10.5)
Chloride: 98 mEq/L (ref 96–112)
Creatinine, Ser: 0.81 mg/dL (ref 0.40–1.20)
GFR: 72.6 mL/min (ref >60.00)
Glucose, Bld: 107 mg/dL — ABNORMAL HIGH (ref 70–99)
Potassium: 3.4 mEq/L — ABNORMAL LOW (ref 3.5–5.1)
Sodium: 137 mEq/L (ref 135–145)

## 2021-05-14 NOTE — Addendum Note (Signed)
Addended by: Suzzanne Cloud E on: 05/14/2021 11:24 AM   Modules accepted: Orders

## 2021-05-18 ENCOUNTER — Emergency Department (HOSPITAL_BASED_OUTPATIENT_CLINIC_OR_DEPARTMENT_OTHER)
Admission: EM | Admit: 2021-05-18 | Discharge: 2021-05-18 | Disposition: A | Discharge status: Home / Self Care | Payer: Medicare HMO | Attending: Emergency Medicine | Admitting: Emergency Medicine

## 2021-05-18 ENCOUNTER — Emergency Department (HOSPITAL_BASED_OUTPATIENT_CLINIC_OR_DEPARTMENT_OTHER): Payer: Medicare HMO

## 2021-05-18 ENCOUNTER — Encounter (HOSPITAL_BASED_OUTPATIENT_CLINIC_OR_DEPARTMENT_OTHER): Payer: Self-pay | Admitting: Emergency Medicine

## 2021-05-18 ENCOUNTER — Other Ambulatory Visit: Payer: Self-pay

## 2021-05-18 DIAGNOSIS — M545 Low back pain, unspecified: Secondary | ICD-10-CM | POA: Diagnosis not present

## 2021-05-18 DIAGNOSIS — R109 Unspecified abdominal pain: Secondary | ICD-10-CM

## 2021-05-18 DIAGNOSIS — K5730 Diverticulosis of large intestine without perforation or abscess without bleeding: Secondary | ICD-10-CM | POA: Diagnosis not present

## 2021-05-18 DIAGNOSIS — D72829 Elevated white blood cell count, unspecified: Secondary | ICD-10-CM | POA: Diagnosis not present

## 2021-05-18 DIAGNOSIS — R1011 Right upper quadrant pain: Secondary | ICD-10-CM | POA: Insufficient documentation

## 2021-05-18 DIAGNOSIS — Z79899 Other long term (current) drug therapy: Secondary | ICD-10-CM | POA: Insufficient documentation

## 2021-05-18 DIAGNOSIS — R10A0 Flank pain, unspecified side: Secondary | ICD-10-CM

## 2021-05-18 DIAGNOSIS — I10 Essential (primary) hypertension: Secondary | ICD-10-CM | POA: Insufficient documentation

## 2021-05-18 DIAGNOSIS — E876 Hypokalemia: Secondary | ICD-10-CM | POA: Insufficient documentation

## 2021-05-18 DIAGNOSIS — J45909 Unspecified asthma, uncomplicated: Secondary | ICD-10-CM | POA: Diagnosis not present

## 2021-05-18 LAB — URINALYSIS, ROUTINE W REFLEX MICROSCOPIC
Bilirubin Urine: NEGATIVE
Glucose, UA: NEGATIVE mg/dL
Hgb urine dipstick: NEGATIVE
Ketones, ur: NEGATIVE mg/dL
Nitrite: NEGATIVE
Protein, ur: NEGATIVE mg/dL
Specific Gravity, Urine: 1.018 (ref 1.005–1.030)
pH: 6.5 (ref 5.0–8.0)

## 2021-05-18 LAB — COMPREHENSIVE METABOLIC PANEL
ALT: 15 U/L (ref 0–44)
AST: 20 U/L (ref 15–41)
Albumin: 4.5 g/dL (ref 3.5–5.0)
Alkaline Phosphatase: 58 U/L (ref 38–126)
Anion gap: 9 (ref 5–15)
BUN: 12 mg/dL (ref 8–23)
CO2: 30 mmol/L (ref 22–32)
Calcium: 10.1 mg/dL (ref 8.9–10.3)
Chloride: 98 mmol/L (ref 98–111)
Creatinine, Ser: 0.74 mg/dL (ref 0.44–1.00)
GFR, Estimated: >60 mL/min (ref >60)
Glucose, Bld: 114 mg/dL — ABNORMAL HIGH (ref 70–99)
Potassium: 3.4 mmol/L — ABNORMAL LOW (ref 3.5–5.1)
Sodium: 137 mmol/L (ref 135–145)
Total Bilirubin: 0.4 mg/dL (ref 0.3–1.2)
Total Protein: 7.4 g/dL (ref 6.5–8.1)

## 2021-05-18 LAB — CBC
HCT: 43.7 % (ref 36.0–46.0)
Hemoglobin: 14.5 g/dL (ref 12.0–15.0)
MCH: 31.3 pg (ref 26.0–34.0)
MCHC: 33.2 g/dL (ref 30.0–36.0)
MCV: 94.2 fL (ref 80.0–100.0)
Platelets: 331 10*3/uL (ref 150–400)
RBC: 4.64 MIL/uL (ref 3.87–5.11)
RDW: 12.5 % (ref 11.5–15.5)
WBC: 11.8 10*3/uL — ABNORMAL HIGH (ref 4.0–10.5)
nRBC: 0 % (ref 0.0–0.2)

## 2021-05-18 LAB — LIPASE, BLOOD: Lipase: 33 U/L (ref 11–51)

## 2021-05-18 IMAGING — CT CT ABD-PELV W/ CM
2 of 5 series · 17 of 46 positions shown, 19 images · IV contrast (omnipaque)
Comparison: [S3]

CLINICAL DATA: Epigastric pain

EXAM:
CT ABDOMEN AND PELVIS WITH CONTRAST
TECHNIQUE: Multidetector CT imaging of the abdomen and pelvis was performed
using the standard protocol following bolus administration of
intravenous contrast.
CONTRAST:  75mL OMNIPAQUE IOHEXOL 350 MG/ML SOLN

[Series 2: abd (person_name) · axial · 0.76mm/px · z∈[-533,-88]mm · 14 of 101 slices shown, 16 images]
[im 6/101  soft-tissue]
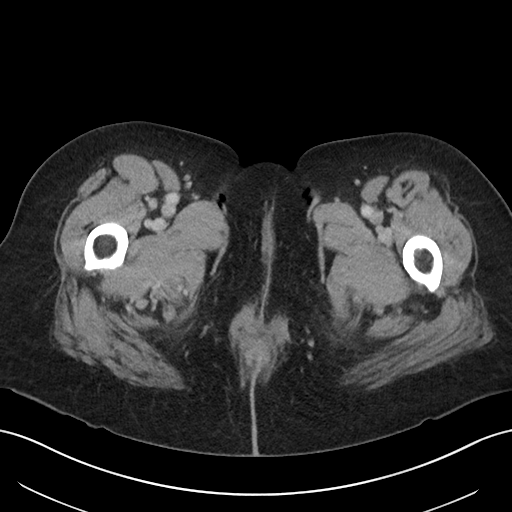
[im 6/101  bone]
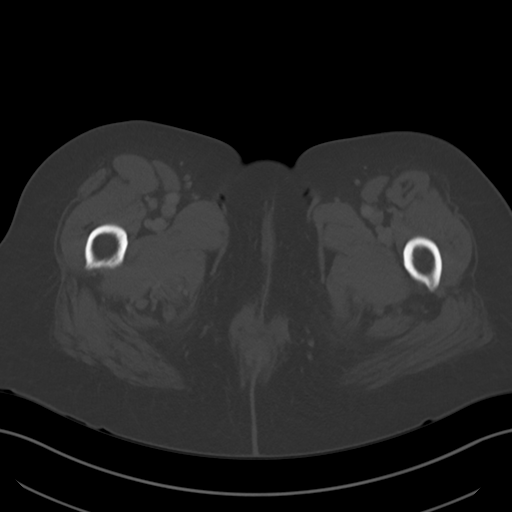
[im 11/101  soft-tissue]
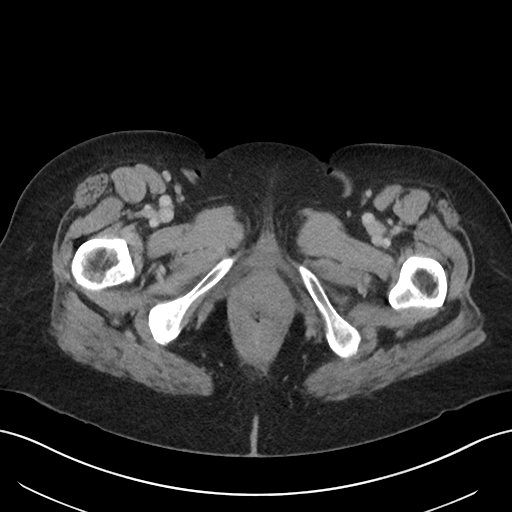
[im 22/101  soft-tissue]
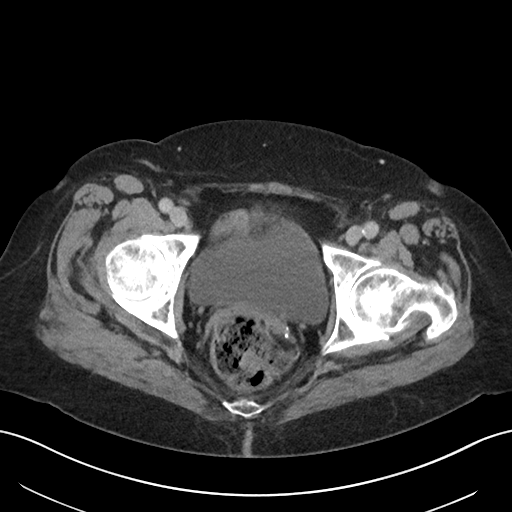
[im 27/101  soft-tissue]
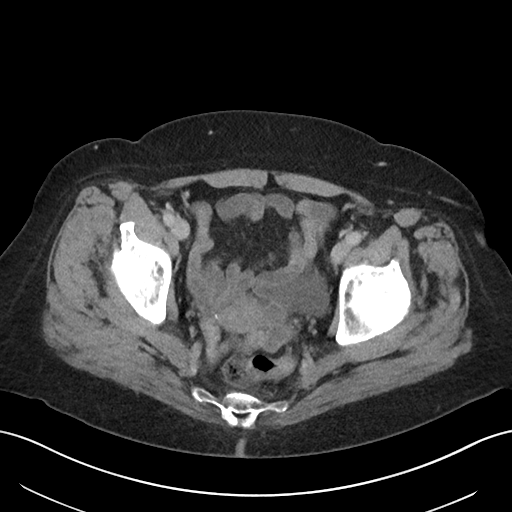
[im 32/101  soft-tissue]
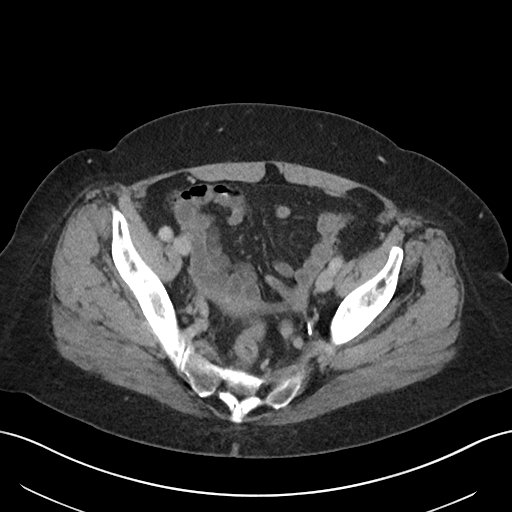
[im 43/101  soft-tissue]
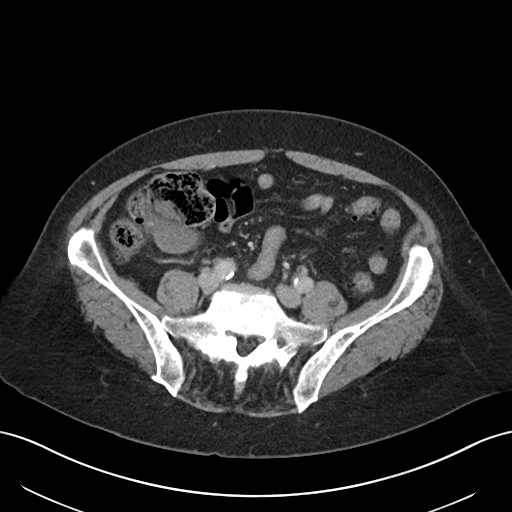
[im 48/101  soft-tissue]
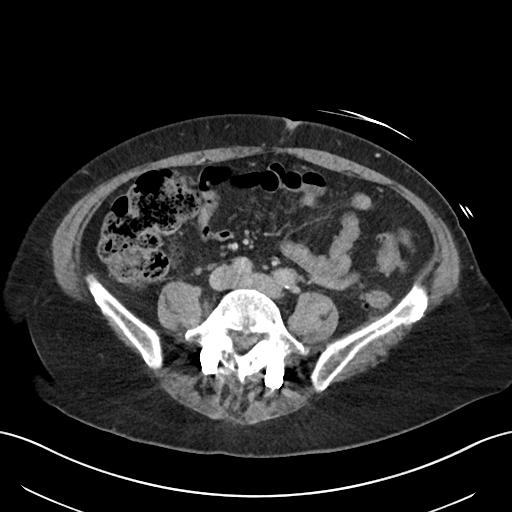
[im 53/101  soft-tissue]
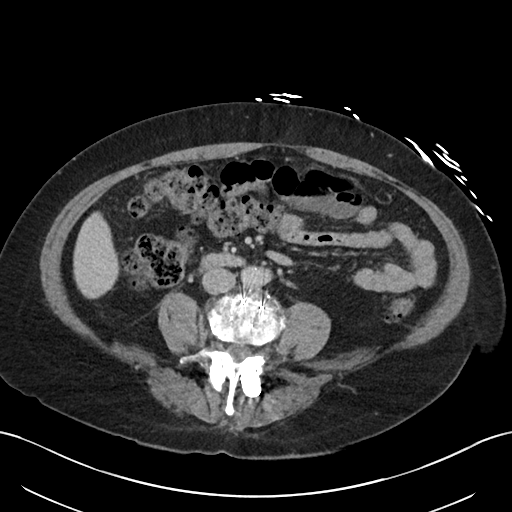
[im 58/101  soft-tissue]
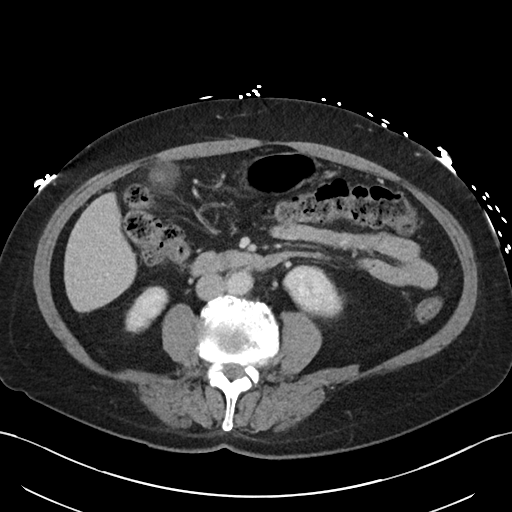
[im 58/101  bone]
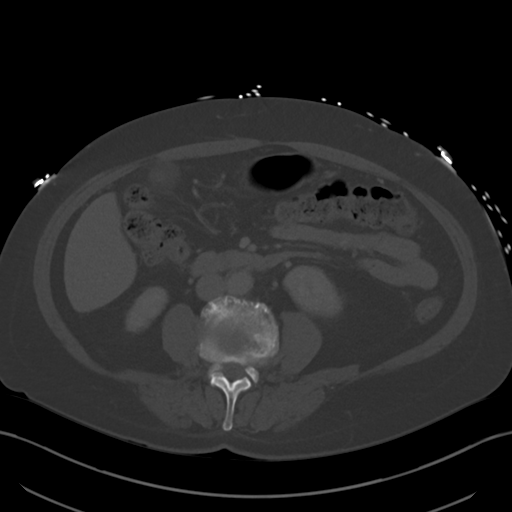
[im 69/101  soft-tissue]
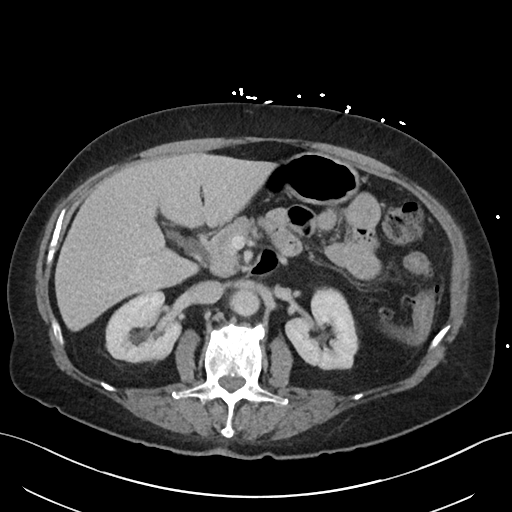
[im 74/101  soft-tissue]
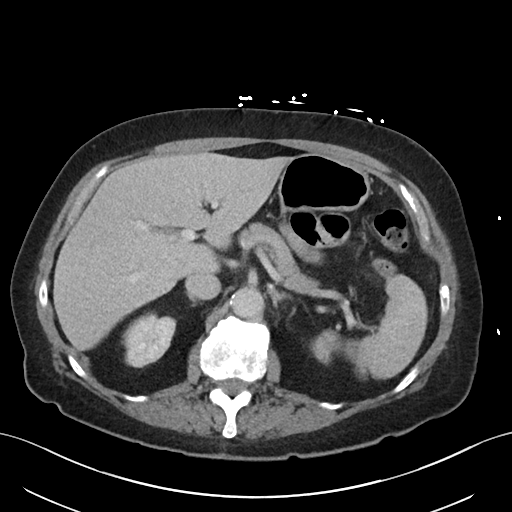
[im 79/101  soft-tissue]
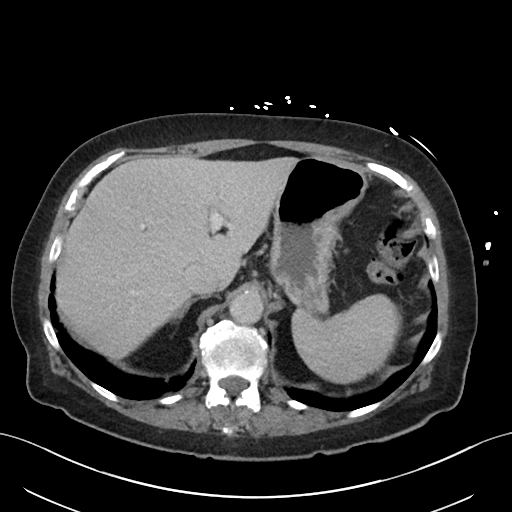
[im 90/101  soft-tissue]
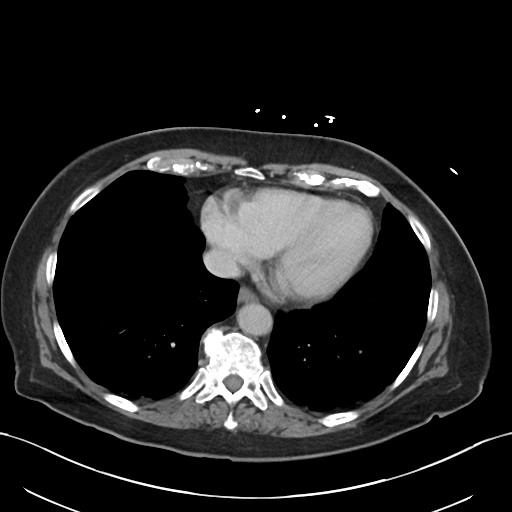
[im 95/101  soft-tissue]
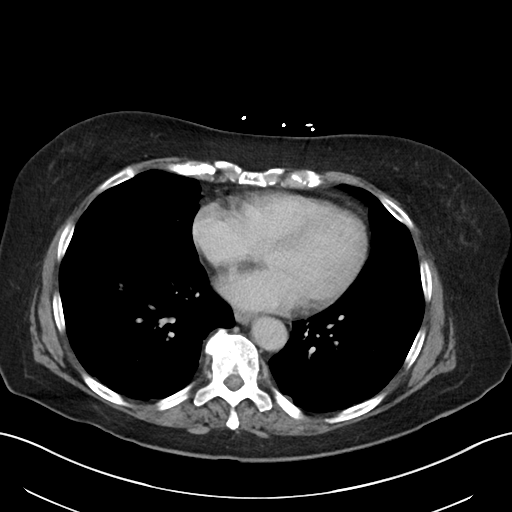

[Series 5: coronal (person_name) · coronal · 0.91mm/px · 3 of 97 slices shown]
[im 33/97  soft-tissue]
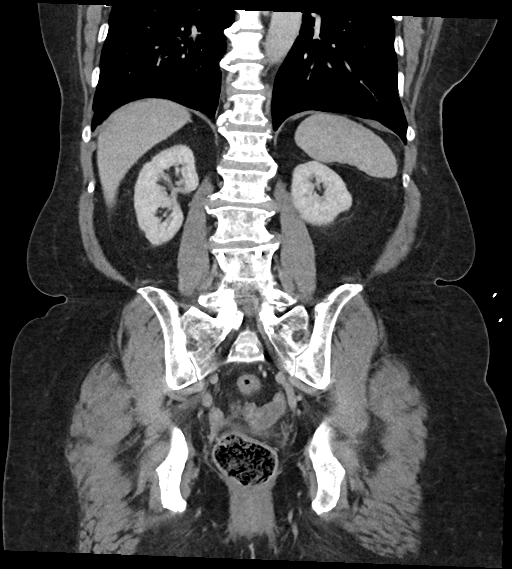
[im 43/97  soft-tissue]
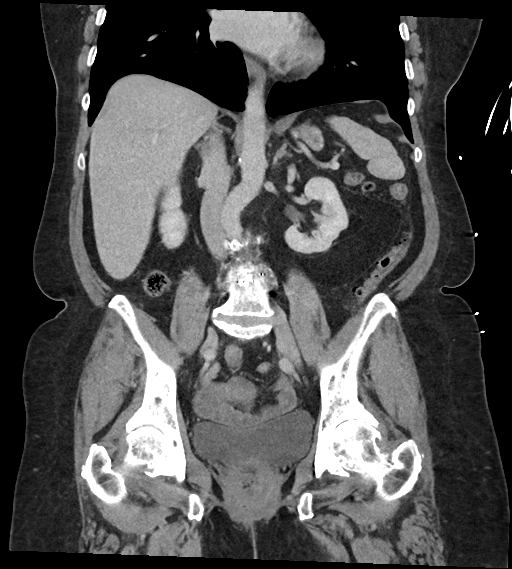
[im 54/97  soft-tissue]
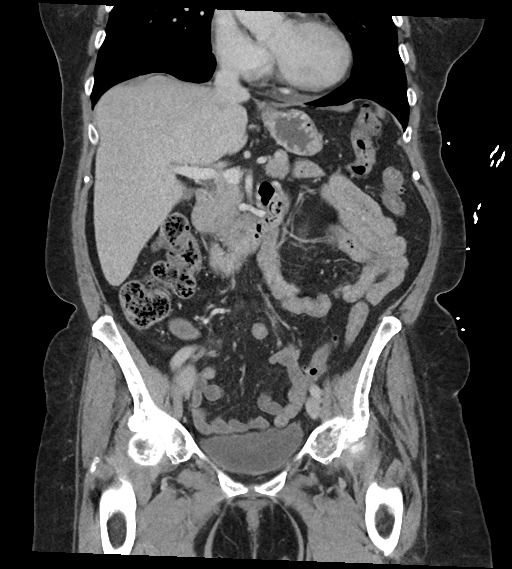

[17 of 46 positions shown; findings below may reference images not displayed]

FINDINGS: Lower chest: No acute abnormality.

Hepatobiliary: No focal liver abnormality is seen. No gallstones,
gallbladder wall thickening, or biliary dilatation.

Pancreas: Unremarkable. No pancreatic ductal dilatation or
surrounding inflammatory changes.

Spleen: Normal in size without focal abnormality.

Adrenals/Urinary Tract: Adrenals are unremarkable. Kidneys and
bladder are unremarkable.

Stomach/Bowel: Stomach is within normal limits. Bowel is normal in
caliber. Normal appendix. Distal colonic diverticulosis.

Vascular/Lymphatic: Aortic atherosclerosis. No enlarged lymph nodes.

Reproductive: Uterus and bilateral adnexa are unremarkable.

Other: No free fluid.  Abdominal wall is unremarkable.

Musculoskeletal: Postoperative changes at L4-L5. Degenerative
changes at other lumbar levels superimposed on congenital canal
narrowing.
IMPRESSION: No acute abnormality.

Colonic diverticulosis.

## 2021-05-18 MED ORDER — MORPHINE SULFATE (PF) 2 MG/ML IV SOLN
2.0000 mg | Freq: Once | INTRAVENOUS | Status: AC
  Administered 2021-05-18: 2 mg via INTRAVENOUS
  Filled 2021-05-18: qty 1

## 2021-05-18 MED ORDER — ONDANSETRON HCL 4 MG/2ML IJ SOLN
4.0000 mg | Freq: Once | INTRAMUSCULAR | Status: AC
  Administered 2021-05-18: 4 mg via INTRAVENOUS
  Filled 2021-05-18: qty 2

## 2021-05-18 MED ORDER — IOHEXOL 350 MG/ML SOLN
75.0000 mL | Freq: Once | INTRAVENOUS | Status: AC | PRN
  Administered 2021-05-18: 75 mL via INTRAVENOUS

## 2021-05-18 MED ORDER — LACTATED RINGERS IV SOLN: INTRAVENOUS | Status: DC

## 2021-05-18 MED ORDER — FENTANYL CITRATE PF 50 MCG/ML IJ SOSY
50.0000 ug | PREFILLED_SYRINGE | Freq: Once | INTRAMUSCULAR | Status: AC
  Administered 2021-05-18: 50 ug via INTRAVENOUS
  Filled 2021-05-18: qty 1

## 2021-05-18 MED ORDER — OXYCODONE-ACETAMINOPHEN 5-325 MG PO TABS
2.0000 | ORAL_TABLET | Freq: Once | ORAL | Status: AC
Start: 2021-05-18 — End: 2021-05-18
  Administered 2021-05-18: 2 via ORAL
  Filled 2021-05-18: qty 2

## 2021-05-18 NOTE — ED Triage Notes (Signed)
Pt arrives to ED with c/o of right sided flank pain that started yesterday afternoon. The pain is constant and burning. No polyuria, hematuria, or dysuria.

## 2021-05-18 NOTE — Discharge Instructions (Addendum)
Please use acetaminophen and ibuprofen as Discussed for pain Return for reevaluation if you have worsening symptoms especially fever, weakness on one side of the other, or worsening pain that does not resolve with treatment.

## 2021-05-18 NOTE — ED Provider Notes (Signed)
[facility] DEPT Provider Note   CSN: [id number] Arrival date & time: 05/18/21  9678     History Chief Complaint  Patient presents with   Flank Pain    Cynthia Padilla is a 70 y.o. female.  HPI 70 year old female presents today complaining of right flank pain.  States that this began gradually yesterday.  There is no injury or known precipitating factors.  The pain feels like a muscle spasm and has worsened from the right mid low back radiating around to the right side of her abdomen.  She has had some associated nausea but has not had any vomiting.  She was able to take protein drink today.  She feels like it is worse with moving but did did worsen when she attempts to lay on her left side last night.  She thought that she went to Pilates today and did some stretching would help but it worsened today and she came to the emergency department secondary to this.  She has had no fever, chills, cough, dyspnea, urinary tract infection symptoms, history of kidney stones, frequency of urination or pain with urination.  She took 2 ibuprofen last night without any significant relief.     Past Medical History:  Diagnosis Date   Anxiety    Arthritis    thinks she has some in her spine, bursitis in hips   Asthma    had it once when she had episode of bronchitis about 10 yrs ago   Diverticulitis    DIVERTICULOSIS, COLON 11/04/2004   Qualifier: Diagnosis of  By: Hardin Negus CMA (AAMA), [place]     GERD (gastroesophageal reflux disease)    Hepatitis    thinks she had Hepatits as a child   History of colonic polyps    Hyperlipidemia    Hypertension    Internal hemorrhoids    PONV (postoperative nausea and vomiting)    states she gets deathly sick   Vertigo     Patient Active Problem List   Diagnosis Date Noted   Dizziness 03/06/2014   Chest discomfort 07/15/2013   Pulmonary nodule 04/02/2012   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     Family History  Problem Relation Age of Onset   Ovarian cancer Mother    Lung cancer Mother    Diabetes Father    Heart disease Father    Leukemia Father    Diabetes Sister        x 2   Rectal cancer Sister        anal   Diabetes Brother        x 3   Colon cancer Neg Hx    Throat cancer Neg Hx    Kidney disease Neg Hx    Anesthesia problems Neg Hx    Liver disease Neg Hx    Breast cancer Neg Hx     Social History   Tobacco Use   Smoking status: Never  Smokeless tobacco: Never  Vaping Use   Vaping Use: Never used  Substance Use Topics   Alcohol use: Yes    Alcohol/week: 3.0 standard drinks    Types: 3 Glasses of wine per week   Drug use: No    Home Medications Prior to Admission medications   Medication Sig Start Date End Date Taking? Authorizing Provider  Cholecalciferol 25 MCG (1000 UT) capsule Vitamin D3 25 mcg (1,000 unit) capsule  Take by oral route.   Yes [provider]  Coenzyme Q10 (COQ10) 150 MG CAPS 150 mg.   Yes [provider]  hydrochlorothiazide (HYDRODIURIL) 25 MG tablet Take 25 mg by mouth daily.  07/26/14  Yes [provider]  Polyethyl Glycol-Propyl Glycol (SYSTANE OP) Place 1 drop into both eyes daily as needed (dry eyes, allergies).   Yes [provider]  acetaminophen (TYLENOL) 500 MG tablet Take 1,000 mg by mouth daily as needed for headache.    [provider]  meclizine (ANTIVERT) 25 MG tablet Take 1 tablet (25 mg total) by mouth 3 (three) times daily as needed. Patient not  taking: Reported on 05/18/2021 03/06/14   Marin Olp, MD    Allergies    Atorvastatin, Rosuvastatin, and Rosuvastatin calcium  Review of Systems   Review of Systems  All other systems reviewed and are negative.  Physical Exam Updated Vital Signs BP (!) 145/76   Pulse (!) 54   Temp 98 F (36.7 C) (Oral)   Resp 12   Ht 1.727 m (5\' 8" )   Wt 84.6 kg   SpO2 100%   BMI 28.36 kg/m   Physical Exam Vitals and nursing note reviewed.  Constitutional:      Appearance: Normal appearance.  HENT:     Right Ear: External ear normal.     Left Ear: External ear normal.     Nose: Nose normal.     Mouth/Throat:     Pharynx: Oropharynx is clear.  Eyes:     Pupils: Pupils are equal, round, and reactive to light.  Cardiovascular:     Rate and Rhythm: Normal rate and regular rhythm.     Pulses: Normal pulses.     Heart sounds: Normal heart sounds.  Pulmonary:     Effort: Pulmonary effort is normal.     Breath sounds: Normal breath sounds.  Abdominal:     General: Abdomen is flat.     Palpations: Abdomen is soft.     Tenderness: There is abdominal tenderness.     Comments: Mild tenderness right mid abdomen to right upper abdomen  Musculoskeletal:        General: No swelling. Normal range of motion.     Cervical back: Normal range of motion.  Skin:    General: Skin is warm and dry.     Capillary Refill: Capillary refill takes less than 2 seconds.  Neurological:     General: No focal deficit present.     Mental Status: She is alert.  Psychiatric:        Mood and Affect: Mood normal.        Behavior: Behavior normal.    ED Results / Procedures / Treatments   Labs (all labs ordered are listed, but only abnormal results are displayed) Labs Reviewed  URINALYSIS, ROUTINE W REFLEX MICROSCOPIC - Abnormal; Notable for the following components:      Result Value   Leukocytes,Ua SMALL (*)    All other components within normal limits  CBC - Abnormal;  Notable for the following  components:   WBC 11.8 (*)    All other components within normal limits  COMPREHENSIVE METABOLIC PANEL - Abnormal; Notable for the following components:   Potassium 3.4 (*)    Glucose, Bld 114 (*)    All other components within normal limits  LIPASE, BLOOD    EKG None  Radiology CT ABDOMEN PELVIS W CONTRAST  Result Date: 05/18/2021 CLINICAL DATA:  Epigastric pain EXAM: CT ABDOMEN AND PELVIS WITH CONTRAST TECHNIQUE: Multidetector CT imaging of the abdomen and pelvis was performed using the standard protocol following bolus administration of intravenous contrast. CONTRAST:  40mL OMNIPAQUE IOHEXOL 350 MG/ML SOLN COMPARISON:  2018 FINDINGS: Lower chest: No acute abnormality. Hepatobiliary: No focal liver abnormality is seen. No gallstones, gallbladder wall thickening, or biliary dilatation. Pancreas: Unremarkable. No pancreatic ductal dilatation or surrounding inflammatory changes. Spleen: Normal in size without focal abnormality. Adrenals/Urinary Tract: Adrenals are unremarkable. Kidneys and bladder are unremarkable. Stomach/Bowel: Stomach is within normal limits. Bowel is normal in caliber. Normal appendix. Distal colonic diverticulosis. Vascular/Lymphatic: Aortic atherosclerosis. No enlarged lymph nodes. Reproductive: Uterus and bilateral adnexa are unremarkable. Other: No free fluid.  Abdominal wall is unremarkable. Musculoskeletal: Postoperative changes at L4-L5. 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 IMPRESSION: No acute abnormality. Colonic diverticulosis. Electronically Signed   By: Macy Mis M.D.   On: 05/18/2021 13:21    Procedures Procedures   Medications Ordered in ED Medications  lactated ringers infusion ( Intravenous New Bag/Given 05/18/21 1127)  morphine 2 MG/ML injection 2 mg (has no administration in time range)  fentaNYL (SUBLIMAZE) injection 50 mcg (50 mcg Intravenous Given 05/18/21 1127)  iohexol (OMNIPAQUE) 350 MG/ML  injection 75 mL (75 mLs Intravenous Contrast Given 05/18/21 1244)    ED Course  I have reviewed the triage vital signs and the nursing notes.  Pertinent labs & imaging results that were available during my care of the patient were reviewed by me and considered in my medical decision making (see chart for details).    MDM Rules/Calculators/A&P                           70 year old female who presents today complaining of right low back and flank pain.  Here on exam she has some mild diffuse tenderness throughout the low back and into the right flank area.  Labs showed mild hypokalemia at 3.4, mild leukocytosis at 11,800, urine reveals 0-5 red blood cells 0-5 squamous epithelial 0-5 white blood cells small leukocytes.  Patient has been orally repleted her potassium at home.  She has some history of degenerative disc disease.  I do not see any obvious severe changes in her lumbar spine although there are some degenerative changes superimposed on congenital narrowing.  Additionally she had some injections by Dr. Ellene Route back in July.  I have discussed my concerns for any spinal cord infection.  At this time, I do not think further investigation is needed given her relatively normal labs and CT results.  However, I did stressed with her that if she has worsening symptoms, fever, or neurologic symptoms she should return immediately to facility with MRI capability.  Patient is being treated here symptomatically for pain and appears to be stable for discharge. Final Clinical Impression(s) / ED Diagnoses Final diagnoses:  Flank pain    Rx / DC Orders ED Discharge Orders     None        Jedediah Noda, Andee Poles,  MD 05/18/21 1447

## 2021-05-20 ENCOUNTER — Other Ambulatory Visit: Payer: Self-pay

## 2021-05-20 ENCOUNTER — Emergency Department (HOSPITAL_COMMUNITY)
Admission: EM | Admit: 2021-05-20 | Discharge: 2021-05-20 | Disposition: A | Discharge status: Home / Self Care | Payer: Medicare HMO | Attending: Emergency Medicine | Admitting: Emergency Medicine

## 2021-05-20 ENCOUNTER — Emergency Department (HOSPITAL_COMMUNITY): Payer: Medicare HMO

## 2021-05-20 ENCOUNTER — Encounter (HOSPITAL_COMMUNITY): Payer: Self-pay

## 2021-05-20 DIAGNOSIS — I10 Essential (primary) hypertension: Secondary | ICD-10-CM | POA: Diagnosis not present

## 2021-05-20 DIAGNOSIS — M5459 Other low back pain: Secondary | ICD-10-CM | POA: Diagnosis not present

## 2021-05-20 DIAGNOSIS — R102 Pelvic and perineal pain: Secondary | ICD-10-CM | POA: Diagnosis present

## 2021-05-20 DIAGNOSIS — K219 Gastro-esophageal reflux disease without esophagitis: Secondary | ICD-10-CM | POA: Insufficient documentation

## 2021-05-20 DIAGNOSIS — M545 Low back pain, unspecified: Secondary | ICD-10-CM | POA: Diagnosis not present

## 2021-05-20 DIAGNOSIS — Z79899 Other long term (current) drug therapy: Secondary | ICD-10-CM | POA: Diagnosis not present

## 2021-05-20 DIAGNOSIS — J45909 Unspecified asthma, uncomplicated: Secondary | ICD-10-CM | POA: Diagnosis not present

## 2021-05-20 DIAGNOSIS — N390 Urinary tract infection, site not specified: Secondary | ICD-10-CM | POA: Diagnosis not present

## 2021-05-20 LAB — URINALYSIS, ROUTINE W REFLEX MICROSCOPIC
Bilirubin Urine: NEGATIVE
Glucose, UA: NEGATIVE mg/dL
Hgb urine dipstick: NEGATIVE
Ketones, ur: NEGATIVE mg/dL
Nitrite: NEGATIVE
Protein, ur: NEGATIVE mg/dL
Specific Gravity, Urine: 1.009 (ref 1.005–1.030)
pH: 6 (ref 5.0–8.0)

## 2021-05-20 LAB — CBC WITH DIFFERENTIAL/PLATELET
Abs Immature Granulocytes: 0.04 10*3/uL (ref 0.00–0.07)
Basophils Absolute: 0.1 10*3/uL (ref 0.0–0.1)
Basophils Relative: 1 %
Eosinophils Absolute: 0.1 10*3/uL (ref 0.0–0.5)
Eosinophils Relative: 1 %
HCT: 41.9 % (ref 36.0–46.0)
Hemoglobin: 14 g/dL (ref 12.0–15.0)
Immature Granulocytes: 0 %
Lymphocytes Relative: 34 %
Lymphs Abs: 3.5 10*3/uL (ref 0.7–4.0)
MCH: 31.7 pg (ref 26.0–34.0)
MCHC: 33.4 g/dL (ref 30.0–36.0)
MCV: 95 fL (ref 80.0–100.0)
Monocytes Absolute: 0.8 10*3/uL (ref 0.1–1.0)
Monocytes Relative: 8 %
Neutro Abs: 5.9 10*3/uL (ref 1.7–7.7)
Neutrophils Relative %: 56 %
Platelets: 317 10*3/uL (ref 150–400)
RBC: 4.41 MIL/uL (ref 3.87–5.11)
RDW: 12.3 % (ref 11.5–15.5)
WBC: 10.3 10*3/uL (ref 4.0–10.5)
nRBC: 0 % (ref 0.0–0.2)

## 2021-05-20 LAB — BASIC METABOLIC PANEL
Anion gap: 9 (ref 5–15)
BUN: 11 mg/dL (ref 8–23)
CO2: 28 mmol/L (ref 22–32)
Calcium: 9.1 mg/dL (ref 8.9–10.3)
Chloride: 94 mmol/L — ABNORMAL LOW (ref 98–111)
Creatinine, Ser: 0.67 mg/dL (ref 0.44–1.00)
GFR, Estimated: >60 mL/min (ref >60)
Glucose, Bld: 111 mg/dL — ABNORMAL HIGH (ref 70–99)
Potassium: 3.2 mmol/L — ABNORMAL LOW (ref 3.5–5.1)
Sodium: 131 mmol/L — ABNORMAL LOW (ref 135–145)

## 2021-05-20 IMAGING — MR MR LUMBAR SPINE WO/W CM
6 of 7 series · 31 of 48 positions shown · IV contrast (gadavist)
Comparison: [DATE]

CLINICAL DATA: Low back pain

EXAM:
MRI LUMBAR SPINE WITHOUT AND WITH CONTRAST
TECHNIQUE: Multiplanar and multiecho pulse sequences of the lumbar spine were
obtained without and with intravenous contrast.
CONTRAST:  9mL GADAVIST GADOBUTROL 1 MMOL/ML IV SOLN

[Series 5: T1 · sagittal · 4.0mm · 0.81mm/px · 5 of 17 slices shown (1 of 2)]
[im 1/17]
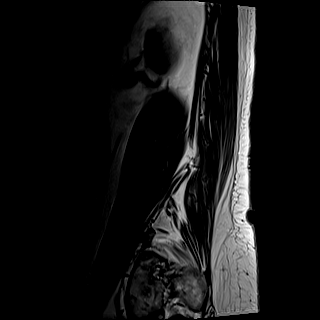
[im 5/17]
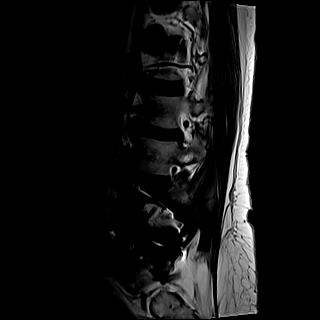
[im 9/17]
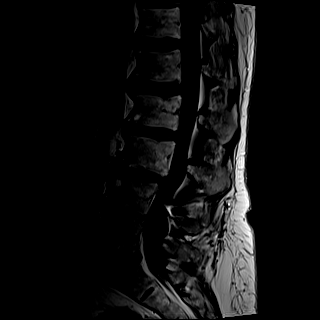
[im 13/17]
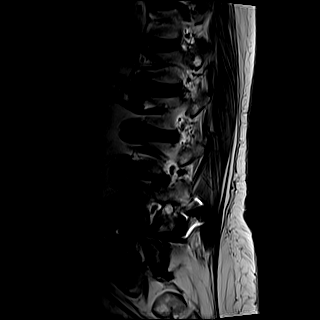
[im 17/17]
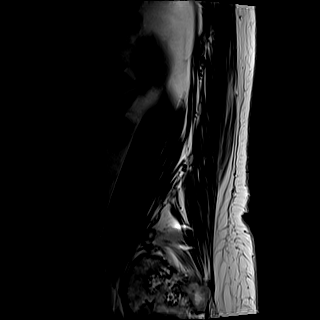

[Series 6: STIR · sagittal · 4.0mm · 0.51mm/px · 2 of 17 slices shown]
[im 1/17]
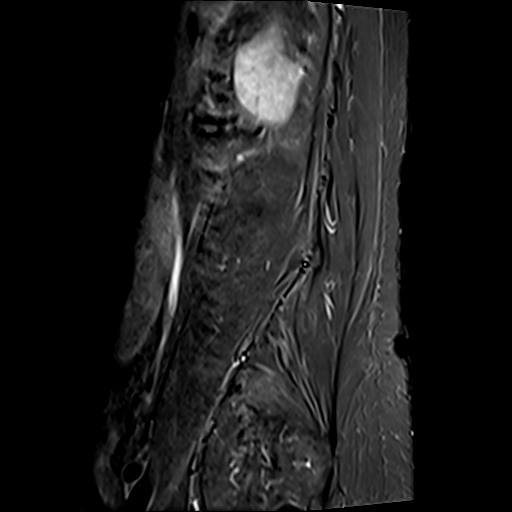
[im 5/17]
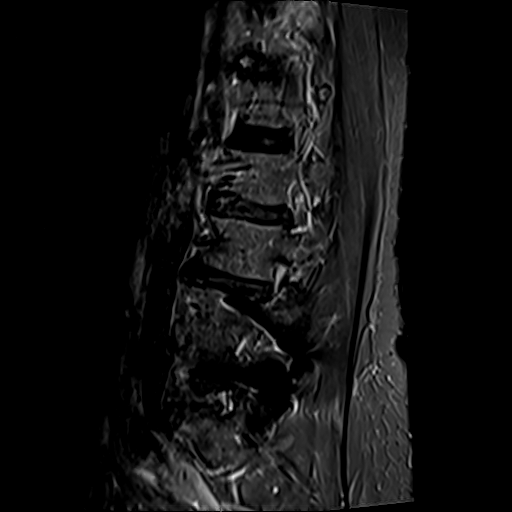

[Series 7: T2 · axial · 4.0mm · 0.62mm/px · z∈[-40,+174]mm · 8 of 43 slices shown]
[im 1/43]
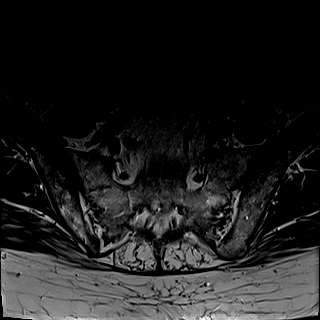
[im 5/43]
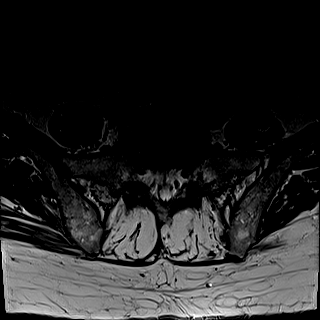
[im 15/43]
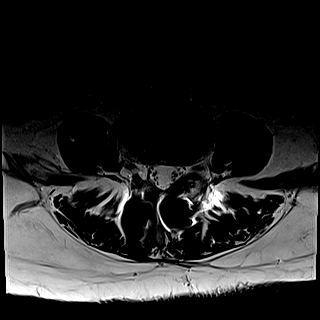
[im 19/43]
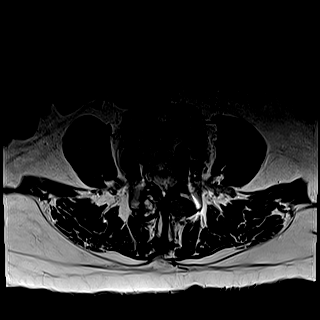
[im 24/43]
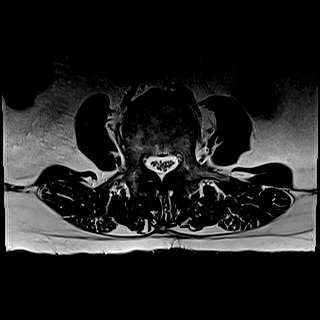
[im 29/43]
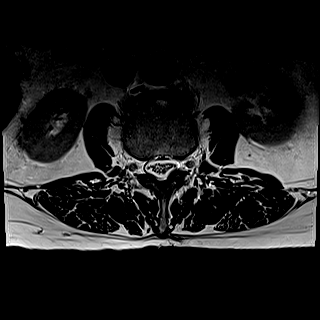
[im 38/43]
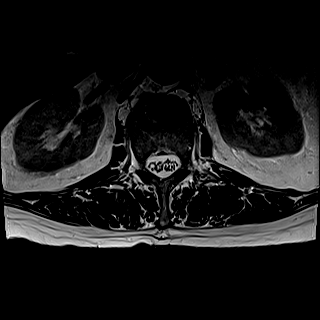
[im 43/43]
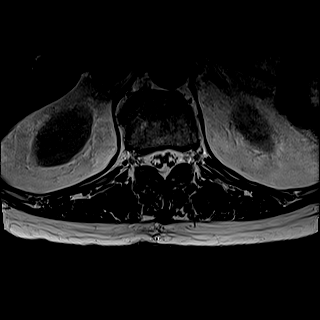

[Series 8: T1 · axial · 4.0mm · 0.39mm/px · z∈[-40,+174]mm · 8 of 43 slices shown (2 of 2)]
[im 1/43]
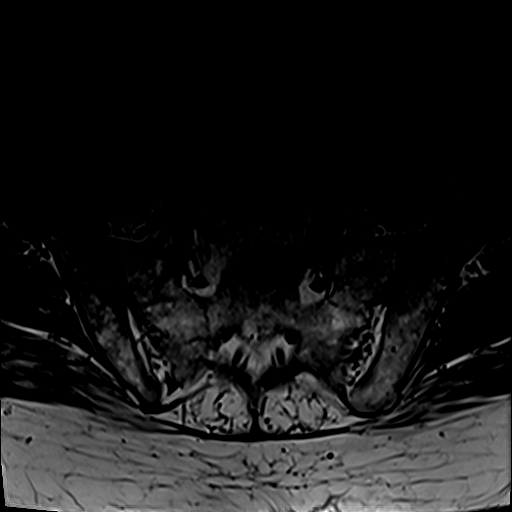
[im 5/43]
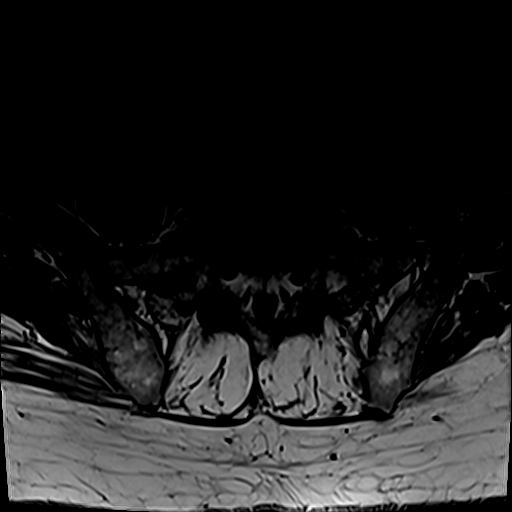
[im 15/43]
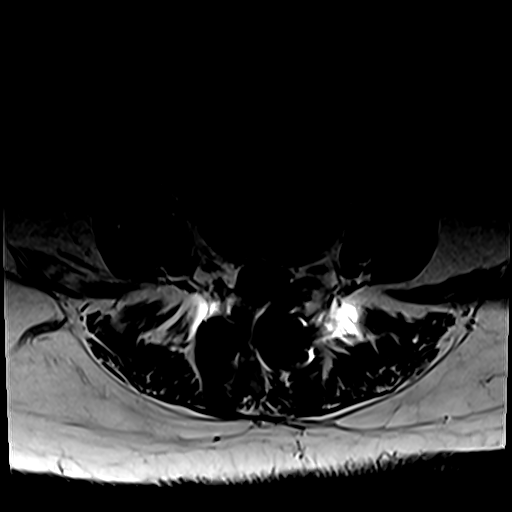
[im 19/43]
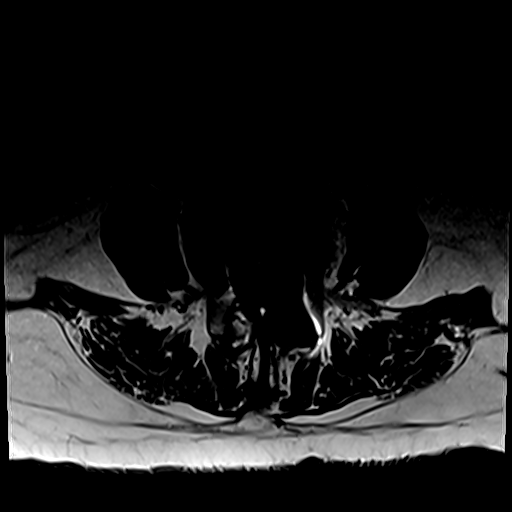
[im 24/43]
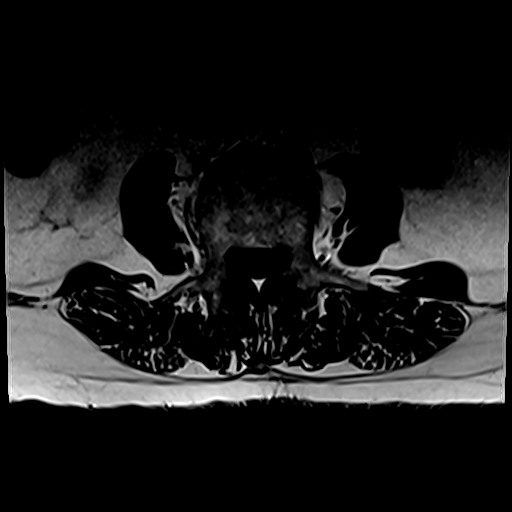
[im 29/43]
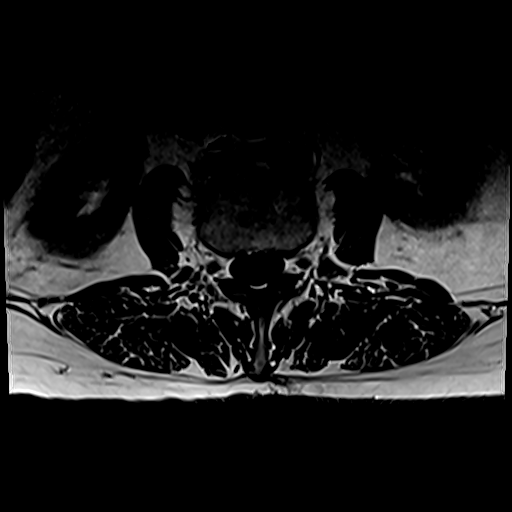
[im 38/43]
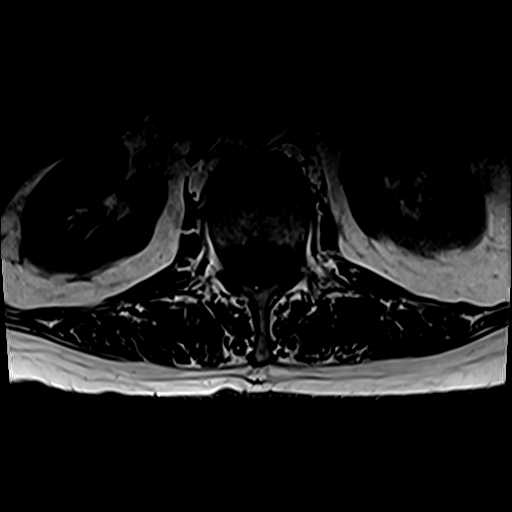
[im 43/43]
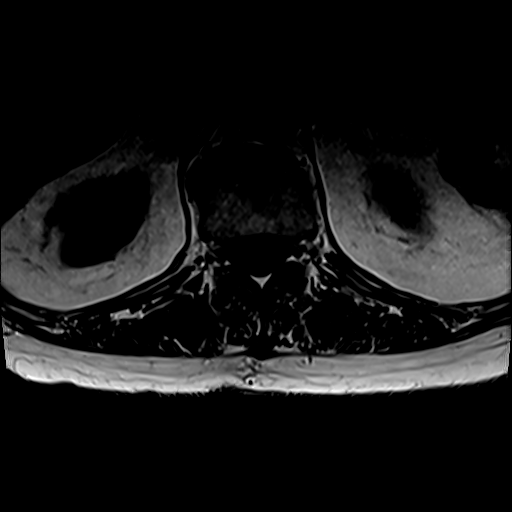

[Series 9: T2 post-contrast · sagittal · 4.0mm · 0.81mm/px · 4 of 17 slices shown]
[im 1/17]
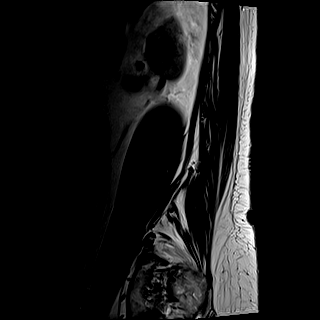
[im 6/17]
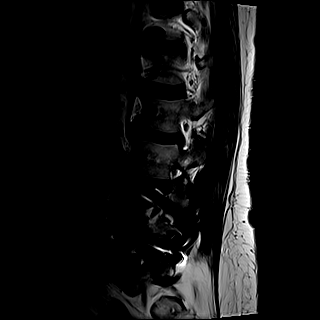
[im 11/17]
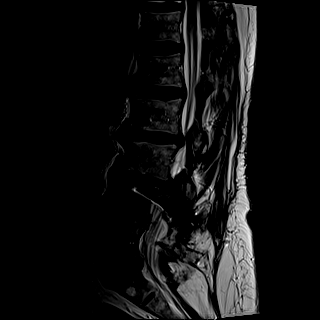
[im 17/17]
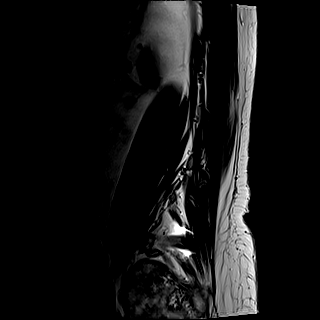

[Series 10: T1 fat-sat post-contrast · sagittal · 4.0mm · 0.81mm/px · 4 of 17 slices shown]
[im 1/17]
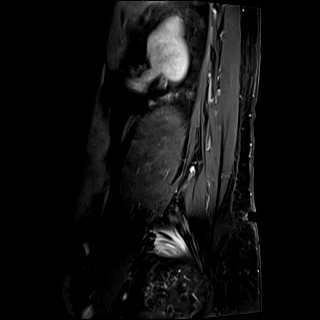
[im 6/17]
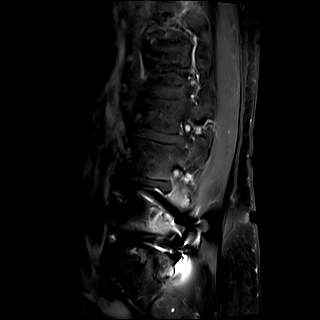
[im 11/17]
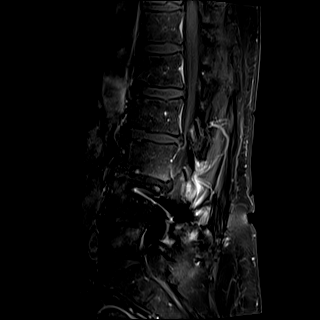
[im 17/17]
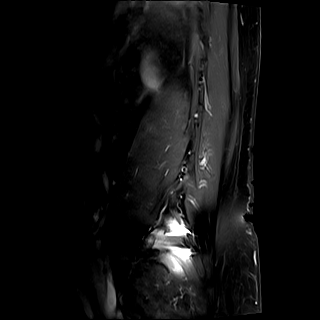

[31 of 48 positions shown; findings below may reference images not displayed]

FINDINGS: Segmentation:  Standard.

Alignment:  Grade 1 retrolisthesis at L3-4

Vertebrae:  L4-5 PLIF

Conus medullaris and cauda equina: Conus extends to the L1 level.
Conus and cauda equina appear normal.

Paraspinal and other soft tissues: Negative

Disc levels:

L1-L2: Small right subarticular disc protrusion. No spinal canal
stenosis. No neural foraminal stenosis.

L2-L3: Large right asymmetric disc bulge with right subarticular
annular fissure, unchanged. Mild spinal canal stenosis. No neural
foraminal stenosis.

L3-L4: Intermediate sized disc bulge, unchanged. Mild spinal canal
stenosis. No neural foraminal stenosis.

L4-L5: PLIF. No spinal canal stenosis. No neural foraminal
stenosis.

L5-S1: Severe facet arthrosis, unchanged. No spinal canal stenosis.
Mild right neural foraminal stenosis.

Visualized sacrum: Normal.
IMPRESSION: 1. Unchanged examination of the lumbar spine with mild spinal canal
stenosis at L2-L3 and L3-L4.
2. Unchanged severe facet arthrosis at L5-S1 with mild right neural
foraminal stenosis.

## 2021-05-20 MED ORDER — GADOBUTROL 1 MMOL/ML IV SOLN
9.0000 mL | Freq: Once | INTRAVENOUS | Status: AC | PRN
  Administered 2021-05-20: 9 mL via INTRAVENOUS

## 2021-05-20 MED ORDER — HYDROCODONE-ACETAMINOPHEN 5-325 MG PO TABS: 1.0000 | ORAL_TABLET | Freq: Four times a day (QID) | ORAL | 0 refills | Status: DC | PRN

## 2021-05-20 MED ORDER — OXYCODONE-ACETAMINOPHEN 5-325 MG PO TABS
2.0000 | ORAL_TABLET | Freq: Once | ORAL | Status: AC
  Administered 2021-05-20: 2 via ORAL
  Filled 2021-05-20: qty 2

## 2021-05-20 MED ORDER — DICLOFENAC EPOLAMINE 1.3 % EX PTCH: 1.0000 | MEDICATED_PATCH | Freq: Two times a day (BID) | CUTANEOUS | 1 refills | Status: DC

## 2021-05-20 MED ORDER — PREDNISONE 20 MG PO TABS: 40.0000 mg | ORAL_TABLET | Freq: Every day | ORAL | 0 refills | Status: DC

## 2021-05-20 MED ORDER — FOSFOMYCIN TROMETHAMINE 3 G PO PACK
3.0000 g | PACK | Freq: Once | ORAL | Status: AC
  Administered 2021-05-20: 3 g via ORAL
  Filled 2021-05-20: qty 3

## 2021-05-20 MED ORDER — PREDNISONE 20 MG PO TABS
60.0000 mg | ORAL_TABLET | ORAL | Status: AC
  Administered 2021-05-20: 60 mg via ORAL
  Filled 2021-05-20: qty 3

## 2021-05-20 MED ORDER — ONDANSETRON 4 MG PO TBDP
4.0000 mg | ORAL_TABLET | Freq: Once | ORAL | Status: AC
  Administered 2021-05-20: 4 mg via ORAL
  Filled 2021-05-20: qty 1

## 2021-05-20 NOTE — ED Provider Notes (Signed)
Emergency Medicine Provider Triage Evaluation Note  Cynthia Padilla , a 70 y.o. female  was evaluated in triage.  Pt complains of R flank pain started Monday seen at [facility] on Tuesday. Spinal injetion in July. EDP recommended MRI at last visit.   Review of Systems  Positive: Back Pain Negative: Fever  Physical Exam  BP (!) 144/74   Pulse (!) 59   Temp 99.5 F (37.5 C) (Oral)   Resp 18   SpO2 100%  Gen:   Awake, no distress   Resp:  Normal effort  MSK:   Moves extremities without difficulty  Other:  Lumbar 0pain  no red flags  Medical Decision Making  Medically screening exam initiated at 12:15 PM.  Appropriate orders placed.  Cynthia Padilla was informed that the remainder of the evaluation will be completed by another provider, this initial triage assessment does not replace that evaluation, and the importance of remaining in the ED until their evaluation is complete.  Patient with lumbar pain MRI recommended if worsening sxs. I have orderd labs, -pain meds and imaging.   Cynthia Mail, PA-C 05/20/21 1221    Cynthia Dessert, MD 05/20/21 [phone]

## 2021-05-20 NOTE — Discharge Instructions (Signed)
As discussed, today's evaluation has been generally reassuring.  Your MRI demonstrate lesions of the discs in your lower back, however.  It is important that you follow-up with your neurosurgeon.  Please take your medication as directed.  If your diclofenac prescription is not covered by your pharmacy coverage, please obtain and use Salonpas with methyl salicylate as an alternative.

## 2021-05-20 NOTE — ED Triage Notes (Signed)
Pt arrived via POV, c/o right sided flank pain that started mon. Denies any urinary issues. Was seen at [facility] for same 10/11. Pain worsening since.

## 2021-05-20 NOTE — ED Provider Notes (Signed)
[facility] DEPT Provider Note   CSN: [id number] Arrival date & time: 05/20/21  1123     History Chief Complaint  Patient presents with   Flank Pain    Cynthia Padilla is a 70 y.o. female.  HPI Patient presents with concern of pain in the right flank, right hemipelvis.  Onset was about 4 days ago, she was seen and evaluated at our affiliated emergency department 2 days ago.  She notes that she has had improvement there with fentanyl, morphine.  However, since that time she has had persistent pain, with no relief with OTC medication.  Pain is marginally improved with upright positioning.  Pain is severe, sharp, radiating from the right lower back and flank circumferentially in the right hemipelvis.  No dysuria, polyuria, no fall, fever, chest pain, dyspnea. She has history of multiple lumbar spine procedure. She did have similar pain earlier in the year, had some relief with a spinal injection.  During her last visit she was encouraged to return for consideration of MRI if her pain became worse.    Past Medical History:  Diagnosis Date   Anxiety    Arthritis    thinks she has some in her spine, bursitis in hips   Asthma    had it once when she had episode of bronchitis about 10 yrs ago   Diverticulitis    DIVERTICULOSIS, COLON 11/04/2004   Qualifier: Diagnosis of  By: Hardin Negus CMA (AAMA), [place]     GERD (gastroesophageal reflux disease)    Hepatitis    thinks she had Hepatits as a child   History of colonic polyps    Hyperlipidemia    Hypertension    Internal hemorrhoids    PONV (postoperative nausea and vomiting)    states she gets deathly sick   Vertigo     Patient Active Problem List   Diagnosis Date Noted   Dizziness 03/06/2014   Chest discomfort 07/15/2013   Pulmonary nodule 04/02/2012   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     Family History  Problem Relation Age of Onset   Ovarian cancer Mother    Lung cancer Mother    Diabetes Father    Heart disease Father    Leukemia Father    Diabetes Sister        x 2   Rectal cancer Sister        anal   Diabetes Brother        x 3   Colon cancer Neg Hx    Throat cancer Neg Hx    Kidney disease Neg Hx    Anesthesia problems Neg Hx    Liver disease Neg Hx    Breast cancer Neg Hx     Social History   Tobacco Use   Smoking status: Never   Smokeless tobacco: Never  Vaping Use   Vaping Use: Never used  Substance Use Topics   Alcohol use:  Yes    Alcohol/week: 3.0 standard drinks    Types: 3 Glasses of wine per week   Drug use: No    Home Medications Prior to Admission medications   Medication Sig Start Date End Date Taking? Authorizing Provider  acetaminophen (TYLENOL) 500 MG tablet Take 1,000 mg by mouth daily as needed for headache or moderate pain.   Yes [provider]  Cholecalciferol 25 MCG (1000 UT) capsule Take 1,000 Units by mouth every evening.   Yes [provider]  Coenzyme Q10 (COQ10) 150 MG CAPS Take 1 capsule by mouth 2 (two) times a week.   Yes [provider]  diclofenac (FLECTOR) 1.3 % PTCH Place 1 patch onto the skin 2 (two) times daily. 05/20/21  Yes Carmin Muskrat, MD  hydrochlorothiazide (HYDRODIURIL) 25 MG tablet Take 25 mg by mouth every evening. 07/26/14  Yes [provider]  HYDROcodone-acetaminophen (NORCO/VICODIN) 5-325 MG tablet Take 1 tablet by mouth every 6 (six) hours as needed for severe pain. 05/20/21  Yes Carmin Muskrat, MD   Polyethyl Glycol-Propyl Glycol (SYSTANE OP) Place 1 drop into both eyes at bedtime.   Yes [provider]  predniSONE (DELTASONE) 20 MG tablet Take 2 tablets (40 mg total) by mouth daily with breakfast. For the next four days 05/20/21  Yes Carmin Muskrat, MD  meclizine (ANTIVERT) 25 MG tablet Take 1 tablet (25 mg total) by mouth 3 (three) times daily as needed. Patient not taking: No sig reported 03/06/14   Marin Olp, MD    Allergies    Atorvastatin, Rosuvastatin, and Rosuvastatin calcium  Review of Systems   Review of Systems  Constitutional:        Per HPI, otherwise negative  HENT:         Per HPI, otherwise negative  Respiratory:         Per HPI, otherwise negative  Cardiovascular:        Per HPI, otherwise negative  Gastrointestinal:  Negative for vomiting.  Endocrine:       Negative aside from HPI  Genitourinary:        Neg aside from HPI   Musculoskeletal:        Per HPI, otherwise negative  Skin: Negative.   Neurological:  Negative for syncope.   Physical Exam Updated Vital Signs BP 126/61   Pulse (!) 55   Temp (!) 97.3 F (36.3 C)   Resp 15   Ht 5\' 8"  (1.727 m)   Wt 83.9 kg   SpO2 96%   BMI 28.13 kg/m   Physical Exam Vitals and nursing note reviewed.  Constitutional:      General: She is not in acute distress.    Appearance: She is well-developed.  HENT:     Head: Normocephalic and atraumatic.  Eyes:     Conjunctiva/sclera: Conjunctivae normal.  Cardiovascular:     Rate and Rhythm: Normal rate and regular rhythm.  Pulmonary:     Effort: Pulmonary effort is normal. No respiratory distress.     Breath sounds: Normal breath sounds. No stridor.  Abdominal:     General: There is no distension.  Musculoskeletal:       Arms:  Skin:    General: Skin is warm and dry.  Neurological:     Mental Status: She is alert and oriented to person, place, and time.     Cranial Nerves: No cranial nerve deficit.     Motor: No weakness, tremor,  atrophy or abnormal muscle tone.  ED Results / Procedures / Treatments   Labs (all labs ordered are listed, but only abnormal results are displayed) Labs Reviewed  BASIC METABOLIC PANEL - Abnormal; Notable for the following components:      Result Value   Sodium 131 (*)    Potassium 3.2 (*)    Chloride 94 (*)    Glucose, Bld 111 (*)    All other components within normal limits  URINALYSIS, ROUTINE W REFLEX MICROSCOPIC - Abnormal; Notable for the following components:   Leukocytes,Ua LARGE (*)    Bacteria, UA RARE (*)    All other components within normal limits  CBC WITH DIFFERENTIAL/PLATELET    EKG None  Radiology MR Lumbar Spine W Wo Contrast  Result Date: 05/20/2021 CLINICAL DATA:  Low back pain EXAM: MRI LUMBAR SPINE WITHOUT AND WITH CONTRAST TECHNIQUE: Multiplanar and multiecho pulse sequences of the lumbar spine were obtained without and with intravenous contrast. CONTRAST:  50mL GADAVIST GADOBUTROL 1 MMOL/ML IV SOLN COMPARISON:  02/07/2021 FINDINGS: Segmentation:  Standard. Alignment:  Grade 1 retrolisthesis at L3-4 Vertebrae:  L4-5 PLIF Conus medullaris and cauda equina: Conus extends to the L1 level. Conus and cauda equina appear normal. Paraspinal and other soft tissues: Negative Disc levels: L1-L2: Small right subarticular disc protrusion. No spinal canal stenosis. No neural foraminal stenosis. L2-L3: Large right asymmetric disc bulge with right subarticular annular fissure, unchanged. Mild spinal canal stenosis. No neural foraminal stenosis. L3-L4: Intermediate sized disc bulge, unchanged. Mild spinal canal stenosis. No neural foraminal stenosis. L4-L5: PLIF. No spinal canal stenosis. No neural foraminal stenosis. L5-S1: Severe facet arthrosis, unchanged. No spinal canal stenosis. Mild right neural foraminal stenosis. Visualized sacrum: Normal. IMPRESSION: 1. Unchanged examination of the lumbar spine with mild spinal canal stenosis at L2-L3 and L3-L4. 2. 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 Electronically Signed   By: Ulyses Jarred M.D.   On: 05/20/2021 21:17    Procedures Procedures   Medications Ordered in ED Medications  fosfomycin (MONUROL) packet 3 g (has no administration in time range)  predniSONE (DELTASONE) tablet 60 mg (has no administration in time range)  oxyCODONE-acetaminophen (PERCOCET/ROXICET) 5-325 MG per tablet 2 tablet (2 tablets Oral Given 05/20/21 1635)  ondansetron (ZOFRAN-ODT) disintegrating tablet 4 mg (4 mg Oral Given 05/20/21 1635)  gadobutrol (GADAVIST) 1 MMOL/ML injection 9 mL (9 mLs Intravenous Contrast Given 05/20/21 2011)    ED Course  I have reviewed the triage vital signs and the nursing notes.  Pertinent labs & imaging results that were available during my care of the patient were reviewed by me and considered in my medical decision making (see chart for details).  Update:, Nurse evaluation, no rash. 10:33 PM Patient in no distress, resting, not accompanied by her husband. We discussed all findings including MRI results, urinalysis and I have reviewed these with her, and in comparison to CT imaging from a few days ago, no notable new abnormalities beyond disc protrusion in the lumbar spine, right greater than left, consistent with the patient's pain.  Patient is found to have increasingly large leukocyte count in her urinalysis, though she has no obvious urinary complaints, given these change over 2-day time she will receive a dose of fosfomycin here.  No evidence for bacteremia, sepsis, no evidence for distal neurovascular compromise.  Patient has a neurosurgeon with whom she can follow-up she was discharged in stable condition. MDM Rules/Calculators/A&P MDM Number of Diagnoses or Management Options Acute right-sided low back pain without sciatica: established,  worsening Lower urinary tract infectious disease: new, needed workup   Amount and/or Complexity of Data  Reviewed Clinical lab tests: ordered and reviewed Tests in the radiology section of CPT: ordered and reviewed Tests in the medicine section of CPT: reviewed and ordered Decide to obtain previous medical records or to obtain history from someone other than the patient: yes Obtain history from someone other than the patient: yes Review and summarize past medical records: yes Independent visualization of images, tracings, or specimens: yes  Risk of Complications, Morbidity, and/or Mortality Presenting problems: high Diagnostic procedures: high Management options: high  Critical Care Total time providing critical care: < 30 minutes  Patient Progress Patient progress: improved   Final Clinical Impression(s) / ED Diagnoses Final diagnoses:  Acute right-sided low back pain without sciatica  Lower urinary tract infectious disease    Rx / DC Orders ED Discharge Orders          Ordered    predniSONE (DELTASONE) 20 MG tablet  Daily with breakfast        05/20/21 2227    diclofenac (FLECTOR) 1.3 % PTCH  2 times daily        05/20/21 2227    HYDROcodone-acetaminophen (NORCO/VICODIN) 5-325 MG tablet  Every 6 hours PRN        05/20/21 2227             Carmin Muskrat, MD 05/20/21 2235

## 2021-05-25 ENCOUNTER — Telehealth: Payer: Self-pay | Admitting: Internal Medicine

## 2021-05-25 NOTE — Telephone Encounter (Signed)
Spoke with pt who is requesting prophylactic Paxlovid for upcoming trip to [place] Pt states she has already had a conservation with Dr. Chase Caller regarding this. Dr. Chase Caller please advise.

## 2021-05-26 MED ORDER — NIRMATRELVIR/RITONAVIR (PAXLOVID)TABLET: 3.0000 | ORAL_TABLET | Freq: Two times a day (BID) | ORAL | 0 refills | Status: AC

## 2021-05-26 NOTE — Telephone Encounter (Signed)
Paxlovid Rx has been sent to preferred pharmacy for pt. Called and spoke with pt letting her know this had been done and she verbalized understanding. Nothing further needed.

## 2021-05-26 NOTE — Telephone Encounter (Signed)
PAXLOVID   Paxlovid (nirmatelvir 300/Ritonavir100) - BID x 5 days - for GFR >= 60 (creat normal 05/20/21)  PLEASE CHECK MED LIST for the following issues. Please check the 2 different condition related to concomitant medications in alphabetical order  If Cynthia Padilla with DOB 04-02-1951 is on any of the following Strong CYP3A inhibtors - this patient Cynthia Padilla should withold these concomitant meds so they can start paxlovid stratight away. If taking any of these:  alfuzosin, amiodarone, clozapine, colchicine, dihydroergotamine, dronedarone, ergotamine, flecainide, lovastatin, lurasidone, methylergonovine, midazolam [oral], pethidine, pimozide, propafenone, propoxyphene, quinidine, ranolazine, sildenafil simvastatin, triazolam).   If   Cynthia Padilla  with dob 1951-02-27 Is on any of these other strong CYP3A inducers then starting paxlovid should be delayed and the following meds should wash out first. These are dapalutamide, carbamazepine, phenobarbital, phenytoin, rifampin, St John's wort) - let me know immediately and we should delay starting paxlovid by some days even if he stops these medication.    Cynthia Padilla  OF FOLLOWING SIDE EFFECTS  Side effects - all < 5%  - skin rash (and veyr rare a conditon called TEN) - angiomedia  - myalgia - jaundice - high bP (1%) - loss of taste  - diarrhea - rebound 25%

## 2021-06-25 DIAGNOSIS — D1801 Hemangioma of skin and subcutaneous tissue: Secondary | ICD-10-CM | POA: Diagnosis not present

## 2021-06-25 DIAGNOSIS — L821 Other seborrheic keratosis: Secondary | ICD-10-CM | POA: Diagnosis not present

## 2021-06-25 DIAGNOSIS — Z85828 Personal history of other malignant neoplasm of skin: Secondary | ICD-10-CM | POA: Diagnosis not present

## 2021-06-25 DIAGNOSIS — L814 Other melanin hyperpigmentation: Secondary | ICD-10-CM | POA: Diagnosis not present

## 2021-07-08 DIAGNOSIS — J0100 Acute maxillary sinusitis, unspecified: Secondary | ICD-10-CM | POA: Diagnosis not present

## 2021-07-08 DIAGNOSIS — R599 Enlarged lymph nodes, unspecified: Secondary | ICD-10-CM | POA: Diagnosis not present

## 2021-07-08 DIAGNOSIS — Z1152 Encounter for screening for COVID-19: Secondary | ICD-10-CM | POA: Diagnosis not present

## 2021-07-08 DIAGNOSIS — R058 Other specified cough: Secondary | ICD-10-CM | POA: Diagnosis not present

## 2021-07-22 DIAGNOSIS — H93291 Other abnormal auditory perceptions, right ear: Secondary | ICD-10-CM | POA: Insufficient documentation

## 2021-07-22 DIAGNOSIS — R590 Localized enlarged lymph nodes: Secondary | ICD-10-CM | POA: Diagnosis not present

## 2021-08-13 DIAGNOSIS — H903 Sensorineural hearing loss, bilateral: Secondary | ICD-10-CM | POA: Diagnosis not present

## 2021-09-03 DIAGNOSIS — Z01419 Encounter for gynecological examination (general) (routine) without abnormal findings: Secondary | ICD-10-CM | POA: Diagnosis not present

## 2021-09-10 DIAGNOSIS — E785 Hyperlipidemia, unspecified: Secondary | ICD-10-CM | POA: Diagnosis not present

## 2021-09-10 DIAGNOSIS — I10 Essential (primary) hypertension: Secondary | ICD-10-CM | POA: Diagnosis not present

## 2021-09-24 DIAGNOSIS — M25552 Pain in left hip: Secondary | ICD-10-CM | POA: Diagnosis not present

## 2021-09-24 DIAGNOSIS — M1612 Unilateral primary osteoarthritis, left hip: Secondary | ICD-10-CM | POA: Diagnosis not present

## 2021-09-27 DIAGNOSIS — Z1331 Encounter for screening for depression: Secondary | ICD-10-CM | POA: Diagnosis not present

## 2021-09-27 DIAGNOSIS — I10 Essential (primary) hypertension: Secondary | ICD-10-CM | POA: Diagnosis not present

## 2021-09-27 DIAGNOSIS — Z1339 Encounter for screening examination for other mental health and behavioral disorders: Secondary | ICD-10-CM | POA: Diagnosis not present

## 2021-09-27 DIAGNOSIS — M791 Myalgia, unspecified site: Secondary | ICD-10-CM | POA: Diagnosis not present

## 2021-09-27 DIAGNOSIS — K219 Gastro-esophageal reflux disease without esophagitis: Secondary | ICD-10-CM | POA: Diagnosis not present

## 2021-09-27 DIAGNOSIS — Z0000 Encounter for general adult medical examination without abnormal findings: Secondary | ICD-10-CM | POA: Diagnosis not present

## 2021-09-27 DIAGNOSIS — I2510 Atherosclerotic heart disease of native coronary artery without angina pectoris: Secondary | ICD-10-CM | POA: Diagnosis not present

## 2021-09-27 DIAGNOSIS — I712 Thoracic aortic aneurysm, without rupture, unspecified: Secondary | ICD-10-CM | POA: Diagnosis not present

## 2021-09-28 ENCOUNTER — Telehealth: Payer: Self-pay | Admitting: *Deleted

## 2021-09-28 NOTE — Telephone Encounter (Signed)
° °  Pre-operative Risk Assessment    Patient Name: Cynthia Padilla  DOB: Jan 26, 1951 MRN: [id number]      Request for Surgical Clearance    Procedure:   LEFT TOTAL HIP ARTHROPLASTY   Date of Surgery:  Clearance 12/21/21                                 Surgeon:  Gaynelle Arabian, MD Surgeon's Group or Practice Name:  [facility] Phone number:  [phone] Fax number:  [phone] / ATTN:  [place] Chard   Type of Clearance Requested:   - Medical    Type of Anesthesia:   CHOICE   Additional requests/questions:    Astrid Divine   09/28/2021, 2:50 PM

## 2021-09-28 NOTE — Telephone Encounter (Signed)
° °  Name: Cynthia Padilla  DOB: 05/13/1951  MRN: [id number]  Primary Cardiologist: Dorris Carnes, MD  Chart reviewed as part of pre-operative protocol coverage. Because of Saralee Bolick Duran's past medical history and time since last visit, she will require a follow-up visit in order to better assess preoperative cardiovascular risk.   As below, Beulah Capobianco was last seen on 06/2020 - given that she has not been seen in the last year, needs OV for clearing.  Georjean Mode already called patient to notify her, and we are awaiting patient callback.  - I will send this message to requesting surgeon's office via fax to inform them of need for appointment prior to surgery.  There are currently no antiplatelets listed on MAR to hold.   Charlie Pitter, PA-C  09/28/2021, 3:50 PM

## 2021-09-28 NOTE — Telephone Encounter (Signed)
Received a clearance request on pt, noticed she hasn't been seen since 06/2020.  Called pt to let her know that she will have to be seen in order for clearance to be addressed.  Pt stated she will call back when she has her calendar.

## 2021-10-01 NOTE — Telephone Encounter (Signed)
Pt has been scheduled to see Richardson Dopp, PA-C, 426/23, clearance will be addressed at that time.  Will route back to the requesting surgeon's office to make them aware.

## 2021-10-04 ENCOUNTER — Other Ambulatory Visit: Payer: Self-pay | Admitting: Internal Medicine

## 2021-10-15 ENCOUNTER — Encounter: Payer: Self-pay | Admitting: Internal Medicine

## 2021-11-01 ENCOUNTER — Ambulatory Visit: Payer: Medicare HMO | Admitting: Podiatry

## 2021-11-02 ENCOUNTER — Ambulatory Visit: Payer: Medicare HMO | Admitting: Podiatry

## 2021-11-24 ENCOUNTER — Encounter: Payer: Self-pay | Admitting: Podiatry

## 2021-11-24 ENCOUNTER — Ambulatory Visit: Payer: Medicare HMO | Admitting: Podiatry

## 2021-11-24 ENCOUNTER — Ambulatory Visit (INDEPENDENT_AMBULATORY_CARE_PROVIDER_SITE_OTHER): Payer: Medicare HMO

## 2021-11-24 DIAGNOSIS — R200 Anesthesia of skin: Secondary | ICD-10-CM | POA: Diagnosis not present

## 2021-11-24 DIAGNOSIS — M21611 Bunion of right foot: Secondary | ICD-10-CM | POA: Diagnosis not present

## 2021-11-24 DIAGNOSIS — M5416 Radiculopathy, lumbar region: Secondary | ICD-10-CM | POA: Insufficient documentation

## 2021-11-24 DIAGNOSIS — M21612 Bunion of left foot: Secondary | ICD-10-CM

## 2021-11-24 DIAGNOSIS — Z209 Contact with and (suspected) exposure to unspecified communicable disease: Secondary | ICD-10-CM | POA: Insufficient documentation

## 2021-11-24 DIAGNOSIS — J029 Acute pharyngitis, unspecified: Secondary | ICD-10-CM | POA: Insufficient documentation

## 2021-11-24 DIAGNOSIS — Z20822 Contact with and (suspected) exposure to covid-19: Secondary | ICD-10-CM | POA: Insufficient documentation

## 2021-11-24 NOTE — Patient Instructions (Signed)

## 2021-11-27 NOTE — Progress Notes (Signed)
Subjective:  ? ?Patient ID: Cynthia Padilla, female   DOB: 71 y.o.   MRN: [id number]  ? ?HPI ?71 year old female presents the office today for concerns of bilateral bunions with the right side worse than the left.  She does occasionally get numbness to the second and third toes in the right foot as well.  She denies any recent injury.  She is getting worse for the last 1 year.  No recent treatment. ? ?She has upcoming hip surgery in May. ? ? ?Review of Systems  ?All other systems reviewed and are negative. ? ?Past Medical History:  ?Diagnosis Date  ? Anxiety   ? Arthritis   ? thinks she has some in her spine, bursitis in hips  ? Asthma   ? had it once when she had episode of bronchitis about 10 yrs ago  ? Diverticulitis   ? DIVERTICULOSIS, COLON 11/04/2004  ? Qualifier: Diagnosis of  By: Hardin Negus CMA Deborra Medina), [place]    ? GERD (gastroesophageal reflux disease)   ? Hepatitis   ? thinks she had Hepatits as a child  ? History of colonic polyps   ? Hyperlipidemia   ? Hypertension   ? Internal hemorrhoids   ? PONV (postoperative nausea and vomiting)   ? states she gets deathly sick  ? Vertigo   ? ? ?Past Surgical History:  ?Procedure Laterality Date  ? BREAST BIOPSY Right   ? ELBOW FRACTURE SURGERY Left   ? HERNIA REPAIR    ? pt denies  ? LAMINECTOMY  10/11/11  ? with fusion @ L4-5  ? RETINAL TEAR REPAIR CRYOTHERAPY Right 2015  ? TUBAL LIGATION    ? ? ? ?Current Outpatient Medications:  ?  loratadine (CLARITIN) 10 MG tablet, Take 1 tablet by mouth daily., Disp: , Rfl:  ?  acetaminophen (TYLENOL) 500 MG tablet, Take 1,000 mg by mouth daily as needed for headache or moderate pain., Disp: , Rfl:  ?  budesonide-formoterol (SYMBICORT) 80-4.5 MCG/ACT inhaler, INHALE 1 PUFF BY MOUTH TWICE A DAY, Disp: , Rfl:  ?  Calcium Carbonate-Vit D-Min (RA CALCIUM 600/VIT D/MINERALS) 600-200 MG-UNIT TABS, SMARTSIG:1 Tablet(s) By Mouth, Disp: , Rfl:  ?  CELEBREX 200 MG capsule, Take by mouth., Disp: , Rfl:  ?  Cholecalciferol 25 MCG (1000  UT) capsule, Take 1,000 Units by mouth every evening., Disp: , Rfl:  ?  Coenzyme Q10 (COQ10) 150 MG CAPS, Take 1 capsule by mouth 2 (two) times a week., Disp: , Rfl:  ?  diazepam (VALIUM) 10 MG tablet, diazepam 10 mg tablet  TAKE 1 TABLET HALF AN HOUR BEFORE MRI, Disp: , Rfl:  ?  diclofenac (FLECTOR) 1.3 % PTCH, Place 1 patch onto the skin 2 (two) times daily., Disp: 5 patch, Rfl: 1 ?  diclofenac Sodium (VOLTAREN) 1 % GEL, diclofenac 1 % topical gel  APPLY 2 GRAMS TO AFFECTED AREA(S) TOPICALLY TWICE TO THREE TIMES A DAY, Disp: , Rfl:  ?  fluticasone (CUTIVATE) 0.05 % cream, Apply topically 2 (two) times daily., Disp: , Rfl:  ?  hydrochlorothiazide (HYDRODIURIL) 25 MG tablet, Take 25 mg by mouth every evening., Disp: , Rfl:  ?  HYDROcodone-acetaminophen (NORCO/VICODIN) 5-325 MG tablet, Take 1 tablet by mouth every 6 (six) hours as needed for severe pain., Disp: 10 tablet, Rfl: 0 ?  meclizine (ANTIVERT) 25 MG tablet, Take 1 tablet (25 mg total) by mouth 3 (three) times daily as needed. (Patient not taking: No sig reported), Disp: 30 tablet, Rfl: 0 ?  mupirocin  ointment (BACTROBAN) 2 %, Apply 1 application. topically daily., Disp: , Rfl:  ?  NEXIUM 40 MG capsule, Take by mouth., Disp: , Rfl:  ?  nystatin (MYCOSTATIN) 100000 UNIT/ML suspension, nystatin 100,000 unit/mL oral suspension, Disp: , Rfl:  ?  Polyethyl Glycol-Propyl Glycol (SYSTANE OP), Place 1 drop into both eyes at bedtime., Disp: , Rfl:  ?  PRALUENT 75 MG/ML SOAJ, SMARTSIG:75 Milligram(s) SUB-Q Every 2 Weeks, Disp: , Rfl:  ?  pravastatin (PRAVACHOL) 20 MG tablet, 1 tablet, Disp: , Rfl:  ?  predniSONE (DELTASONE) 20 MG tablet, Take 2 tablets (40 mg total) by mouth daily with breakfast. For the next four days, Disp: 8 tablet, Rfl: 0 ?  Probiotic Product ([employer] PROBIOTIC) CAPS, SMARTSIG:1 Capsule(s) By Mouth, Disp: , Rfl:  ?  traMADol (ULTRAM) 50 MG tablet, , Disp: , Rfl:  ? ?Current Facility-Administered Medications:  ?  0.9 %  sodium chloride infusion, 500 mL,  Intravenous, Continuous, Irene Shipper, MD ? ?Allergies  ?Allergen Reactions  ? Atorvastatin Other (See Comments)  ?  myalgia  ? Fluconazole Other (See Comments)  ? Rosuvastatin   ?  Myalgias on 2.'5mg'$  and '5mg'$  dose  ? Rosuvastatin Calcium   ? ? ? ? ? ?   ?Objective:  ?Physical Exam  ?General: AAO x3, NAD ? ?Dermatological: Skin is warm, dry and supple bilateral.  There are no open sores, no preulcerative lesions, no rash or signs of infection present. ? ?Vascular: Dorsalis Pedis artery and Posterior Tibial artery pedal pulses are 2/4 bilateral with immedate capillary fill time.  There is no pain with calf compression, swelling, warmth, erythema.  ? ?Neruologic: Grossly intact via light touch bilateral.  ? ?Musculoskeletal: Moderate to severe bunions are present bilaterally with right side worse than left.  Tenderness directly on the bunion site.  No pain with MPJ range of motion.  No significant hypermobility.  Subjectively she is getting numbness to the second and third toe area on the right foot, unable to palpate any neuroma.  No erythema or tenderness.  No edema.  Muscular strength 5/5 in all groups tested bilateral. ? ?Gait: Unassisted, Nonantalgic.  ? ? ?   ?Assessment:  ? ?Symptomatic bunion; neuritis ? ?   ?Plan:  ?-Treatment options discussed including all alternatives, risks, and complications ?-Etiology of symptoms were discussed ?-X-rays were obtained and reviewed with the patient.  3 views of bilateral feet were obtained.  Intermetatarsal angle on the right side approximately 16 degrees.  Narrowing of the first MPJ. ?-I think the numbness is result of compensation.  Could be a small neuroma which may go to palpate today. ?-We discussed both conservative as well as surgical treatment options.  Conservative discussion modifications, offloading as well as anti-inflammatories as needed.  Discussed good arch supports.  Surgical we discussed bunionectomy however she has upcoming hip surgery.  She is going to  to continue with conservative care for now. ? ?Trula Slade DPM ? ?   ? ?

## 2021-11-30 NOTE — Progress Notes (Signed)
? ? ?Office Visit  ?  ?Patient Name: Cynthia Padilla ?Date of Encounter: 12/01/2021 ? ?Primary Care Provider:  Velna Hatchet, MD ?Primary Cardiologist:  Dorris Carnes, MD ?Primary Electrophysiologist: None ?Chief Complaint  ?  ?Preoperative clearance for total left hip arthroplasty ? ? Patient Profile: ?GERD ?HLD ?HTN ?Anxiety ?Arthritis ?Asthma ?Thoracic aortic aneurysm ? ? Recent Studies: ?Cardiac CTA 08/2018: Calcium score 51.9, aneurysmal thoracic aorta measuring 4.1 cm, recommended annual follow-up imaging by CT or MRA. ?Cardiac CTA 08/2019: No evidence of thoracic aortic aneurysm, approximately 38 mm, 5 mm pulmonary nodule in right upper lobe, with no follow-up needed due to low risk.  68-monthnoncontrast chest CT considered if patient is high risk. ? ?History of Present Illness  ?  ?Cynthia Padilla a 71y.o. female with PMH of HLD, HTN, GERD, anxiety, arthritis, and asthma.  She was initially seen by Dr. RHarrington Challengeron 08/2015 for complaint of atypical chest pain.  She was encouraged to follow-up with GI and pain was felt to be noncardiac in origin.  Patient had calcium scoring completed 08/2018 that revealed score of 51.9, aneurysmal thoracic aorta measuring 41 mm.  She was started on Crestor 10 mg but experienced myalgias, achiness and was discontinued 2 months later.  She was referred to lipid clinic for recommendation.  Cardiac CTA completed 08/2019 surveillance of aneurysm and findings suggest no evidence of aortic dissection with minimal atherosclerotic changes. ? ?Ms. ATruxillopresents today for preoperative clearance for left total  hip arthroplasty.  She reports that she is doing doing well the exception of her arthritic left hip and sore shoulders from myalgias related to pravastatin.  She is still active and does Pilates weekly.  She is interested in revisiting possibility of trying Repatha and states that she will contact her PCP to send over most recent lipid numbers.  Patient denies chest pain,  palpitations, dyspnea, PND, orthopnea, nausea, vomiting, dizziness, syncope, edema, weight gain, or early satiety.  Her blood pressure today is 108/72 and she is tolerating the hydrochlorothiazide without any side effects.  We discussed her most recent cardiac CTA for surveillance of thoracic aneurysm and patient states that she is interested in having another scan later in the year. ? ?Past Medical History  ?  ?Past Medical History:  ?Diagnosis Date  ? Anxiety   ? Arthritis   ? thinks she has some in her spine, bursitis in hips  ? Asthma   ? had it once when she had episode of bronchitis about 10 yrs ago  ? Diverticulitis   ? DIVERTICULOSIS, COLON 11/04/2004  ? Qualifier: Diagnosis of  By: PHardin NegusCMA (Deborra Medina, [place]   ? GERD (gastroesophageal reflux disease)   ? Hepatitis   ? thinks she had Hepatits as a child  ? History of colonic polyps   ? Hyperlipidemia   ? Hypertension   ? Internal hemorrhoids   ? PONV (postoperative nausea and vomiting)   ? states she gets deathly sick  ? Vertigo   ? ?Past Surgical History:  ?Procedure Laterality Date  ? BREAST BIOPSY Right   ? ELBOW FRACTURE SURGERY Left   ? HERNIA REPAIR    ? pt denies  ? LAMINECTOMY  10/11/11  ? with fusion @ L4-5  ? RETINAL TEAR REPAIR CRYOTHERAPY Right 2015  ? TUBAL LIGATION    ? ? ?Allergies ? ?Allergies  ?Allergen Reactions  ? Atorvastatin Other (See Comments)  ?  myalgia  ? Fluconazole Other (See Comments)  ? Rosuvastatin   ?  Myalgias on 2.'5mg'$  and '5mg'$  dose  ? Rosuvastatin Calcium   ? ? ?Home Medications  ?  ?Current Outpatient Medications  ?Medication Sig Dispense Refill  ? acetaminophen (TYLENOL) 500 MG tablet Take 1,000 mg by mouth daily as needed for headache or moderate pain.    ? budesonide-formoterol (SYMBICORT) 80-4.5 MCG/ACT inhaler 1 puff as needed (allergies).    ? Calcium Carbonate-Vit D-Min (RA CALCIUM 600/VIT D/MINERALS) 600-200 MG-UNIT TABS SMARTSIG:1 Tablet(s) By Mouth    ? Cholecalciferol 25 MCG (1000 UT) capsule Take 1,000 Units by  mouth every evening.    ? Coenzyme Q10 (COQ10) 150 MG CAPS Take 1 capsule by mouth 2 (two) times a week.    ? hydrochlorothiazide (HYDRODIURIL) 25 MG tablet Take 25 mg by mouth every evening.    ? loratadine (CLARITIN) 10 MG tablet Take 1 tablet by mouth daily.    ? meclizine (ANTIVERT) 25 MG tablet Take 1 tablet (25 mg total) by mouth 3 (three) times daily as needed. 30 tablet 0  ? meloxicam (MOBIC) 15 MG tablet meloxicam 15 mg tablet ? Take 1 tablet every day by oral route.    ? mupirocin ointment (BACTROBAN) 2 % Apply 1 application. topically daily.    ? nystatin (MYCOSTATIN) 100000 UNIT/ML suspension nystatin 100,000 unit/mL oral suspension    ? Polyethyl Glycol-Propyl Glycol (SYSTANE OP) Place 1 drop into both eyes at bedtime.    ? pravastatin (PRAVACHOL) 20 MG tablet 1 tablet    ? Probiotic Product (CVS PROBIOTIC) CAPS SMARTSIG:1 Capsule(s) By Mouth    ? ?Current Facility-Administered Medications  ?Medication Dose Route Frequency Provider Last Rate Last Admin  ? 0.9 %  sodium chloride infusion  500 mL Intravenous Continuous Irene Shipper, MD      ?  ? ?Review of Systems  ?Please see the history of present illness.    ?(+) Left hip pain ?(+) Myalgia pain and upper shoulders ?All other systems reviewed and are otherwise negative except as noted above. ? ?Physical Exam  ?  ?Wt Readings from Last 3 Encounters:  ?12/01/21 195 lb 3.2 oz (88.5 kg)  ?05/20/21 185 lb (83.9 kg)  ?05/18/21 186 lb 8.2 oz (84.6 kg)  ? ?VS: ?Vitals:  ? 12/01/21 1402  ?BP: 108/72  ?Pulse: 64  ?SpO2: 96%  ?,Body mass index is 29.68 kg/m?. ? ?Constitutional:   ?   Appearance: Healthy appearance. Not in distress.  ?Neck:  ?   Vascular: JVD normal.  ?Pulmonary:  ?   Effort: Pulmonary effort is normal.  ?   Breath sounds: No wheezing. No rales. Diminished in the bases ?Cardiovascular:  ?   Normal rate. Regular rhythm. Normal S1. Normal S2.   ?   Murmurs: There is no murmur.  ?Edema: ?   Peripheral edema absent.  ?Abdominal:  ?   Palpations:  Abdomen is soft non tender. There is no hepatomegaly.  ?Skin: ?   General: Skin is warm and dry.  ?Neurological:  ?   General: No focal deficit present.  ?   Mental Status: Alert and oriented to person, place and time.  ?   Cranial Nerves: Cranial nerves are intact.  ?EKG/LABS/Other Studies Reviewed  ?  ?ECG personally reviewed by me today -sinus rhythm with left axis deviation with rate of rate 64- no acute changes. ? ?Lab Results  ?Component Value Date  ? WBC 10.3 05/20/2021  ? HGB 14.0 05/20/2021  ? HCT 41.9 05/20/2021  ? MCV 95.0 05/20/2021  ? PLT 317 05/20/2021  ? ?  Lab Results  ?Component Value Date  ? CREATININE 0.67 05/20/2021  ? BUN 11 05/20/2021  ? NA 131 (L) 05/20/2021  ? K 3.2 (L) 05/20/2021  ? CL 94 (L) 05/20/2021  ? CO2 28 05/20/2021  ? ?Lab Results  ?Component Value Date  ? ALT 15 05/18/2021  ? AST 20 05/18/2021  ? ALKPHOS 58 05/18/2021  ? BILITOT 0.4 05/18/2021  ? ?Lab Results  ?Component Value Date  ? CHOL 173 04/20/2020  ? HDL 58 04/20/2020  ? LDLCALC 100 (H) 04/20/2020  ? LDLDIRECT 125.6 03/28/2012  ? TRIG 80 04/20/2020  ? CHOLHDL 3.0 04/20/2020  ?  ?Lab Results  ?Component Value Date  ? HGBA1C 6.0 07/19/2011  ? ? ?Assessment & Plan  ?  ?1.  Preoperative clearance for arthroplasty of hip:60746} ?Ms. Medal's perioperative risk of a major cardiac event is 0.4% according to the Revised Cardiac Risk Index (RCRI).  Therefore, she is at low risk for perioperative complications.   Her functional capacity is good at 7.59 METs according to the Duke Activity Status Index (DASI). ?Recommendations: ?According to ACC/AHA guidelines, no further cardiovascular testing needed.  The patient may proceed to surgery at acceptable risk.   ?Antiplatelet and/or Anticoagulation Recommendations: ?N/A ?N/A ?  ?2.  HTN: ?-Blood pressure today was 108/72 ?-Continue HCTZ 25 mg daily  ? ?3.  Aortic dilation: ?-Cardiac chest CTA completed 08/2019 with aortic aneurysm proximally 38 mm. ?-Discussed with patient and she will call  to schedule chest and thoracic CT to evaluate aneurysm ? ?4.  Hyperlipidemia: ?-Patient still experiencing myalgias from pravastatin and is interested in discussing Repatha in the near future.  ?-She is awa

## 2021-12-01 ENCOUNTER — Encounter: Payer: Self-pay | Admitting: Nurse Practitioner

## 2021-12-01 ENCOUNTER — Ambulatory Visit: Payer: Medicare HMO | Admitting: Nurse Practitioner

## 2021-12-01 VITALS — BP 108/72 | HR 64 | Ht 68.0 in | Wt 195.2 lb

## 2021-12-01 DIAGNOSIS — Z79899 Other long term (current) drug therapy: Secondary | ICD-10-CM | POA: Diagnosis not present

## 2021-12-01 DIAGNOSIS — I10 Essential (primary) hypertension: Secondary | ICD-10-CM | POA: Diagnosis not present

## 2021-12-01 DIAGNOSIS — Z01818 Encounter for other preprocedural examination: Secondary | ICD-10-CM | POA: Diagnosis not present

## 2021-12-01 DIAGNOSIS — E782 Mixed hyperlipidemia: Secondary | ICD-10-CM | POA: Diagnosis not present

## 2021-12-01 NOTE — Patient Instructions (Signed)
Medication Instructions:  ?Your physician recommends that you continue on your current medications as directed. Please refer to the Current Medication list given to you today. ? ?*If you need a refill on your cardiac medications before your next appointment, please call your pharmacy* ? ? ?Lab Work: ?None ordered ? ?If you have labs (blood work) drawn today and your tests are completely normal, you will receive your results only by: ?MyChart Message (if you have MyChart) OR ?A paper copy in the mail ?If you have any lab test that is abnormal or we need to change your treatment, we will call you to review the results. ? ? ?Testing/Procedures: ?None ordered ? ? ?Follow-Up: ?At [facility], you and your health needs are our priority.  As part of our continuing mission to provide you with exceptional heart care, we have created designated Provider Care Teams.  These Care Teams include your primary Cardiologist (physician) and Advanced Practice Providers (APPs -  Physician Assistants and Nurse Practitioners) who all work together to provide you with the care you need, when you need it. ? ?We recommend signing up for the patient portal called "MyChart".  Sign up information is provided on this After Visit Summary.  MyChart is used to connect with patients for Virtual Visits (Telemedicine).  Patients are able to view lab/test results, encounter notes, upcoming appointments, etc.  Non-urgent messages can be sent to your provider as well.   ?To learn more about what you can do with MyChart, go to [email or web address].   ? ?Your next appointment:   ?6 month(s) ? ?The format for your next appointment:   ?In Person ? ?Provider:   ?Dorris Carnes, MD   ? ? ?Other Instructions ? ? ?Important Information About Sugar ? ? ? ? ?  ?

## 2021-12-09 ENCOUNTER — Encounter: Payer: Self-pay | Admitting: Internal Medicine

## 2021-12-09 DIAGNOSIS — E782 Mixed hyperlipidemia: Secondary | ICD-10-CM

## 2021-12-09 DIAGNOSIS — Z0189 Encounter for other specified special examinations: Secondary | ICD-10-CM | POA: Diagnosis not present

## 2021-12-09 DIAGNOSIS — Z79899 Other long term (current) drug therapy: Secondary | ICD-10-CM | POA: Diagnosis not present

## 2021-12-14 NOTE — Telephone Encounter (Signed)
Labs from [facility] received from 12/01/21 and sent to medical records to be scanned in for Pharm D appt 12/15/21.  ? ?Lipids. Hepatic, CMET, Lipomed 09/2021... per Dr Harrington Challenger to consider Repatha.  ?

## 2021-12-15 ENCOUNTER — Encounter: Payer: Self-pay | Admitting: Pharmacist

## 2021-12-15 ENCOUNTER — Ambulatory Visit: Payer: Medicare HMO | Admitting: Pharmacist

## 2021-12-15 VITALS — BP 124/84 | HR 62 | Resp 17 | Ht 67.5 in | Wt 192.6 lb

## 2021-12-15 DIAGNOSIS — I2510 Atherosclerotic heart disease of native coronary artery without angina pectoris: Secondary | ICD-10-CM | POA: Diagnosis not present

## 2021-12-15 DIAGNOSIS — E782 Mixed hyperlipidemia: Secondary | ICD-10-CM | POA: Diagnosis not present

## 2021-12-15 NOTE — Patient Instructions (Addendum)
It was nice meeting you today ? ?We would like your LDL (bad cholesterol) to be less than 70 ? ?We would like you to start your Praluent injections every 2 weeks ? ?Once you start the medication we will recheck your lab work in 2-3 months ? ?Please call with any questions ? ?Karren Cobble, PharmD, BCACP, [place], CPP ?[address], [address] ?[place], [place], [address] ?Phone: [phone], Fax: [phone]  ?

## 2021-12-15 NOTE — Progress Notes (Signed)
Patient ID: Cynthia Padilla                 DOB: 1951-01-28                    MRN: [id number] ? ? ? ? ?HPI: ?Cynthia Padilla is a 71 y.o. female patient referred to lipid clinic by Dr Harrington Challenger. PMH is significant for CAD, HTN, and thoracic aortic aneurysm. Has hip surgery planned in 2 weeks. ? ?Patient presents today in good spirits. Was prescribed Praluent '75mg'$  by her PCP but has not started. Has a history of intolerance to rosuvastatin and atorvastatin.  Is currently on pravastatin '20mg'$  and reports myalgias although they are tolerable.   ? ?Scheduled for hip surgery in 2 weeks. Tries to go to pilates class but is limited by pain. ? ?Coronary artery calcium score is 51.9. This calcium score is at percentile 66 for subjects of the same age, gender and ethnicity. ? ?Ascending thoracic aorta is aneurysmal measuring up to 4.1 cm ? ?Current Medications:  ?pravastatin '20mg'$  daily ? ?Intolerances:  ?Rosuvastatin ?Atorvastatin ?Pitavastatin ? ?Risk Factors:  ?CAD ?Elevated coronary calcium score ? ?LDL goal: <70 ? ?Labs: TC 169, Trigs 101, HDL 49, LDL 100 (12/01/21 on pravastatin '20mg'$ ) ? ?Past Medical History:  ?Diagnosis Date  ? Anxiety   ? Arthritis   ? thinks she has some in her spine, bursitis in hips  ? Asthma   ? had it once when she had episode of bronchitis about 10 yrs ago  ? Diverticulitis   ? DIVERTICULOSIS, COLON 11/04/2004  ? Qualifier: Diagnosis of  By: Hardin Negus CMA Deborra Medina), [place]    ? GERD (gastroesophageal reflux disease)   ? Hepatitis   ? thinks she had Hepatits as a child  ? History of colonic polyps   ? Hyperlipidemia   ? Hypertension   ? Internal hemorrhoids   ? PONV (postoperative nausea and vomiting)   ? states she gets deathly sick  ? Vertigo   ? ? ?Current Outpatient Medications on File Prior to Visit  ?Medication Sig Dispense Refill  ? acetaminophen (TYLENOL) 500 MG tablet Take 1,000 mg by mouth daily as needed for headache or moderate pain.    ? budesonide-formoterol (SYMBICORT) 80-4.5 MCG/ACT  inhaler 1 puff as needed (allergies).    ? Calcium Carbonate-Vit D-Min (RA CALCIUM 600/VIT D/MINERALS) 600-200 MG-UNIT TABS SMARTSIG:1 Tablet(s) By Mouth    ? Cholecalciferol 25 MCG (1000 UT) capsule Take 1,000 Units by mouth every evening.    ? Coenzyme Q10 (COQ10) 150 MG CAPS Take 1 capsule by mouth 2 (two) times a week.    ? hydrochlorothiazide (HYDRODIURIL) 25 MG tablet Take 25 mg by mouth every evening.    ? loratadine (CLARITIN) 10 MG tablet Take 1 tablet by mouth daily.    ? meclizine (ANTIVERT) 25 MG tablet Take 1 tablet (25 mg total) by mouth 3 (three) times daily as needed. 30 tablet 0  ? meloxicam (MOBIC) 15 MG tablet meloxicam 15 mg tablet ? Take 1 tablet every day by oral route.    ? mupirocin ointment (BACTROBAN) 2 % Apply 1 application. topically daily.    ? nystatin (MYCOSTATIN) 100000 UNIT/ML suspension nystatin 100,000 unit/mL oral suspension    ? Polyethyl Glycol-Propyl Glycol (SYSTANE OP) Place 1 drop into both eyes at bedtime.    ? pravastatin (PRAVACHOL) 20 MG tablet 1 tablet    ? Probiotic Product (CVS PROBIOTIC) CAPS SMARTSIG:1 Capsule(s) By Mouth    ? ?  Current Facility-Administered Medications on File Prior to Visit  ?Medication Dose Route Frequency Provider Last Rate Last Admin  ? 0.9 %  sodium chloride infusion  500 mL Intravenous Continuous Irene Shipper, MD      ? ? ?Allergies  ?Allergen Reactions  ? Atorvastatin Other (See Comments)  ?  myalgia  ? Fluconazole Other (See Comments)  ? Rosuvastatin   ?  Myalgias on 2.'5mg'$  and '5mg'$  dose  ? Rosuvastatin Calcium   ? ? ?Assessment/Plan: ? ?1. Hyperlipidemia - Patient's most recent LDL 100 which is above goal of <70. Will need further LDL lowering due to coronary calcium. Patient has Praluent at home but has not taken yet.  Using demo pen, educated patient on mechanism of action, site selection, administration, and possible adverse effects. Patient was able to demonstrate use in room.  Do not need to complete prior auth. Advised patient will need  fasting lipid panel in 2-3 months. Patient voiced understanding.  Due to myalgias with pravastatin, can d/c at this time.   ? ?Start Praluent '75mg'$  sq q 14 days ?Recheck lipid panel in 2-3 months. ? ?Karren Cobble, PharmD, BCACP, [place], CPP ?[address], [address] ?[place], [place], [address] ?Phone: [phone], Fax: [phone]  ?  ?

## 2021-12-20 DIAGNOSIS — M25522 Pain in left elbow: Secondary | ICD-10-CM | POA: Diagnosis not present

## 2021-12-21 DIAGNOSIS — M1612 Unilateral primary osteoarthritis, left hip: Secondary | ICD-10-CM | POA: Diagnosis not present

## 2021-12-21 DIAGNOSIS — Z96642 Presence of left artificial hip joint: Secondary | ICD-10-CM | POA: Diagnosis not present

## 2021-12-27 ENCOUNTER — Encounter: Payer: Self-pay | Admitting: Pharmacist

## 2021-12-27 DIAGNOSIS — E782 Mixed hyperlipidemia: Secondary | ICD-10-CM

## 2021-12-27 DIAGNOSIS — I2510 Atherosclerotic heart disease of native coronary artery without angina pectoris: Secondary | ICD-10-CM

## 2021-12-28 MED ORDER — PRAVASTATIN SODIUM 20 MG PO TABS: 20.0000 mg | ORAL_TABLET | Freq: Every day | ORAL | 1 refills | Status: DC

## 2022-01-10 DIAGNOSIS — M7712 Lateral epicondylitis, left elbow: Secondary | ICD-10-CM | POA: Diagnosis not present

## 2022-01-14 ENCOUNTER — Other Ambulatory Visit: Payer: Self-pay | Admitting: Obstetrics and Gynecology

## 2022-01-14 DIAGNOSIS — Z1231 Encounter for screening mammogram for malignant neoplasm of breast: Secondary | ICD-10-CM

## 2022-01-25 DIAGNOSIS — Z5189 Encounter for other specified aftercare: Secondary | ICD-10-CM | POA: Diagnosis not present

## 2022-01-28 ENCOUNTER — Encounter: Payer: Self-pay | Admitting: Pharmacist

## 2022-01-28 DIAGNOSIS — I2510 Atherosclerotic heart disease of native coronary artery without angina pectoris: Secondary | ICD-10-CM

## 2022-01-28 DIAGNOSIS — E782 Mixed hyperlipidemia: Secondary | ICD-10-CM

## 2022-02-03 DIAGNOSIS — H6121 Impacted cerumen, right ear: Secondary | ICD-10-CM | POA: Diagnosis not present

## 2022-02-07 ENCOUNTER — Ambulatory Visit: Payer: Medicare HMO

## 2022-02-11 DIAGNOSIS — I10 Essential (primary) hypertension: Secondary | ICD-10-CM | POA: Diagnosis not present

## 2022-02-11 DIAGNOSIS — E669 Obesity, unspecified: Secondary | ICD-10-CM | POA: Diagnosis not present

## 2022-02-11 DIAGNOSIS — E785 Hyperlipidemia, unspecified: Secondary | ICD-10-CM | POA: Diagnosis not present

## 2022-02-11 DIAGNOSIS — R7301 Impaired fasting glucose: Secondary | ICD-10-CM | POA: Diagnosis not present

## 2022-02-16 ENCOUNTER — Ambulatory Visit
Admission: RE | Admit: 2022-02-16 | Discharge: 2022-02-16 | Disposition: A | Discharge status: Home / Self Care | Payer: Medicare HMO | Source: Ambulatory Visit | Attending: Obstetrics and Gynecology | Admitting: Obstetrics and Gynecology

## 2022-02-16 ENCOUNTER — Other Ambulatory Visit (HOSPITAL_COMMUNITY): Payer: Self-pay

## 2022-02-16 DIAGNOSIS — Z1231 Encounter for screening mammogram for malignant neoplasm of breast: Secondary | ICD-10-CM | POA: Diagnosis not present

## 2022-02-16 MED ORDER — OZEMPIC (0.25 OR 0.5 MG/DOSE) 2 MG/3ML ~~LOC~~ SOPN
0.2500 mg | PEN_INJECTOR | SUBCUTANEOUS | 0 refills | Status: DC
  Filled 2022-02-16: qty 3, 56d supply, fill #0

## 2022-02-17 ENCOUNTER — Other Ambulatory Visit: Payer: Self-pay

## 2022-02-18 NOTE — Addendum Note (Signed)
Addended by: Rollen Sox on: 02/18/2022 11:31 AM   Modules accepted: Orders

## 2022-02-21 ENCOUNTER — Other Ambulatory Visit (HOSPITAL_COMMUNITY): Payer: Self-pay

## 2022-02-28 ENCOUNTER — Encounter: Payer: Self-pay | Admitting: Internal Medicine

## 2022-03-01 ENCOUNTER — Other Ambulatory Visit: Payer: Self-pay | Admitting: *Deleted

## 2022-03-01 DIAGNOSIS — I2510 Atherosclerotic heart disease of native coronary artery without angina pectoris: Secondary | ICD-10-CM

## 2022-03-01 DIAGNOSIS — E782 Mixed hyperlipidemia: Secondary | ICD-10-CM | POA: Diagnosis not present

## 2022-03-01 LAB — LIPID PANEL
Chol/HDL Ratio: 3.3 ratio (ref 0.0–4.4)
Cholesterol, Total: 185 mg/dL (ref 100–199)
HDL: 56 mg/dL (ref >39)
LDL Chol Calc (NIH): 112 mg/dL — ABNORMAL HIGH (ref 0–99)
Triglycerides: 93 mg/dL (ref 0–149)
VLDL Cholesterol Cal: 17 mg/dL (ref 5–40)

## 2022-03-03 ENCOUNTER — Encounter: Payer: Self-pay | Admitting: Internal Medicine

## 2022-03-03 ENCOUNTER — Telehealth: Payer: Self-pay

## 2022-03-03 DIAGNOSIS — E782 Mixed hyperlipidemia: Secondary | ICD-10-CM

## 2022-03-03 NOTE — Telephone Encounter (Signed)
Referral placed for the Lipid Clinic.  

## 2022-03-03 NOTE — Telephone Encounter (Signed)
-----   Message from Fay Records, MD sent at 03/02/2022 11:52 PM EDT ----- With CAD on CT scan, LDL should be lower     Stop pravastatin given aches I would recomm Repatha 2x per month F/U lipomed panel and liver panel in 8 wks  LDL goal less than 70

## 2022-03-07 NOTE — Telephone Encounter (Signed)
Spoke with pt   Recomm REpatha trial   [place]  Has appt on 8/23

## 2022-03-09 ENCOUNTER — Other Ambulatory Visit (HOSPITAL_COMMUNITY): Payer: Self-pay

## 2022-03-09 MED ORDER — OZEMPIC (0.25 OR 0.5 MG/DOSE) 2 MG/3ML ~~LOC~~ SOPN
0.5000 mg | PEN_INJECTOR | SUBCUTANEOUS | 0 refills | Status: DC
Start: 2022-03-09 — End: 2022-09-20
  Filled 2022-03-09 – 2022-03-11 (×3): qty 3, 28d supply, fill #0

## 2022-03-11 ENCOUNTER — Other Ambulatory Visit (HOSPITAL_COMMUNITY): Payer: Self-pay

## 2022-03-14 DIAGNOSIS — Z809 Family history of malignant neoplasm, unspecified: Secondary | ICD-10-CM | POA: Diagnosis not present

## 2022-03-14 DIAGNOSIS — Z8249 Family history of ischemic heart disease and other diseases of the circulatory system: Secondary | ICD-10-CM | POA: Diagnosis not present

## 2022-03-14 DIAGNOSIS — I10 Essential (primary) hypertension: Secondary | ICD-10-CM | POA: Diagnosis not present

## 2022-03-14 DIAGNOSIS — Z888 Allergy status to other drugs, medicaments and biological substances status: Secondary | ICD-10-CM | POA: Diagnosis not present

## 2022-03-14 DIAGNOSIS — Z96649 Presence of unspecified artificial hip joint: Secondary | ICD-10-CM | POA: Diagnosis not present

## 2022-03-14 DIAGNOSIS — Z833 Family history of diabetes mellitus: Secondary | ICD-10-CM | POA: Diagnosis not present

## 2022-03-14 DIAGNOSIS — Z85828 Personal history of other malignant neoplasm of skin: Secondary | ICD-10-CM | POA: Diagnosis not present

## 2022-03-18 DIAGNOSIS — M2041 Other hammer toe(s) (acquired), right foot: Secondary | ICD-10-CM | POA: Diagnosis not present

## 2022-03-18 DIAGNOSIS — M21611 Bunion of right foot: Secondary | ICD-10-CM | POA: Diagnosis not present

## 2022-03-18 DIAGNOSIS — M7741 Metatarsalgia, right foot: Secondary | ICD-10-CM | POA: Diagnosis not present

## 2022-03-29 NOTE — Progress Notes (Unsigned)
Patient ID: Cynthia Padilla                 DOB: August 28, 1950                    MRN: [id number]     HPI: Cynthia Padilla is a 71 y.o. female patient referred to lipid clinic by Dr Harrington Challenger. PMH is significant for elevated calcium score 08/2018 of 51.9 (66th percentile based on age and sex matched cohort) with atherosclerosis noted in the aorta, FHx of CAD, HTN, HLD, thoracic aortic aneurysm, arthritis, anxiety, and GERD.  I saw pt in lipid clinic in 2021. At that time, she was intolerant to atorvastatin but was tolerating rosuvastatin 2.'5mg'$  daily well. She declined injectable medication and her rosuvastatin was increased to '5mg'$  daily which she did not tolerate. Her rosuvastatin was decreased back to 2.'5mg'$  daily however reported arm pain and stopped therapy. I started pt on pravastatin '20mg'$  3x/week. She ended up tolerating daily dosing well, however follow up LDL remained elevated at 100. She was started on Praluent in May 2023 but reported severe pain and swelling in her left elbow after 1 injection. She resumed pravastatin '20mg'$  daily but reported muscle aches and headaches and decreased her dose to every other day. Symptoms improved but still reported pain and weakness that was more pronounced in her right arm. LDL remained elevated, pt referred back to lipid clinic to discuss Repatha. Also advised to stop her pravastatin due to pain.  Still on prava 20? Said to d/c but still on med list Discuss repatha Can also try zetia if pt wishes Repatha non formulary, so is Nexlizet - copay prob higher ~95 if had to guess, PAs started but not submitted for both  How long off prava before recent labs checked? And had been taking every other day before then?  Current Medications: none Intolerances: pravastatin '20mg'$  daily, rosuvastatin 5-'10mg'$  daily, atorvastatin '10mg'$  daily, Livalo '1mg'$  daily - myalgias; Praluent '75mg'$  - pain in left elbow Risk Factors: elevated calcium score, FHx CAD LDL goal: '70mg'$ /dL  Diet:    Exercise: pilates  Family History: The patient's family history includes Diabetes in her brother, father, and sister; Heart disease in her father; Leukemia in her father; Lung cancer in her mother; Ovarian cancer in her mother; Rectal cancer in her sister.   Social History: The patient  reports that she has never smoked. She has never used smokeless tobacco. She reports current alcohol use of about 3.0 standard drinks of alcohol per week. She reports that she does not use drugs.   Labs: 03/01/22: TC 185, TG 93, HDL 56, LDL 112 (had been holding pravastatin) 12/01/21: TC 169, TG 101, HDL 49, LDL 100 (pravastatin '20mg'$  daily) 04/20/20: TC 173, TG 80, HDL 58, LDL 100 (pravastatin '20mg'$  daily) 07/08/19: TC 182, TG 111, HDL 60, LDL 102 (rosuvastatin 2.'5mg'$  daily)  Past Medical History:  Diagnosis Date   Anxiety    Arthritis    thinks she has some in her spine, bursitis in hips   Asthma    had it once when she had episode of bronchitis about 10 yrs ago   Diverticulitis    DIVERTICULOSIS, COLON 11/04/2004   Qualifier: Diagnosis of  By: Hardin Negus CMA Deborra Medina), [place]     GERD (gastroesophageal reflux disease)    Hepatitis    thinks she had Hepatits as a child   History of colonic polyps    Hyperlipidemia    Hypertension    Internal  hemorrhoids    PONV (postoperative nausea and vomiting)    states she gets deathly sick   Vertigo     Current Outpatient Medications on File Prior to Visit  Medication Sig Dispense Refill   acetaminophen (TYLENOL) 500 MG tablet Take 1,000 mg by mouth daily as needed for headache or moderate pain.     budesonide-formoterol (SYMBICORT) 80-4.5 MCG/ACT inhaler 1 puff as needed (allergies).     Cholecalciferol 25 MCG (1000 UT) capsule Take 1,000 Units by mouth every evening. Take 5 tablets by mouth daily     Coenzyme Q10 (COQ10) 150 MG CAPS Take 1 capsule by mouth 2 (two) times a week.     hydrochlorothiazide (HYDRODIURIL) 25 MG tablet Take 25 mg by mouth every  evening.     methocarbamol (ROBAXIN) 500 MG tablet Take 500 mg by mouth every 6 (six) hours as needed.     mupirocin ointment (BACTROBAN) 2 % Apply 1 application. topically daily.     ondansetron (ZOFRAN) 4 MG tablet Take 4 mg by mouth every 6 (six) hours as needed.     Polyethyl Glycol-Propyl Glycol (SYSTANE OP) Place 1 drop into both eyes at bedtime.     pravastatin (PRAVACHOL) 20 MG tablet Take 1 tablet (20 mg total) by mouth daily. 90 tablet 1   Probiotic Product ([employer]S PROBIOTIC) CAPS SMARTSIG:1 Capsule(s) By Mouth     Semaglutide,0.25 or 0.'5MG'$ /DOS, (OZEMPIC, 0.25 OR 0.5 MG/DOSE,) 2 MG/3ML SOPN Inject 0.5 mg into the skin once a week. 6 mL 0   traMADol (ULTRAM) 50 MG tablet Take 50-100 mg by mouth every 6 (six) hours as needed.     Current Facility-Administered Medications on File Prior to Visit  Medication Dose Route Frequency Provider Last Rate Last Admin   0.9 %  sodium chloride infusion  500 mL Intravenous Continuous Irene Shipper, MD        Allergies  Allergen Reactions   Atorvastatin Other (See Comments)    myalgia   Fluconazole Other (See Comments)   Pravastatin     myalgias   Rosuvastatin     Myalgias on 2.'5mg'$  and '5mg'$  dose   Rosuvastatin Calcium     Assessment/Plan:  1. Hyperlipidemia - LDL 112 above goal < 70 due to CAD. Pt intolerant to 4 statins and Praluent. Discussed trying Nexlizet vs Repatha.

## 2022-03-30 ENCOUNTER — Telehealth: Payer: Self-pay | Admitting: Pharmacist

## 2022-03-30 ENCOUNTER — Ambulatory Visit: Payer: Medicare HMO | Admitting: Pharmacist

## 2022-03-30 DIAGNOSIS — I2510 Atherosclerotic heart disease of native coronary artery without angina pectoris: Secondary | ICD-10-CM | POA: Diagnosis not present

## 2022-03-30 DIAGNOSIS — E782 Mixed hyperlipidemia: Secondary | ICD-10-CM | POA: Diagnosis not present

## 2022-03-30 MED ORDER — NEXLIZET 180-10 MG PO TABS: 1.0000 | ORAL_TABLET | Freq: Every day | ORAL | 11 refills | Status: DC

## 2022-03-30 NOTE — Patient Instructions (Addendum)
Your LDL cholesterol is 112 and your goal is < 70  I'll submit information to your insurance for Nexlizet and let you know once it's approved. This is a daily pill that lowers your LDL cholesterol by 40-50%. We would recheck your cholesterol when you see Dr Harrington Challenger in October  If you have trouble tolerating the medication, let us know and we can try Repatha or ezetimibe (one of the 2 medications in [place]) instead.        -Repatha is a twice monthly injection that lowers your LDL by 60%        -Ezetimibe is a daily pill that lowers your LDL by 20%

## 2022-03-30 NOTE — Telephone Encounter (Signed)
Nexlizet prior authorization approved through 08/07/22. Rx sent to pharmacy, pt aware and advised to call with any cost or tolerability issues, could pursue Repatha or ezetimibe if needed. Will recheck lipids at October f/u appt with Dr Harrington Challenger.

## 2022-04-06 ENCOUNTER — Encounter: Payer: Self-pay | Admitting: Internal Medicine

## 2022-04-07 ENCOUNTER — Encounter: Payer: Self-pay | Admitting: Internal Medicine

## 2022-04-07 DIAGNOSIS — R5383 Other fatigue: Secondary | ICD-10-CM | POA: Diagnosis not present

## 2022-04-07 DIAGNOSIS — G479 Sleep disorder, unspecified: Secondary | ICD-10-CM | POA: Diagnosis not present

## 2022-04-07 DIAGNOSIS — R062 Wheezing: Secondary | ICD-10-CM | POA: Diagnosis not present

## 2022-04-07 DIAGNOSIS — Z1152 Encounter for screening for COVID-19: Secondary | ICD-10-CM | POA: Diagnosis not present

## 2022-04-07 DIAGNOSIS — R058 Other specified cough: Secondary | ICD-10-CM | POA: Diagnosis not present

## 2022-04-13 DIAGNOSIS — Z85828 Personal history of other malignant neoplasm of skin: Secondary | ICD-10-CM | POA: Diagnosis not present

## 2022-04-13 DIAGNOSIS — L638 Other alopecia areata: Secondary | ICD-10-CM | POA: Diagnosis not present

## 2022-04-26 ENCOUNTER — Other Ambulatory Visit (HOSPITAL_COMMUNITY): Payer: Self-pay

## 2022-04-26 MED ORDER — OZEMPIC (0.25 OR 0.5 MG/DOSE) 2 MG/3ML ~~LOC~~ SOPN
0.5000 mg | PEN_INJECTOR | SUBCUTANEOUS | 3 refills | Status: DC
  Filled 2022-04-26: qty 3, 28d supply, fill #0

## 2022-04-28 ENCOUNTER — Other Ambulatory Visit (HOSPITAL_COMMUNITY): Payer: Self-pay

## 2022-04-29 ENCOUNTER — Other Ambulatory Visit: Payer: Self-pay | Admitting: Internal Medicine

## 2022-05-20 ENCOUNTER — Other Ambulatory Visit: Payer: Medicare HMO

## 2022-05-24 ENCOUNTER — Other Ambulatory Visit: Payer: Medicare HMO

## 2022-05-25 ENCOUNTER — Ambulatory Visit: Payer: Medicare HMO | Attending: Internal Medicine | Admitting: Internal Medicine

## 2022-05-25 ENCOUNTER — Other Ambulatory Visit: Payer: Medicare HMO

## 2022-05-25 ENCOUNTER — Encounter: Payer: Self-pay | Admitting: Internal Medicine

## 2022-05-25 ENCOUNTER — Ambulatory Visit: Payer: Medicare HMO

## 2022-05-25 VITALS — BP 112/64 | HR 64 | Ht 68.0 in | Wt 185.0 lb

## 2022-05-25 DIAGNOSIS — R911 Solitary pulmonary nodule: Secondary | ICD-10-CM | POA: Diagnosis not present

## 2022-05-25 DIAGNOSIS — E782 Mixed hyperlipidemia: Secondary | ICD-10-CM

## 2022-05-25 DIAGNOSIS — Z79899 Other long term (current) drug therapy: Secondary | ICD-10-CM | POA: Diagnosis not present

## 2022-05-25 LAB — HEPATIC FUNCTION PANEL
ALT: 19 IU/L (ref 0–32)
AST: 27 IU/L (ref 0–40)
Albumin: 4.8 g/dL (ref 3.8–4.8)
Alkaline Phosphatase: 71 IU/L (ref 44–121)
Bilirubin Total: 0.5 mg/dL (ref 0.0–1.2)
Bilirubin, Direct: 0.2 mg/dL (ref 0.00–0.40)
Total Protein: 6.9 g/dL (ref 6.0–8.5)

## 2022-05-25 LAB — LIPID PANEL
Chol/HDL Ratio: 2.6 ratio (ref 0.0–4.4)
Cholesterol, Total: 143 mg/dL (ref 100–199)
HDL: 56 mg/dL (ref >39)
LDL Chol Calc (NIH): 72 mg/dL (ref 0–99)
Triglycerides: 77 mg/dL (ref 0–149)
VLDL Cholesterol Cal: 15 mg/dL (ref 5–40)

## 2022-05-25 NOTE — Patient Instructions (Addendum)
Medication Instructions:   *If you need a refill on your cardiac medications before your next appointment, please call your pharmacy*   Lab Work: NMR, APO B, LIPO A, HEPATIC AS PLANNED  If you have labs (blood work) drawn today and your tests are completely normal, you will receive your results only by: [place] (if you have MyChart) OR A paper copy in the mail If you have any lab test that is abnormal or we need to change your treatment, we will call you to review the results.    Testing/Procedures:  Low Dose non contrast chest CT     Follow-Up: At [facility], you and your health needs are our priority.  As part of our continuing mission to provide you with exceptional heart care, we have created designated Provider Care Teams.  These Care Teams include your primary Cardiologist (physician) and Advanced Practice Providers (APPs -  Physician Assistants and Nurse Practitioners) who all work together to provide you with the care you need, when you need it.  We recommend signing up for the patient portal called "MyChart".  Sign up information is provided on this After Visit Summary.  MyChart is used to connect with patients for Virtual Visits (Telemedicine).  Patients are able to view lab/test results, encounter notes, upcoming appointments, etc.  Non-urgent messages can be sent to your provider as well.   To learn more about what you can do with MyChart, go to [email or web address].    Your next appointment:   6 month(s)  The format for your next appointment:   In Person  Provider:   Dorris Carnes, MD     Other Instructions   Important Information About Sugar

## 2022-05-25 NOTE — Progress Notes (Unsigned)
Cardiology Office Note   Date:  05/25/2022   ID:  Boots, Mcglown 1950-12-22, MRN [id number]  PCP:  Velna Hatchet, MD  Cardiologist:   Dorris Carnes, MD    Pt presents for f/u of CAD and HL    History of Present Illness: Cynthia Padilla is a 71 y.o. female with a history of chest pain   I saw her in 2017  Pain was atypical for coronary ischemia    Given strong FHx of CAD I recomm a calcium score CT  She had this in Jan 2020 Ca score was 51.9   The aorta was noted to be 41 mm  Atherosclerosis of aorta noted  I last saw the pt in 2021    Since seen she says she is doing OK   she is on Ozempic expensive   Has lost about 10lbs Breathing is OK  No CP      Diet Bre:  Yogurt drink (chobani Strawberr7 Lunch   [place] and cheezse sandiwich on deligthfut breasd  Or salsa Dinner   Salad   low fat ranchor 1/2 burguer  ior ve Drinks   Water   Coffee black    Glass or 2 on weekends   [employer]    Current Outpatient Medications  Medication Sig Dispense Refill   acetaminophen (TYLENOL) 500 MG tablet Take 1,000 mg by mouth daily as needed for headache or moderate pain.     Bempedoic Acid-Ezetimibe (NEXLIZET) 180-10 MG TABS Take 1 tablet by mouth daily. 30 tablet 11   budesonide-formoterol (SYMBICORT) 80-4.5 MCG/ACT inhaler 1 puff as needed (allergies).     Cholecalciferol 25 MCG (1000 UT) capsule Take 1,000 Units by mouth every evening. Take 5 tablets by mouth daily     Coenzyme Q10 (COQ10) 150 MG CAPS Take 1 capsule by mouth 2 (two) times a week.     hydrochlorothiazide (HYDRODIURIL) 25 MG tablet Take 25 mg by mouth every evening.     mupirocin ointment (BACTROBAN) 2 % Apply 1 application. topically daily.     Polyethyl Glycol-Propyl Glycol (SYSTANE OP) Place 1 drop into both eyes at bedtime.     Probiotic Product ([employer]S PROBIOTIC) CAPS SMARTSIG:1 Capsule(s) By Mouth     Semaglutide,0.25 or 0.'5MG'$ /DOS, (OZEMPIC, 0.25 OR 0.5 MG/DOSE,) 2 MG/3ML SOPN Inject 0.5 mg into the skin once a week. 6  mL 0   Semaglutide,0.25 or 0.'5MG'$ /DOS, (OZEMPIC, 0.25 OR 0.5 MG/DOSE,) 2 MG/3ML SOPN Inject 0.5 mg into the skin once a week. 6 mL 3   Current Facility-Administered Medications  Medication Dose Route Frequency Provider Last Rate Last Admin   0.9 %  sodium chloride infusion  500 mL Intravenous Continuous Irene Shipper, MD        Allergies:   Atorvastatin, Fluconazole, Livalo [pitavastatin], Praluent [alirocumab], Pravastatin, Rosuvastatin, and Rosuvastatin calcium   Past Medical History:  Diagnosis Date   Anxiety    Arthritis    thinks she has some in her spine, bursitis in hips   Asthma    had it once when she had episode of bronchitis about 10 yrs ago   Diverticulitis    DIVERTICULOSIS, COLON 11/04/2004   Qualifier: Diagnosis of  By: Hardin Negus CMA Deborra Medina), Colletta [place]     GERD (gastroesophageal reflux disease)    Hepatitis    thinks she had Hepatits as a child   History of colonic polyps    Hyperlipidemia    Hypertension    Internal hemorrhoids    PONV (postoperative nausea  and vomiting)    states she gets deathly sick   Vertigo     Past Surgical History:  Procedure Laterality Date   BREAST BIOPSY Right    ELBOW FRACTURE SURGERY Left    HERNIA REPAIR     pt denies   LAMINECTOMY  10/11/11   with fusion @ L4-5   RETINAL TEAR REPAIR CRYOTHERAPY Right 2015   TUBAL LIGATION       Social History:  The patient  reports that she has never smoked. She has never used smokeless tobacco. She reports current alcohol use of about 3.0 standard drinks of alcohol per week. She reports that she does not use drugs.   Family History:  The patient's family history includes Diabetes in her brother, father, and sister; Heart disease in her father; Leukemia in her father; Lung cancer in her mother; Ovarian cancer in her mother; Rectal cancer in her sister.    ROS:  Please see the history of present illness. All other systems are reviewed and  Negative to the above problem except as noted.     PHYSICAL EXAM: VS:  BP 112/64   Pulse 64   Ht '5\' 8"'$  (1.727 m)   Wt 185 lb (83.9 kg)   SpO2 98%   BMI 28.13 kg/m   GEN: Well nourished, well developed, in no acute distress  HEENT: normal  Neck: JVP is normal  No bruits Cardiac: RRR; no murmurs  No LE  edema  Respiratory:  clear to auscultation bilaterally, normal work of breathing GI: soft, nontender, nondistended, + BS  No hepatomegaly  MS: no deformity Moving all extremities   Skin: warm and dry, no rash Neuro:  Strength and sensation are intact Psych: euthymic mood, full affect   EKG:  EKG not done     Lipid Panel    Component Value Date/Time   CHOL 185 03/01/2022 1036   TRIG 93 03/01/2022 1036   HDL 56 03/01/2022 1036   CHOLHDL 3.3 03/01/2022 1036   CHOLHDL 3 01/29/2014 0813   VLDL 20.2 01/29/2014 0813   LDLCALC 112 (H) 03/01/2022 1036   LDLDIRECT 125.6 03/28/2012 0922      Wt Readings from Last 3 Encounters:  05/25/22 185 lb (83.9 kg)  12/15/21 192 lb 9.6 oz (87.4 kg)  12/01/21 195 lb 3.2 oz (88.5 kg)      ASSESSMENT AND PLAN:  1  CAD  Pt with mild plaqing on CT scan   Remains asymptomatic     2  Aortic dilitation Repeat CT scan aorta was 3.8 cm     Note very small nodule noted peripherally   5 mm   Will go ahead and get noncontrast CT for f/u   3  HTN   BP is well controlled     4  HL  Pt has been seen in lipid clinic   Currently on Nexlizet   Wll get lipomed     5  Pulmonary  Follws in [place]        Signed, Dorris Carnes, MD  05/25/2022 10:01 AM    [address], [place], [place]  [address] Phone: [phone]; Fax: [phone]

## 2022-05-26 ENCOUNTER — Other Ambulatory Visit (HOSPITAL_COMMUNITY): Payer: Self-pay

## 2022-05-26 LAB — NMR, LIPOPROFILE
Cholesterol, Total: 143 mg/dL (ref 100–199)
HDL Particle Number: 42.1 umol/L (ref >=30.5)
HDL-C: 59 mg/dL (ref >39)
LDL Particle Number: 813 nmol/L (ref <1000)
LDL Size: 21 nm (ref >20.5)
LDL-C (NIH Calc): 69 mg/dL (ref 0–99)
LP-IR Score: 50 — ABNORMAL HIGH (ref <=45)
Small LDL Particle Number: 463 nmol/L (ref <=527)
Triglycerides: 76 mg/dL (ref 0–149)

## 2022-05-26 LAB — APOLIPOPROTEIN B: Apolipoprotein B: 59 mg/dL (ref <90)

## 2022-05-26 LAB — LIPOPROTEIN A (LPA): Lipoprotein (a): 199.7 nmol/L — ABNORMAL HIGH (ref <75.0)

## 2022-05-26 MED ORDER — OZEMPIC (1 MG/DOSE) 4 MG/3ML ~~LOC~~ SOPN
1.0000 mg | PEN_INJECTOR | SUBCUTANEOUS | 2 refills | Status: DC
  Filled 2022-05-26: qty 3, 28d supply, fill #0

## 2022-05-28 ENCOUNTER — Encounter: Payer: Self-pay | Admitting: Internal Medicine

## 2022-05-30 NOTE — Telephone Encounter (Signed)
Reviewed with pt

## 2022-05-30 NOTE — Telephone Encounter (Signed)
Reviewed

## 2022-06-02 ENCOUNTER — Ambulatory Visit (HOSPITAL_BASED_OUTPATIENT_CLINIC_OR_DEPARTMENT_OTHER)
Admission: RE | Admit: 2022-06-02 | Discharge: 2022-06-02 | Disposition: A | Discharge status: Home / Self Care | Payer: Medicare HMO | Source: Ambulatory Visit | Attending: Internal Medicine | Admitting: Internal Medicine

## 2022-06-02 DIAGNOSIS — I700 Atherosclerosis of aorta: Secondary | ICD-10-CM | POA: Diagnosis not present

## 2022-06-02 DIAGNOSIS — R911 Solitary pulmonary nodule: Secondary | ICD-10-CM | POA: Insufficient documentation

## 2022-06-06 ENCOUNTER — Encounter: Payer: Self-pay | Admitting: Internal Medicine

## 2022-06-09 NOTE — Telephone Encounter (Signed)
Reviewed with pt   Aorta measures WNL

## 2022-06-16 ENCOUNTER — Ambulatory Visit (HOSPITAL_BASED_OUTPATIENT_CLINIC_OR_DEPARTMENT_OTHER): Payer: Medicare HMO

## 2022-06-28 ENCOUNTER — Other Ambulatory Visit (HOSPITAL_COMMUNITY): Payer: Self-pay

## 2022-06-28 MED ORDER — OZEMPIC (1 MG/DOSE) 4 MG/3ML ~~LOC~~ SOPN
1.0000 mg | PEN_INJECTOR | SUBCUTANEOUS | 2 refills | Status: DC
  Filled 2022-06-28: qty 3, 28d supply, fill #0

## 2022-07-06 DIAGNOSIS — D1801 Hemangioma of skin and subcutaneous tissue: Secondary | ICD-10-CM | POA: Diagnosis not present

## 2022-07-06 DIAGNOSIS — Z85828 Personal history of other malignant neoplasm of skin: Secondary | ICD-10-CM | POA: Diagnosis not present

## 2022-07-06 DIAGNOSIS — L814 Other melanin hyperpigmentation: Secondary | ICD-10-CM | POA: Diagnosis not present

## 2022-07-06 DIAGNOSIS — L821 Other seborrheic keratosis: Secondary | ICD-10-CM | POA: Diagnosis not present

## 2022-07-12 ENCOUNTER — Telehealth: Payer: Self-pay | Admitting: Internal Medicine

## 2022-07-12 DIAGNOSIS — H938X3 Other specified disorders of ear, bilateral: Secondary | ICD-10-CM | POA: Diagnosis not present

## 2022-07-12 NOTE — Telephone Encounter (Signed)
Left message for pt to call back  °

## 2022-07-12 NOTE — Telephone Encounter (Signed)
Inbound call from patient concerned about waiting until 2028 for colonoscopy. States she has family members who have had colon cancer. She is requesting a call back to further advise.

## 2022-07-14 NOTE — Telephone Encounter (Signed)
Cynthia Padilla, I believe that her sister had AIN which is a precancerous anal canal lesion.  This is not colon cancer. Get her sister's names that I can confirm.  (I believe she is my patient). If it is a different patient, okay Will go from there.  Thanks Dr. Henrene Pastor

## 2022-07-14 NOTE — Telephone Encounter (Signed)
Spoke with pt and she states her sister passed away in Oct 24, 2012 and was not seen here, she was seen in [facility].

## 2022-07-14 NOTE — Telephone Encounter (Signed)
Pt called and states she is nervous about waiting the 10 years for her next colon as her oldest sister had colon cancer. Please advise if her recall needs to be adjusted due to family hx.

## 2022-07-15 NOTE — Telephone Encounter (Signed)
Thank you, and her, for the clarification. With her sister having had colon cancer, she would be due for follow-up colonoscopy at this time. She can be set up for direct colonoscopy in the [facility]. Thanks Dr. Henrene Pastor

## 2022-07-19 ENCOUNTER — Encounter: Payer: Self-pay | Admitting: Internal Medicine

## 2022-07-19 NOTE — Telephone Encounter (Signed)
Please see note below from Dr. Henrene Pastor, pt needs to be scheduled for colon in the [facility]. Please contact pt to schedule colon appt.

## 2022-07-19 NOTE — Telephone Encounter (Signed)
Patient has been scheduled

## 2022-07-22 ENCOUNTER — Other Ambulatory Visit (HOSPITAL_COMMUNITY): Payer: Self-pay

## 2022-07-22 MED ORDER — OZEMPIC (1 MG/DOSE) 4 MG/3ML ~~LOC~~ SOPN
1.0000 mg | PEN_INJECTOR | SUBCUTANEOUS | 2 refills | Status: DC
  Filled 2022-07-22: qty 3, 28d supply, fill #0

## 2022-08-03 ENCOUNTER — Other Ambulatory Visit (HOSPITAL_BASED_OUTPATIENT_CLINIC_OR_DEPARTMENT_OTHER): Payer: Self-pay | Admitting: Adult Health

## 2022-08-03 DIAGNOSIS — R221 Localized swelling, mass and lump, neck: Secondary | ICD-10-CM

## 2022-08-03 DIAGNOSIS — R590 Localized enlarged lymph nodes: Secondary | ICD-10-CM | POA: Diagnosis not present

## 2022-08-03 DIAGNOSIS — E663 Overweight: Secondary | ICD-10-CM | POA: Diagnosis not present

## 2022-08-04 ENCOUNTER — Ambulatory Visit (HOSPITAL_BASED_OUTPATIENT_CLINIC_OR_DEPARTMENT_OTHER)
Admission: RE | Admit: 2022-08-04 | Discharge: 2022-08-04 | Disposition: A | Discharge status: Home / Self Care | Payer: Medicare HMO | Source: Ambulatory Visit | Attending: Adult Health | Admitting: Adult Health

## 2022-08-04 DIAGNOSIS — R590 Localized enlarged lymph nodes: Secondary | ICD-10-CM | POA: Insufficient documentation

## 2022-08-04 DIAGNOSIS — R221 Localized swelling, mass and lump, neck: Secondary | ICD-10-CM | POA: Insufficient documentation

## 2022-08-05 ENCOUNTER — Other Ambulatory Visit (HOSPITAL_BASED_OUTPATIENT_CLINIC_OR_DEPARTMENT_OTHER): Payer: Self-pay | Admitting: Internal Medicine

## 2022-08-05 DIAGNOSIS — R221 Localized swelling, mass and lump, neck: Secondary | ICD-10-CM

## 2022-08-09 ENCOUNTER — Ambulatory Visit (HOSPITAL_BASED_OUTPATIENT_CLINIC_OR_DEPARTMENT_OTHER)
Admission: RE | Admit: 2022-08-09 | Discharge: 2022-08-09 | Disposition: A | Discharge status: Home / Self Care | Payer: Medicare HMO | Source: Ambulatory Visit | Attending: Internal Medicine | Admitting: Internal Medicine

## 2022-08-09 DIAGNOSIS — R221 Localized swelling, mass and lump, neck: Secondary | ICD-10-CM

## 2022-08-09 DIAGNOSIS — R69 Illness, unspecified: Secondary | ICD-10-CM | POA: Diagnosis not present

## 2022-08-09 LAB — POCT I-STAT CREATININE: Creatinine, Ser: 0.9 mg/dL (ref 0.44–1.00)

## 2022-08-09 MED ORDER — IOHEXOL 300 MG/ML  SOLN
75.0000 mL | Freq: Once | INTRAMUSCULAR | Status: AC | PRN
  Administered 2022-08-09: 75 mL via INTRAVENOUS

## 2022-08-11 ENCOUNTER — Other Ambulatory Visit (HOSPITAL_COMMUNITY): Payer: Self-pay

## 2022-08-11 DIAGNOSIS — R69 Illness, unspecified: Secondary | ICD-10-CM | POA: Diagnosis not present

## 2022-08-11 MED ORDER — OZEMPIC (1 MG/DOSE) 4 MG/3ML ~~LOC~~ SOPN
1.0000 mg | PEN_INJECTOR | SUBCUTANEOUS | 2 refills | Status: DC
  Filled 2022-08-11: qty 3, 28d supply, fill #0

## 2022-08-15 ENCOUNTER — Ambulatory Visit (AMBULATORY_SURGERY_CENTER): Payer: Medicare HMO | Admitting: *Deleted

## 2022-08-15 ENCOUNTER — Other Ambulatory Visit (HOSPITAL_COMMUNITY): Payer: Self-pay

## 2022-08-15 VITALS — Ht 68.0 in | Wt 173.0 lb

## 2022-08-15 DIAGNOSIS — Z86010 Personal history of colonic polyps: Secondary | ICD-10-CM

## 2022-08-15 MED ORDER — NA SULFATE-K SULFATE-MG SULF 17.5-3.13-1.6 GM/177ML PO SOLN: 1.0000 | Freq: Once | ORAL | 0 refills | Status: AC

## 2022-08-15 MED ORDER — ONDANSETRON HCL 4 MG PO TABS: 4.0000 mg | ORAL_TABLET | Freq: Three times a day (TID) | ORAL | 0 refills | Status: DC | PRN

## 2022-08-15 NOTE — Progress Notes (Signed)
No egg or soy allergy known to patient  No issues known to pt with past sedation with any surgeries or procedures Patient denies ever being told they had issues or difficulty with intubation  No FH of Malignant Hyperthermia Pt is not on diet pills Pt is not on  home 02  Pt is not on blood thinners  Pt denies issues with constipation  Pt is not on dialysis Pt denies any upcoming cardiac testing Pt encouraged to use to use Singlecare or Goodrx to reduce cost  Patient's chart reviewed by Osvaldo Angst CNRA prior to previsit and patient appropriate for the [facility].  Previsit completed and red dot placed by patient's name on their procedure day (on provider's schedule).  . Visit by phone Instructions sent by mail with coupon if applicable  andsent through my chart

## 2022-08-16 DIAGNOSIS — R69 Illness, unspecified: Secondary | ICD-10-CM | POA: Diagnosis not present

## 2022-08-18 DIAGNOSIS — R69 Illness, unspecified: Secondary | ICD-10-CM | POA: Diagnosis not present

## 2022-08-18 DIAGNOSIS — H2513 Age-related nuclear cataract, bilateral: Secondary | ICD-10-CM | POA: Diagnosis not present

## 2022-08-18 DIAGNOSIS — H31001 Unspecified chorioretinal scars, right eye: Secondary | ICD-10-CM | POA: Diagnosis not present

## 2022-08-18 DIAGNOSIS — H04123 Dry eye syndrome of bilateral lacrimal glands: Secondary | ICD-10-CM | POA: Diagnosis not present

## 2022-08-18 DIAGNOSIS — H5203 Hypermetropia, bilateral: Secondary | ICD-10-CM | POA: Diagnosis not present

## 2022-08-23 DIAGNOSIS — R69 Illness, unspecified: Secondary | ICD-10-CM | POA: Diagnosis not present

## 2022-08-25 DIAGNOSIS — R69 Illness, unspecified: Secondary | ICD-10-CM | POA: Diagnosis not present

## 2022-08-30 ENCOUNTER — Encounter: Payer: Self-pay | Admitting: Internal Medicine

## 2022-08-30 DIAGNOSIS — R69 Illness, unspecified: Secondary | ICD-10-CM | POA: Diagnosis not present

## 2022-09-01 DIAGNOSIS — R69 Illness, unspecified: Secondary | ICD-10-CM | POA: Diagnosis not present

## 2022-09-05 ENCOUNTER — Encounter: Payer: Medicare HMO | Admitting: Internal Medicine

## 2022-09-12 ENCOUNTER — Encounter: Payer: Self-pay | Admitting: Internal Medicine

## 2022-09-13 DIAGNOSIS — R69 Illness, unspecified: Secondary | ICD-10-CM | POA: Diagnosis not present

## 2022-09-20 ENCOUNTER — Encounter: Payer: Self-pay | Admitting: Internal Medicine

## 2022-09-20 ENCOUNTER — Ambulatory Visit (AMBULATORY_SURGERY_CENTER): Payer: Medicare HMO | Admitting: Internal Medicine

## 2022-09-20 VITALS — BP 126/64 | HR 61 | Temp 98.7°F | Resp 13 | Ht 68.0 in | Wt 173.0 lb

## 2022-09-20 DIAGNOSIS — Z09 Encounter for follow-up examination after completed treatment for conditions other than malignant neoplasm: Secondary | ICD-10-CM | POA: Diagnosis not present

## 2022-09-20 DIAGNOSIS — I10 Essential (primary) hypertension: Secondary | ICD-10-CM | POA: Diagnosis not present

## 2022-09-20 DIAGNOSIS — Z86010 Personal history of colonic polyps: Secondary | ICD-10-CM | POA: Diagnosis not present

## 2022-09-20 DIAGNOSIS — R69 Illness, unspecified: Secondary | ICD-10-CM | POA: Diagnosis not present

## 2022-09-20 MED ORDER — SODIUM CHLORIDE 0.9 % IV SOLN: 500.0000 mL | Freq: Once | INTRAVENOUS | Status: DC

## 2022-09-20 NOTE — Op Note (Signed)
[facility] Patient Name: Cynthia Padilla Procedure Date: 09/20/2022 2:40 PM MRN: [id number] Endoscopist: Docia Chuck. Henrene Pastor , MD, [id number] Age: 72 Referring MD:  Date of Birth: 12-Jul-1951 Gender: Female Account #: [id number] Procedure:                Colonoscopy Indications:              High risk colon cancer surveillance: Personal                            history of sessile serrated colon polyp (less than                            10 mm in size) with no dysplasia. Family history of                            colon cancer in sister in her 69s. Examinations                            2013, 2018 Medicines:                Monitored Anesthesia Care Procedure:                Pre-Anesthesia Assessment:                           - Prior to the procedure, a History and Physical                            was performed, and patient medications and                            allergies were reviewed. The patient's tolerance of                            previous anesthesia was also reviewed. The risks                            and benefits of the procedure and the sedation                            options and risks were discussed with the patient.                            All questions were answered, and informed consent                            was obtained. Prior Anticoagulants: The patient has                            taken no anticoagulant or antiplatelet agents. ASA                            Grade Assessment: II - A patient with mild systemic  disease. After reviewing the risks and benefits,                            the patient was deemed in satisfactory condition to                            undergo the procedure.                           After obtaining informed consent, the colonoscope                            was passed under direct vision. Throughout the                            procedure, the patient's blood pressure, pulse, and                             oxygen saturations were monitored continuously. The                            Olympus CF-HQ190L [id number]) Colonoscope was                            introduced through the anus and advanced to the the                            cecum, identified by appendiceal orifice and                            ileocecal valve. The ileocecal valve, appendiceal                            orifice, and rectum were photographed. The quality                            of the bowel preparation was excellent. The                            colonoscopy was performed without difficulty. The                            patient tolerated the procedure well. The bowel                            preparation used was SUPREP via split dose                            instruction. Scope In: 2:53:43 PM Scope Out: 3:11:09 PM Scope Withdrawal Time: 0 hours 10 minutes 30 seconds  Total Procedure Duration: 0 hours 17 minutes 26 seconds  Findings:                 Multiple diverticula were found in the left colon  and right colon.                           Internal hemorrhoids were found during retroflexion.                           The exam was otherwise without abnormality on                            direct and retroflexion views. Complications:            No immediate complications. Estimated blood loss:                            None. Estimated Blood Loss:     Estimated blood loss: none. Impression:               - Diverticulosis in the left colon and in the right                            colon.                           - Internal hemorrhoids.                           - The examination was otherwise normal on direct                            and retroflexion views.                           - No specimens collected. Recommendation:           - Repeat colonoscopy in 5 years for surveillance                            (family history).                            - Patient has a contact number available for                            emergencies. The signs and symptoms of potential                            delayed complications were discussed with the                            patient. Return to normal activities tomorrow.                            Written discharge instructions were provided to the                            patient.                           -  Resume previous diet.                           - Continue present medications. Docia Chuck. Henrene Pastor, MD 09/20/2022 3:21:41 PM This report has been signed electronically.

## 2022-09-20 NOTE — Patient Instructions (Signed)
YOU HAD AN ENDOSCOPIC PROCEDURE TODAY AT THE [facility]:   Refer to the procedure report that was given to you for any specific questions about what was found during the examination.  If the procedure report does not answer your questions, please call your gastroenterologist to clarify.  If you requested that your care partner not be given the details of your procedure findings, then the procedure report has been included in a sealed envelope for you to review at your convenience later.  YOU SHOULD EXPECT: Some feelings of bloating in the abdomen. Passage of more gas than usual.  Walking can help get rid of the air that was put into your GI tract during the procedure and reduce the bloating. If you had a lower endoscopy (such as a colonoscopy or flexible sigmoidoscopy) you may notice spotting of blood in your stool or on the toilet paper. If you underwent a bowel prep for your procedure, you may not have a normal bowel movement for a few days.  Please Note:  You might notice some irritation and congestion in your nose or some drainage.  This is from the oxygen used during your procedure.  There is no need for concern and it should clear up in a day or so.  SYMPTOMS TO REPORT IMMEDIATELY:  Following lower endoscopy (colonoscopy or flexible sigmoidoscopy):  Excessive amounts of blood in the stool  Significant tenderness or worsening of abdominal pains  Swelling of the abdomen that is new, acute  Fever of 100F or higher  For urgent or emergent issues, a gastroenterologist can be reached at any hour by calling [phone]. Do not use MyChart messaging for urgent concerns.    DIET:  We do recommend a small meal at first, but then you may proceed to your regular diet.  Drink plenty of fluids but you should avoid alcoholic beverages for 24 hours.  ACTIVITY:  You should plan to take it easy for the rest of today and you should NOT DRIVE or use heavy machinery until tomorrow (because of  the sedation medicines used during the test).    FOLLOW UP: Our staff will call the number listed on your records the next business day following your procedure.  We will call around 7:15- 8:00 am to check on you and address any questions or concerns that you may have regarding the information given to you following your procedure. If we do not reach you, we will leave a message.     If any biopsies were taken you will be contacted by phone or by letter within the next 1-3 weeks.  Please call us at [phone] if you have not heard about the biopsies in 3 weeks.    SIGNATURES/CONFIDENTIALITY: You and/or your care partner have signed paperwork which will be entered into your electronic medical record.  These signatures attest to the fact that that the information above on your After Visit Summary has been reviewed and is understood.  Full responsibility of the confidentiality of this discharge information lies with you and/or your care-partner.  

## 2022-09-20 NOTE — Progress Notes (Signed)
Vss nad trans to pacu °

## 2022-09-20 NOTE — Progress Notes (Signed)
HISTORY OF PRESENT ILLNESS:  Cynthia Padilla is a 72 y.o. female who presents today for surveillance colonoscopy.  Personal history of sessile serrated polyp.  Sister with colon cancer.  Last examination 2018  REVIEW OF SYSTEMS:  All non-GI ROS negative except for  Past Medical History:  Diagnosis Date   Anxiety    Arthritis    thinks she has some in her spine, bursitis in hips   Asthma    had it once when she had episode of bronchitis about 10 yrs ago   Diverticulitis    DIVERTICULOSIS, COLON 11/04/2004   Qualifier: Diagnosis of  By: Hardin Negus CMA (AAMA), Colletta [place]     GERD (gastroesophageal reflux disease)    Hepatitis    thinks she had Hepatits as a child   History of colonic polyps    Hyperlipidemia    Hypertension    Internal hemorrhoids    PONV (postoperative nausea and vomiting)    states she gets deathly sick   Vertigo     Past Surgical History:  Procedure Laterality Date   BREAST BIOPSY Right    ELBOW FRACTURE SURGERY Left    HERNIA REPAIR     pt denies   HIP SURGERY     L hip total, May 2023   LAMINECTOMY  10/11/2011   with fusion @ L4-5   RETINAL TEAR REPAIR CRYOTHERAPY Right 2015   TUBAL LIGATION      Social History Cynthia Padilla  reports that she has never smoked. She has never used smokeless tobacco. She reports current alcohol use of about 3.0 standard drinks of alcohol per week. She reports that she does not use drugs.  family history includes Colon cancer in her sister; Diabetes in her brother, father, and sister; Heart disease in her father; Leukemia in her father; Lung cancer in her mother; Ovarian cancer in her mother; Rectal cancer in her sister.  Allergies  Allergen Reactions   Atorvastatin Other (See Comments)    Myalgias on 34m daily dosing   Fluconazole Other (See Comments)   Livalo [Pitavastatin]     Myalgias on 119mdaily dosing   Praluent [Alirocumab]     Elbow pain on single injection of 7541m Pravastatin     Myalgias on  36m79mily and every other day   Rosuvastatin     Myalgias on 2.5mg 25m 5mg d76m   Rosuvastatin Calcium        PHYSICAL EXAMINATION: Vital signs: BP 130/74   Pulse 65   Temp 98.7 F (37.1 C)   Ht 5' 8"$  (1.727 m)   Wt 173 lb (78.5 kg)   SpO2 100%   BMI 26.30 kg/m  General: Well-developed, well-nourished, no acute distress HEENT: Sclerae are anicteric, conjunctiva pink. Oral mucosa intact Lungs: Clear Heart: Regular Abdomen: soft, nontender, nondistended, no obvious ascites, no peritoneal signs, normal bowel sounds. No organomegaly. Extremities: No edema Psychiatric: alert and oriented x3. Cooperative     ASSESSMENT:  Personal history of sessile serrated polyp Family history of colon cancer in first-degree relative   PLAN:  Surveillance colonoscopy

## 2022-09-21 ENCOUNTER — Telehealth: Payer: Self-pay | Admitting: *Deleted

## 2022-09-21 NOTE — Telephone Encounter (Signed)
  Follow up Call-     09/20/2022    2:06 PM  Call back number  Post procedure Call Back phone  # [phone]  Permission to leave phone message Yes     Patient questions:  Do you have a fever, pain , or abdominal swelling? No. Pain Score  0 *  Have you tolerated food without any problems? Yes.    Have you been able to return to your normal activities? Yes.    Do you have any questions about your discharge instructions: Diet   No. Medications  No. Follow up visit  No.  Do you have questions or concerns about your Care? No.  Actions: * If pain score is 4 or above: No action needed, pain <4.

## 2022-10-04 DIAGNOSIS — R69 Illness, unspecified: Secondary | ICD-10-CM | POA: Diagnosis not present

## 2022-10-05 DIAGNOSIS — R7989 Other specified abnormal findings of blood chemistry: Secondary | ICD-10-CM | POA: Diagnosis not present

## 2022-10-05 DIAGNOSIS — R7301 Impaired fasting glucose: Secondary | ICD-10-CM | POA: Diagnosis not present

## 2022-10-05 DIAGNOSIS — K219 Gastro-esophageal reflux disease without esophagitis: Secondary | ICD-10-CM | POA: Diagnosis not present

## 2022-10-05 DIAGNOSIS — I10 Essential (primary) hypertension: Secondary | ICD-10-CM | POA: Diagnosis not present

## 2022-10-05 DIAGNOSIS — E785 Hyperlipidemia, unspecified: Secondary | ICD-10-CM | POA: Diagnosis not present

## 2022-10-06 DIAGNOSIS — R69 Illness, unspecified: Secondary | ICD-10-CM | POA: Diagnosis not present

## 2022-10-11 DIAGNOSIS — R69 Illness, unspecified: Secondary | ICD-10-CM | POA: Diagnosis not present

## 2022-10-12 DIAGNOSIS — I2510 Atherosclerotic heart disease of native coronary artery without angina pectoris: Secondary | ICD-10-CM | POA: Diagnosis not present

## 2022-10-12 DIAGNOSIS — Z0000 Encounter for general adult medical examination without abnormal findings: Secondary | ICD-10-CM | POA: Diagnosis not present

## 2022-10-12 DIAGNOSIS — Z1339 Encounter for screening examination for other mental health and behavioral disorders: Secondary | ICD-10-CM | POA: Diagnosis not present

## 2022-10-12 DIAGNOSIS — R7301 Impaired fasting glucose: Secondary | ICD-10-CM | POA: Diagnosis not present

## 2022-10-12 DIAGNOSIS — I10 Essential (primary) hypertension: Secondary | ICD-10-CM | POA: Diagnosis not present

## 2022-10-12 DIAGNOSIS — E669 Obesity, unspecified: Secondary | ICD-10-CM | POA: Diagnosis not present

## 2022-10-12 DIAGNOSIS — G720 Drug-induced myopathy: Secondary | ICD-10-CM | POA: Diagnosis not present

## 2022-10-12 DIAGNOSIS — R590 Localized enlarged lymph nodes: Secondary | ICD-10-CM | POA: Diagnosis not present

## 2022-10-12 DIAGNOSIS — Z1331 Encounter for screening for depression: Secondary | ICD-10-CM | POA: Diagnosis not present

## 2022-10-12 DIAGNOSIS — I712 Thoracic aortic aneurysm, without rupture, unspecified: Secondary | ICD-10-CM | POA: Diagnosis not present

## 2022-10-12 DIAGNOSIS — E785 Hyperlipidemia, unspecified: Secondary | ICD-10-CM | POA: Diagnosis not present

## 2022-10-12 DIAGNOSIS — R221 Localized swelling, mass and lump, neck: Secondary | ICD-10-CM | POA: Diagnosis not present

## 2022-10-13 DIAGNOSIS — R69 Illness, unspecified: Secondary | ICD-10-CM | POA: Diagnosis not present

## 2022-10-20 DIAGNOSIS — R69 Illness, unspecified: Secondary | ICD-10-CM | POA: Diagnosis not present

## 2022-10-25 DIAGNOSIS — R69 Illness, unspecified: Secondary | ICD-10-CM | POA: Diagnosis not present

## 2022-10-27 DIAGNOSIS — R69 Illness, unspecified: Secondary | ICD-10-CM | POA: Diagnosis not present

## 2022-11-01 DIAGNOSIS — R69 Illness, unspecified: Secondary | ICD-10-CM | POA: Diagnosis not present

## 2022-11-10 DIAGNOSIS — R69 Illness, unspecified: Secondary | ICD-10-CM | POA: Diagnosis not present

## 2022-11-22 DIAGNOSIS — R69 Illness, unspecified: Secondary | ICD-10-CM | POA: Diagnosis not present

## 2022-11-24 DIAGNOSIS — R69 Illness, unspecified: Secondary | ICD-10-CM | POA: Diagnosis not present

## 2022-11-29 DIAGNOSIS — R69 Illness, unspecified: Secondary | ICD-10-CM | POA: Diagnosis not present

## 2022-12-06 DIAGNOSIS — R69 Illness, unspecified: Secondary | ICD-10-CM | POA: Diagnosis not present

## 2022-12-08 DIAGNOSIS — R69 Illness, unspecified: Secondary | ICD-10-CM | POA: Diagnosis not present

## 2022-12-13 DIAGNOSIS — R69 Illness, unspecified: Secondary | ICD-10-CM | POA: Diagnosis not present

## 2022-12-15 DIAGNOSIS — R69 Illness, unspecified: Secondary | ICD-10-CM | POA: Diagnosis not present

## 2022-12-20 DIAGNOSIS — R69 Illness, unspecified: Secondary | ICD-10-CM | POA: Diagnosis not present

## 2022-12-22 DIAGNOSIS — R69 Illness, unspecified: Secondary | ICD-10-CM | POA: Diagnosis not present

## 2022-12-27 DIAGNOSIS — R69 Illness, unspecified: Secondary | ICD-10-CM | POA: Diagnosis not present

## 2022-12-29 DIAGNOSIS — R69 Illness, unspecified: Secondary | ICD-10-CM | POA: Diagnosis not present

## 2023-01-03 DIAGNOSIS — R69 Illness, unspecified: Secondary | ICD-10-CM | POA: Diagnosis not present

## 2023-01-05 DIAGNOSIS — R69 Illness, unspecified: Secondary | ICD-10-CM | POA: Diagnosis not present

## 2023-01-06 ENCOUNTER — Other Ambulatory Visit: Payer: Self-pay | Admitting: Obstetrics and Gynecology

## 2023-01-06 DIAGNOSIS — Z0000 Encounter for general adult medical examination without abnormal findings: Secondary | ICD-10-CM

## 2023-01-19 DIAGNOSIS — H6123 Impacted cerumen, bilateral: Secondary | ICD-10-CM | POA: Diagnosis not present

## 2023-02-20 ENCOUNTER — Ambulatory Visit: Admission: RE | Admit: 2023-02-20 | Payer: Medicare HMO | Source: Ambulatory Visit

## 2023-02-20 DIAGNOSIS — Z1231 Encounter for screening mammogram for malignant neoplasm of breast: Secondary | ICD-10-CM | POA: Diagnosis not present

## 2023-02-20 DIAGNOSIS — Z0000 Encounter for general adult medical examination without abnormal findings: Secondary | ICD-10-CM

## 2023-04-03 DIAGNOSIS — M21611 Bunion of right foot: Secondary | ICD-10-CM | POA: Diagnosis not present

## 2023-04-03 DIAGNOSIS — M2041 Other hammer toe(s) (acquired), right foot: Secondary | ICD-10-CM | POA: Diagnosis not present

## 2023-04-03 DIAGNOSIS — M7741 Metatarsalgia, right foot: Secondary | ICD-10-CM | POA: Diagnosis not present

## 2023-04-19 DIAGNOSIS — L820 Inflamed seborrheic keratosis: Secondary | ICD-10-CM | POA: Diagnosis not present

## 2023-05-22 ENCOUNTER — Other Ambulatory Visit: Payer: Self-pay | Admitting: Internal Medicine

## 2023-05-24 ENCOUNTER — Other Ambulatory Visit: Payer: Self-pay

## 2023-05-24 ENCOUNTER — Encounter: Payer: Self-pay | Admitting: Internal Medicine

## 2023-05-24 MED ORDER — NEXLIZET 180-10 MG PO TABS: 1.0000 | ORAL_TABLET | Freq: Every day | ORAL | 1 refills | Status: DC

## 2023-06-12 DIAGNOSIS — M79671 Pain in right foot: Secondary | ICD-10-CM | POA: Diagnosis not present

## 2023-06-12 DIAGNOSIS — M79672 Pain in left foot: Secondary | ICD-10-CM | POA: Diagnosis not present

## 2023-06-18 NOTE — Progress Notes (Unsigned)
Cardiology Office Note   Date:  06/19/2023   ID:  [name], [name] 07/20/1951, MRN [id number]  PCP:  Alysia Penna, MD  Cardiologist:   Dietrich Pates, MD    Pt presents for f/u of CAD and HL    History of Present Illness: Cynthia Padilla is a 72 y.o. female with a history of chest pain   I saw her in 2017  Pain was atypical for coronary ischemia    Given strong FHx of CAD I recomm a calcium score CT  She had this in Jan 2020 Ca score was 51.9   The aorta was noted to be 41 mm  Atherosclerosis of aorta noted  In 2021 CTA of aorta was done  Aorta measured 3.8 cm    Not dilated   Since I saw her in 2023 the pt has done well  Denies CP  Breathing is good    She is active   Denies palpitations    No dizziness      Current Outpatient Medications  Medication Sig Dispense Refill   acetaminophen (TYLENOL) 500 MG tablet Take 1,000 mg by mouth daily as needed for headache or moderate pain.     albuterol (VENTOLIN HFA) 108 (90 Base) MCG/ACT inhaler SMARTSIG:1-2 Puff(s) Via Inhaler Every 4-6 Hours PRN     budesonide-formoterol (SYMBICORT) 80-4.5 MCG/ACT inhaler 1 puff as needed (allergies).     Coenzyme Q10 (COQ10) 150 MG CAPS Take 1 capsule by mouth as needed.     meloxicam (MOBIC) 15 MG tablet Take 15 mg by mouth as needed.     Probiotic Product (CVS PROBIOTIC) CAPS SMARTSIG:1 Capsule(s) By Mouth     Bempedoic Acid-Ezetimibe (NEXLIZET) 180-10 MG TABS Take 1 tablet by mouth daily. 90 tablet 3   hydrochlorothiazide (HYDRODIURIL) 25 MG tablet Take 1 tablet (25 mg total) by mouth every evening. 90 tablet 3   No current facility-administered medications for this visit.    Allergies:   Atorvastatin, Fluconazole, Livalo [pitavastatin], Praluent [alirocumab], Pravastatin, Rosuvastatin, and Rosuvastatin calcium   Past Medical History:  Diagnosis Date   Anxiety    Arthritis    thinks she has some in her spine, bursitis in hips   Asthma    had it once when she had episode of  bronchitis about 10 yrs ago   Diverticulitis    DIVERTICULOSIS, COLON 11/04/2004   Qualifier: Diagnosis of  By: Vear Clock CMA Duncan Dull), Judeth Cornfield     GERD (gastroesophageal reflux disease)    Hepatitis    thinks she had Hepatits as a child   History of colonic polyps    Hyperlipidemia    Hypertension    Internal hemorrhoids    PONV (postoperative nausea and vomiting)    states she gets deathly sick   Vertigo     Past Surgical History:  Procedure Laterality Date   BREAST BIOPSY Right    ELBOW FRACTURE SURGERY Left    HERNIA REPAIR     pt denies   HIP SURGERY     L hip total, May 2023   LAMINECTOMY  10/11/2011   with fusion @ L4-5   RETINAL TEAR REPAIR CRYOTHERAPY Right 2015   TUBAL LIGATION       Social History:  The patient  reports that she has never smoked. She has never used smokeless tobacco. She reports current alcohol use of about 3.0 standard drinks of alcohol per week. She reports that she does not use drugs.   Family History:  The  patient's family history includes Colon cancer in her sister; Diabetes in her brother, father, and sister; Heart disease in her father; Leukemia in her father; Lung cancer in her mother; Ovarian cancer in her mother; Rectal cancer in her sister.    ROS:  Please see the history of present illness. All other systems are reviewed and  Negative to the above problem except as noted.    PHYSICAL EXAM: VS:  BP 130/76   Pulse (!) 59   Ht 5\' 7"  (1.702 m)   Wt 188 lb 12.8 oz (85.6 kg)   SpO2 98%   BMI 29.57 kg/m   GEN: Well nourished, well developed, in no acute distress  HEENT: normal  Neck: JVP is normal  No bruit Cardiac: RRR; no murmur  No LE  edema  Respiratory:  clear to auscultation  GI: soft, nontender  No hepatomegaly   EKG:  EKG shows SB 59 bpm      Lipid Panel    Component Value Date/Time   CHOL 143 05/25/2022 0942   TRIG 77 05/25/2022 0942   HDL 56 05/25/2022 0942   CHOLHDL 2.6 05/25/2022 0942   CHOLHDL 3 01/29/2014 0813    VLDL 20.2 01/29/2014 0813   LDLCALC 72 05/25/2022 0942   LDLDIRECT 125.6 03/28/2012 0922      Wt Readings from Last 3 Encounters:  06/19/23 188 lb 12.8 oz (85.6 kg)  09/20/22 173 lb (78.5 kg)  08/15/22 173 lb (78.5 kg)      ASSESSMENT AND PLAN:  1  CAD  Pt with mild plaqing on CT scan  Asymptomatic   Follow    2  HTN  BP is well controlled   3  HL  Doing well on Nexliizet   LDL is 73  HDL 51   Follow     Follow up in 1 year    Stay active     Watch diet       Signed, Dietrich Pates, MD  06/19/2023 4:21 PM    [facility] [address] [place], [place], [place]  [address] Phone: [phone]; Fax: [phone]

## 2023-06-19 ENCOUNTER — Ambulatory Visit: Payer: Medicare HMO | Attending: Internal Medicine | Admitting: Internal Medicine

## 2023-06-19 ENCOUNTER — Encounter: Payer: Self-pay | Admitting: Internal Medicine

## 2023-06-19 VITALS — BP 130/76 | HR 59 | Ht 67.0 in | Wt 188.8 lb

## 2023-06-19 DIAGNOSIS — I10 Essential (primary) hypertension: Secondary | ICD-10-CM | POA: Diagnosis not present

## 2023-06-19 MED ORDER — NEXLIZET 180-10 MG PO TABS: 1.0000 | ORAL_TABLET | Freq: Every day | ORAL | 3 refills | Status: AC

## 2023-06-19 MED ORDER — HYDROCHLOROTHIAZIDE 25 MG PO TABS: 25.0000 mg | ORAL_TABLET | Freq: Every evening | ORAL | 3 refills | Status: DC

## 2023-06-19 NOTE — Patient Instructions (Signed)
Medication Instructions:   *If you need a refill on your cardiac medications before your next appointment, please call your pharmacy*   Lab Work:  If you have labs (blood work) drawn today and your tests are completely normal, you will receive your results only by: MyChart Message (if you have MyChart) OR A paper copy in the mail If you have any lab test that is abnormal or we need to change your treatment, we will call you to review the results.   Testing/Procedures:    Follow-Up: At [facility], you and your health needs are our priority.  As part of our continuing mission to provide you with exceptional heart care, we have created designated Provider Care Teams.  These Care Teams include your primary Cardiologist (physician) and Advanced Practice Providers (APPs -  Physician Assistants and Nurse Practitioners) who all work together to provide you with the care you need, when you need it.  We recommend signing up for the patient portal called "MyChart".  Sign up information is provided on this After Visit Summary.  MyChart is used to connect with patients for Virtual Visits (Telemedicine).  Patients are able to view lab/test results, encounter notes, upcoming appointments, etc.  Non-urgent messages can be sent to your provider as well.   To learn more about what you can do with MyChart, go to [email or web address].    Your next appointment:   1 year(s)  Provider:   Paula Ross, MD     Other Instructions   

## 2023-06-20 ENCOUNTER — Ambulatory Visit: Payer: Medicare HMO | Admitting: Internal Medicine

## 2023-06-21 ENCOUNTER — Ambulatory Visit: Payer: Medicare HMO | Admitting: Internal Medicine

## 2023-06-21 ENCOUNTER — Encounter: Payer: Self-pay | Admitting: Internal Medicine

## 2023-06-21 ENCOUNTER — Ambulatory Visit: Payer: Medicare HMO

## 2023-06-21 DIAGNOSIS — R918 Other nonspecific abnormal finding of lung field: Secondary | ICD-10-CM | POA: Diagnosis not present

## 2023-06-21 DIAGNOSIS — Z23 Encounter for immunization: Secondary | ICD-10-CM

## 2023-06-21 DIAGNOSIS — J4540 Moderate persistent asthma, uncomplicated: Secondary | ICD-10-CM

## 2023-06-21 LAB — NITRIC OXIDE: Nitric Oxide: 61

## 2023-06-21 NOTE — Progress Notes (Signed)
OV 05/20/2020  Subjective:  Patient ID: Cynthia Padilla, female , DOB: [birth year] , age 72 y.o. , MRN: [id number] , ADDRESS: [address] [place] [address] PCP Cynthia Penna, MD Patient Care Team: Cynthia Penna, MD as PCP - General (Internal Medicine) Cynthia Riffle, MD as PCP - Cardiology (Cardiology)  This Provider for this visit: Treatment Team:  Attending Provider: Kalman Shan, MD    05/20/2020 -   Chief Complaint  Patient presents with   Consult    Pt is here due to a persistent cough she has had since 8 months. Pt also has had an occ sore throat and sinus problems also with wheezing which has subsided.     HPI Cynthia Padilla 72 y.o. -former Psychologist, prison and probation services at [employer].  She is the wife of my patient Cynthia Padilla who suffers from asthma and who is on interleukin-5 receptor blockade.  I have known Cynthia Padilla through her visits accompanying her husband.  In this particular visit she presents as the patient herself.  She tells me that she was at baseline health up until March 2021.  She started having dry cough.  Along with this during acceleration there was some wheezing.  She saw primary care physician who gave her prednisone Breo and Tessalon Perles.  Then within a few weeks things resolved.  Then she came off Breo.  I believe the prednisone initially was for 10 days.  Then again in May same thing happened.  She took another prednisone for 10 days.  This time took Symbicort 1 puff twice daily along with albuterol.  There was some rattling and mucus.  Apparently chest x-ray was negative.  She did go to an urgent care when the chest x-ray was done.  She was given Decadron.  After that she got better and then was doing completely well till September 2021.  In the gap between May 2021 and September 2021 she is not taking any Symbicort or any inhaled steroid.  Then in September 2021 the same thing happen and around mid September she took prednisone for  10 days.  Then by end of September same thing recurred.  This time was a little bit different started as a sore throat and then went up the sinuses Covid testing x2 was negative.  She says she has some cough wheezing nocturnal symptoms cough is mostly dry but sometimes she has mucus production.  This time she is not taking prednisone but she is doing Symbicort.  She feels after a few weeks things are improving.  At baseline she has hypertension she is not on ACE inhibitor she is on hydrochlorothiazide.  She has acid reflux but not symptomatic anymore.  In the past she took PPI for many years and then lost weight and followed a healthy diet and got rid of acid reflux.  Her last prednisone was in September 2021.  Of note she is concerned about the discoloration in her tongue.  She describes this is black coating.  On my exam it was gray-green.  She admitted to using cough lozenges of a similar color for the last 2 weeks and the discoloration present only for the last 1 week.  Her RSI cough score as recently experienced is as below.     Of note her last lung imaging was in January 2021 when she had CT angiogram chest for slightly enlarged aorta.  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  She  follows with Cynthia Padilla for this.  On the lung images the mediastinum was felt to be normal and the pulmonary parenchyma is felt to be normal.  She does have a 5 mm peripheral right upper lobe nodule.  It appears that Cynthia Padilla reviewed the CT scan along with old ones from a previous health system.  And she has had these nodules for some years and it is stable.  [name]  She has not been a smoker.   Other issue is that she is not vaccinated herself against COVID.  She normally gets vaccines.  But apparently a few years ago teach took the shingles vaccine [it was not Shingrix but the zoster] and she had right arm swelling.  Therefore she  is can of petrified.  But recently she not had issues with flu shot or other vaccines in terms of major allergic reactions.  We discussed the overall incidence of anaphylaxis and that the risk is being 2% but the risk is lower in patientts in  patients were generated to have tolerated vaccines without any allergic reaction.  We discussed the 3 options.  Recommended anecdotally Pfizer over the other 2.  ROS - per HPI  Results for Cynthia Padilla (MRN [id number]) as of 05/20/2020 15:55  Ref. Range 07/19/2011 08:32 03/28/2012 09:22 03/27/2013 08:47 01/29/2014 08:13 03/06/2014 12:13  Eosinophils Absolute Latest Ref Range: 0.0 - 0.7 K/uL 0.2 0.1 0.1 0.1 0.1   CT angiogram chest August 19, 2019 -.IMPRESSION: 1. No evidence for thoracic aortic aneurysm on this contrast enhanced study. The ascending thoracic aorta measures approximately 3.8 cm on this examination. 2. There is a 5 mm pulmonary nodule in the peripheral right upper lobe. No follow-up needed if patient is low-risk. Non-contrast chest CT can be considered in 12 months if patient is high-risk. This recommendation follows the consensus statement: Guidelines for Management of Incidental Pulmonary Nodules Detected on CT Images: From the Fleischner Society 2017; Radiology 2017; 284:228-243.     Electronically Signed   By: Cynthia Padilla M.D.   On: 08/19/2019 16:33   No results found for: NITRICOXIDE    OV 09/09/2020  Subjective:  Patient ID: Cynthia Padilla, female , DOB: 1950/11/07 , age 77 y.o. , MRN: [id number] , ADDRESS: [address] [place] [address] PCP Cynthia Penna, MD Patient Care Team: Cynthia Penna, MD as PCP - General (Internal Medicine) Cynthia Riffle, MD as PCP - Cardiology (Cardiology)  This Provider for this visit: Treatment Team:  Attending Provider: Kalman Shan, MD    09/09/2020 -   Chief Complaint  Patient presents with   Follow-up    Doing well     HPI Cynthia Padilla 72 y.o.  -presents for asthma follow-up.  She continues to do well.  The Symbicort is really helping her but causes hoarseness of voice.  Therefore it is a difficult balance.  She is found that if she takes Symbicort at night once daily then it is fine.  Nevertheless the last few days she has some hoarseness which she believes is from Symbicort.  There is no respiratory illness.  We offered the option of switching to Pulmicort nebulizer but at this point she just wants to continue with Symbicort at night.  In terms of COVID-19: She is not yet vaccinated.  She is fearful of the side effects.  Because of this she is unable to travel to [place].  She checked an IgG  after a respiratory infection in the fall with IgG was negative.  Therefore she had to cancel her [place] trip.  She is also not able to travel to [place] because of all these restrictions.  She has asthma and age 30.  She has some risk factors for getting serious Covid.  Therefore I discussed the option about referring for monoclonal antibody Evusheld but at this point in time she wants to hold off.  She wants to talk to her travel operator [employer] about making a decision.  She is hopeful that policy restrictions on travel will lift off.  We did did discuss the option about antivirals if she were to get Covid.  She will continue to stay socially distance and mask and take appropriate precautions.      OV 06/21/2023  Subjective:  Patient ID: Cynthia Padilla, female , DOB: [birth year] , age 51 y.o. , MRN: [id number] , ADDRESS: [address] [place] [address] PCP Cynthia Penna, MD Patient Care Team: Cynthia Penna, MD as PCP - General (Internal Medicine) Cynthia Riffle, MD as PCP - Cardiology (Cardiology) Hilarie Fredrickson, MD as Consulting Physician (Gastroenterology)  This Provider for this visit: Treatment Team:  Attending Provider: Kalman Shan, MD    06/21/2023 -   Chief Complaint  Patient presents with   Follow-up     Breathing has overall been doing well. She has been wheezing some. Has been taking symbicort more frequently. She has had some PND and cough with yellow sputum.      HPI [place] 72 y.o. -returns for follow-up of her asthma.  Have not actually seen her more than 2 and half years although she is accompanied her husband who has eosinophilic asthma on visits.  Her husband himself in October 2024 had hip replacement and is doing well.  They both are looking at changing the insurance from [place] to [place] [employer] because [place] [employer] has better coverage with [employer].  She is currently using her Symbicort 1 puff twice daily.  She reported asthma control is good ACT score is 24.  However she does admit that occasionally she will have a wheeze.  Is not to the point that she is waking up at night it is not to the point that she is having to use albuterol for rescue.  But when she takes a deep breath and exhale she will hear the wheeze.  Infectious able to reproduce that in the office.  In auscultation there was no wheeze.  Her exhaled nitric oxide though today was elevated at 61 ppb suggesting suboptimal control of asthma.  She is agreed to have a flu shot today.     Asthma Control Test ACT Total Score  06/21/2023  3:09 PM 24      Lab Results  Component Value Date   NITRICOXIDE 61 06/21/2023        PFT    Latest Ref Rng & Units 07/13/2020   10:58 AM  PFT Results  FVC-Pre L 3.47   FVC-Predicted Pre % 104   FVC-Post L 3.47   FVC-Predicted Post % 103   Pre FEV1/FVC % % 77   Post FEV1/FCV % % 82   FEV1-Pre L 2.66   FEV1-Predicted Pre % 105   FEV1-Post L 2.86   DLCO uncorrected ml/min/mmHg 26.37   DLCO UNC% % 123   DLCO corrected ml/min/mmHg 26.37   DLCO COR %Predicted % 123   DLVA Predicted % 119   TLC L 6.11  TLC % Predicted % 111   RV % Predicted % 116        LAB RESULTS last 96 hours No results found.  LAB RESULTS last 90 days Recent Results (from the  past 2160 hour(s))  Nitric oxide     Status: None   Collection Time: 06/21/23  3:45 PM  Result Value Ref Range   Nitric Oxide 61          has a past medical history of Anxiety, Arthritis, Asthma, Diverticulitis, DIVERTICULOSIS, COLON (11/04/2004), GERD (gastroesophageal reflux disease), Hepatitis, History of colonic polyps, Hyperlipidemia, Hypertension, Internal hemorrhoids, PONV (postoperative nausea and vomiting), and Vertigo.   reports that she has never smoked. She has never used smokeless tobacco.  Past Surgical History:  Procedure Laterality Date   BREAST BIOPSY Right    ELBOW FRACTURE SURGERY Left    HERNIA REPAIR     pt denies   HIP SURGERY     L hip total, May 2023   LAMINECTOMY  10/11/2011   with fusion @ L4-5   RETINAL TEAR REPAIR CRYOTHERAPY Right 2015   TUBAL LIGATION      Allergies  Allergen Reactions   Atorvastatin Other (See Comments)    Myalgias on 10mg  daily dosing   Fluconazole Other (See Comments)   Livalo [Pitavastatin]     Myalgias on 1mg  daily dosing   Praluent [Alirocumab]     Elbow pain on single injection of 75mg    Pravastatin     Myalgias on 20mg  daily and every other day   Rosuvastatin     Myalgias on 2.5mg  and 5mg  dose   Rosuvastatin Calcium     Immunization History  Administered Date(s) Administered   Fluad Trivalent(High Dose 65+) 06/21/2023   Influenza Split 05/13/2013   Influenza, High Dose Seasonal PF 06/07/2017   Influenza-Unspecified 06/08/2014, 05/11/2015, 05/08/2016, 07/08/2017, 08/08/2018   Pneumococcal Conjugate-13 04/27/2015   Pneumococcal Polysaccharide-23 08/08/2010   Pneumococcal-Unspecified 05/11/2015   Td 01/07/2003   Tdap 02/04/2014   Zoster, Live 03/30/2010, 08/08/2010    Family History  Problem Relation Age of Onset   Ovarian cancer Mother    Lung cancer Mother    Diabetes Father    Heart disease Father    Leukemia Father    Colon cancer Sister    Diabetes Sister        x 2   Rectal cancer Sister         anal   Diabetes Brother        x 3   Throat cancer Neg Hx    Kidney disease Neg Hx    Anesthesia problems Neg Hx    Liver disease Neg Hx    Breast cancer Neg Hx    Colon polyps Neg Hx    Esophageal cancer Neg Hx    Stomach cancer Neg Hx      Current Outpatient Medications:    acetaminophen (TYLENOL) 500 MG tablet, Take 1,000 mg by mouth daily as needed for headache or moderate pain., Disp: , Rfl:    albuterol (VENTOLIN HFA) 108 (90 Base) MCG/ACT inhaler, SMARTSIG:1-2 Puff(s) Via Inhaler Every 4-6 Hours PRN, Disp: , Rfl:    Bempedoic Acid-Ezetimibe (NEXLIZET) 180-10 MG TABS, Take 1 tablet by mouth daily., Disp: 90 tablet, Rfl: 3   budesonide-formoterol (SYMBICORT) 80-4.5 MCG/ACT inhaler, 1 puff as needed (allergies)., Disp: , Rfl:    Coenzyme Q10 (COQ10) 150 MG CAPS, Take 1 capsule by mouth as needed., Disp: , Rfl:    hydrochlorothiazide (HYDRODIURIL) 25  MG tablet, Take 1 tablet (25 mg total) by mouth every evening., Disp: 90 tablet, Rfl: 3   meloxicam (MOBIC) 15 MG tablet, Take 15 mg by mouth as needed., Disp: , Rfl:    Probiotic Product ([employer] PROBIOTIC) CAPS, SMARTSIG:1 Capsule(s) By Mouth, Disp: , Rfl:       Objective:   Vitals:   06/21/23 1511  BP: 118/74  Pulse: 61  Temp: 98.7 F (37.1 C)  TempSrc: Oral  SpO2: 99%  Weight: 193 lb (87.5 kg)  Height: 5\' 7"  (1.702 m)    Estimated body mass index is 30.23 kg/m as calculated from the following:   Height as of this encounter: 5\' 7"  (1.702 m).   Weight as of this encounter: 193 lb (87.5 kg).  @WEIGHTCHANGE @  Filed Weights   06/21/23 1511  Weight: 193 lb (87.5 kg)     Physical Exam   General: No distress. Looks well O2 at rest: no Cane present: no Sitting in wheel chair: no Frail: no Obese: no Neuro: Alert and Oriented x 3. GCS 15. Speech normal Psych: Pleasant Resp:  Barrel Chest - no.  Wheeze - no, Crackles - no, No overt respiratory distress CVS: Normal heart sounds. Murmurs - no Ext: Stigmata of  Connective Tissue Disease - no HEENT: Normal upper airway. PEERL +. No post nasal drip        Assessment:       ICD-10-CM   1. Moderate persistent asthma without complication  J45.40 Nitric oxide    CBC w/Diff    IgE    DG Chest 2 View    CBC w/Diff    IgE    2. Flu vaccine need  Z23 Flu Vaccine Trivalent High Dose (Fluad)         Plan:     Patient Instructions     ICD-10-CM   1. Moderate persistent asthma without complication  J45.40     2. Flu vaccine need  Z23         Cough/Wheezing/Clinical asthma  - glad symbicort is helping and you are able to control it with Symbicort once daily  -Recognized that  some mild intermittent residua wheezing is present - feno is 61 ppb and sugges that asthma might not be fully under control. So, we will do some workup to see how we can advocate for better control  Plan - Continue Symbicort one puff twice daily  - for now but based on results of testing might have to adjust - check CXR 2 view - check cbc with diff and blood IgE   5mm Nodule RUL - seen in CT angio jan 2021 - > no change oct 2023    Plan - no followup for nodule  Flu vaccine, need  Plan  - high dose flu shot 06/21/2023   Followup - text me early next week for results  - 9 months or sooner if needed   FOLLOWUP Return in about 9 months (around 03/20/2024) for 15 min visit, with Dr Marchelle Gearing, Asthma.    SIGNATURE    Dr. Kalman Padilla, M.D., F.C.C.P,  Pulmonary and Critical Care Medicine Staff Physician, [facility] Center Director - Interstitial Lung Disease  Program  Pulmonary Fibrosis [facility] [place], [place], [address]  Pager: [phone], If no answer or between  15:00h - 7:00h: call [phone] Telephone: [phone]  5:14 PM 06/21/2023

## 2023-06-21 NOTE — Patient Instructions (Addendum)
ICD-10-CM   1. Moderate persistent asthma without complication  J45.40     2. Flu vaccine need  Z23         Cough/Wheezing/Clinical asthma  - glad symbicort is helping and you are able to control it with Symbicort once daily  -Recognized that  some mild intermittent residua wheezing is present - feno is 61 ppb and sugges that asthma might not be fully under control. So, we will do some workup to see how we can advocate for better control  Plan - Continue Symbicort one puff twice daily  - for now but based on results of testing might have to adjust - check CXR 2 view - check cbc with diff and blood IgE   5mm Nodule RUL - seen in CT angio jan 2021 - > no change oct 2023    Plan - no followup for nodule  Flu vaccine, need  Plan  - high dose flu shot 06/21/2023   Followup - text me early next week for results  - 9 months or sooner if needed

## 2023-06-22 ENCOUNTER — Telehealth: Payer: Self-pay | Admitting: Internal Medicine

## 2023-06-22 DIAGNOSIS — J4540 Moderate persistent asthma, uncomplicated: Secondary | ICD-10-CM

## 2023-06-22 LAB — CBC WITH DIFFERENTIAL/PLATELET
Basophils Absolute: 0.1 10*3/uL (ref 0.0–0.1)
Basophils Relative: 0.7 % (ref 0.0–3.0)
Eosinophils Absolute: 0.6 10*3/uL (ref 0.0–0.7)
Eosinophils Relative: 6.1 % — ABNORMAL HIGH (ref 0.0–5.0)
HCT: 42 % (ref 36.0–46.0)
Hemoglobin: 13.8 g/dL (ref 12.0–15.0)
Lymphocytes Relative: 38.3 % (ref 12.0–46.0)
Lymphs Abs: 3.6 10*3/uL (ref 0.7–4.0)
MCHC: 32.9 g/dL (ref 30.0–36.0)
MCV: 94.7 fL (ref 78.0–100.0)
Monocytes Absolute: 0.7 10*3/uL (ref 0.1–1.0)
Monocytes Relative: 7.1 % (ref 3.0–12.0)
Neutro Abs: 4.5 10*3/uL (ref 1.4–7.7)
Neutrophils Relative %: 47.8 % (ref 43.0–77.0)
Platelets: 324 10*3/uL (ref 150.0–400.0)
RBC: 4.44 Mil/uL (ref 3.87–5.11)
RDW: 12.1 % (ref 11.5–15.5)
WBC: 9.5 10*3/uL (ref 4.0–10.5)

## 2023-06-22 LAB — IGE: IgE (Immunoglobulin E), Serum: 187 kU/L — ABNORMAL HIGH (ref <=114)

## 2023-06-22 NOTE — Telephone Encounter (Addendum)
Cxr clear Blood eos high 600cells Feno high IgE high but in 2021 was normal  All suggestive of asthma not being well controlled an dchange in allergy phenotype  Plan I have communicated with the patient directly.  Asked her to double up on the Symbicort to baseline dose of 2 puffs 2 times daily - Please order RAST allergy panel -She has not made a follow-up to see me within 3 months.  She should keep that.-    Recent Labs  Lab 06/21/23 1558  HGB 13.8  HCT 42.0  WBC 9.5  PLT 324.0     DG Chest 2 View  Result Date: 06/21/2023 CLINICAL DATA:  Cough wheezing asthma EXAM: CHEST - 2 VIEW COMPARISON:  CT 06/02/2022 FINDINGS: Central airways thickening. No consolidation or pleural effusion. Normal cardiac size. No pneumothorax IMPRESSION: Central airways thickening likely corresponds to history of asthma. No focal pneumonia. Electronically Signed   By: [name] M.D.   On: 06/21/2023 17:41

## 2023-06-23 NOTE — Telephone Encounter (Signed)
Rast panel ordered.

## 2023-06-28 ENCOUNTER — Encounter: Payer: Self-pay | Admitting: Internal Medicine

## 2023-06-30 DIAGNOSIS — Z6828 Body mass index (BMI) 28.0-28.9, adult: Secondary | ICD-10-CM | POA: Diagnosis not present

## 2023-06-30 DIAGNOSIS — M5416 Radiculopathy, lumbar region: Secondary | ICD-10-CM | POA: Diagnosis not present

## 2023-07-03 ENCOUNTER — Other Ambulatory Visit: Payer: Self-pay | Admitting: Neurological Surgery

## 2023-07-03 ENCOUNTER — Other Ambulatory Visit: Payer: Medicare HMO

## 2023-07-03 DIAGNOSIS — M5416 Radiculopathy, lumbar region: Secondary | ICD-10-CM

## 2023-07-03 DIAGNOSIS — J4540 Moderate persistent asthma, uncomplicated: Secondary | ICD-10-CM | POA: Diagnosis not present

## 2023-07-06 LAB — ALLERGEN PROFILE, PERENNIAL ALLERGEN IGE

## 2023-07-14 ENCOUNTER — Encounter: Payer: Self-pay | Admitting: Neurological Surgery

## 2023-07-20 DIAGNOSIS — H938X3 Other specified disorders of ear, bilateral: Secondary | ICD-10-CM | POA: Diagnosis not present

## 2023-07-21 ENCOUNTER — Encounter: Payer: Self-pay | Admitting: Internal Medicine

## 2023-07-21 ENCOUNTER — Ambulatory Visit
Admission: RE | Admit: 2023-07-21 | Discharge: 2023-07-21 | Disposition: A | Discharge status: Home / Self Care | Payer: Medicare HMO | Source: Ambulatory Visit | Attending: Neurological Surgery | Admitting: Neurological Surgery

## 2023-07-21 DIAGNOSIS — M5416 Radiculopathy, lumbar region: Secondary | ICD-10-CM

## 2023-07-21 DIAGNOSIS — M47816 Spondylosis without myelopathy or radiculopathy, lumbar region: Secondary | ICD-10-CM | POA: Diagnosis not present

## 2023-07-21 DIAGNOSIS — Z981 Arthrodesis status: Secondary | ICD-10-CM | POA: Diagnosis not present

## 2023-07-26 DIAGNOSIS — G629 Polyneuropathy, unspecified: Secondary | ICD-10-CM | POA: Diagnosis not present

## 2023-09-18 ENCOUNTER — Encounter: Payer: Self-pay | Admitting: Internal Medicine

## 2023-09-18 ENCOUNTER — Ambulatory Visit: Payer: Medicare Other | Admitting: Internal Medicine

## 2023-09-18 VITALS — BP 130/78 | HR 70 | Temp 98.1°F | Ht 67.0 in | Wt 194.8 lb

## 2023-09-18 DIAGNOSIS — J4540 Moderate persistent asthma, uncomplicated: Secondary | ICD-10-CM

## 2023-09-18 NOTE — Progress Notes (Signed)
 OV 05/20/2020  Subjective:  Patient ID: Cynthia Padilla, female , DOB: [birth year] , age 73 y.o. , MRN: [id number] , ADDRESS: [address] [place] [address] PCP Barnetta Liberty, MD Patient Care Team: Barnetta Liberty, MD as PCP - General (Internal Medicine) Elmyra Haggard, MD as PCP - Cardiology (Cardiology)  This Provider for this visit: Treatment Team:  Attending Provider: Maire Scot, MD    05/20/2020 -   Chief Complaint  Patient presents with   Consult    Pt is here due to a persistent cough she has had since 8 months. Pt also has had an occ sore throat and sinus problems also with wheezing which has subsided.     HPI Cynthia Padilla 73 y.o. -former Psychologist, prison and probation services at [employer].  She is the wife of my patient Cynthia Padilla who suffers from asthma and who is on interleukin-5 receptor blockade.  I have known [name] through her visits accompanying her husband.  In this particular visit she presents as the patient herself.  She tells me that she was at baseline health up until March 2021.  She started having dry cough.  Along with this during acceleration there was some wheezing.  She saw primary care physician who gave her prednisone  Breo and [employer].  Then within a few weeks things resolved.  Then she came off Breo.  I believe the prednisone  initially was for 10 days.  Then again in May same thing happened.  She took another prednisone  for 10 days.  This time took Symbicort 1 puff twice daily along with albuterol.  There was some rattling and mucus.  Apparently chest x-ray was negative.  She did go to an urgent care when the chest x-ray was done.  She was given Decadron .  After that she got better and then was doing completely well till September 2021.  In the gap between May 2021 and September 2021 she is not taking any Symbicort or any inhaled steroid.  Then in September 2021 the same thing happen and around mid September she took prednisone  for  10 days.  Then by end of September same thing recurred.  This time was a little bit different started as a sore throat and then went up the sinuses Covid testing x2 was negative.  She says she has some cough wheezing nocturnal symptoms cough is mostly dry but sometimes she has mucus production.  This time she is not taking prednisone  but she is doing Symbicort.  She feels after a few weeks things are improving.  At baseline she has hypertension she is not on ACE inhibitor she is on hydrochlorothiazide .  She has acid reflux but not symptomatic anymore.  In the past she took PPI for many years and then lost weight and followed a healthy diet and got rid of acid reflux.  Her last prednisone  was in September 2021.  Of note she is concerned about the discoloration in her tongue.  She describes this is black coating.  On my exam it was gray-green.  She admitted to using cough lozenges of a similar color for the last 2 weeks and the discoloration present only for the last 1 week.  Her RSI cough score as recently experienced is as below.     Of note her last lung imaging was in January 2021 when she had CT angiogram chest for slightly enlarged aorta.  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  She  follows with Dr. Ola Berger for this.  On the lung images the mediastinum was felt to be normal and the pulmonary parenchyma is felt to be normal.  She does have a 5 mm peripheral right upper lobe nodule.  It appears that Dr. Avanell Bob reviewed the CT scan along with old ones from a previous health system.  And she has had these nodules for some years and it is stable.  [name]  She has not been a smoker.   Other issue is that she is not vaccinated herself against COVID.  She normally gets vaccines.  But apparently a few years ago teach took the shingles vaccine [it was not Shingrix but the zoster] and she had right arm swelling.  Therefore she  is can of Cynthia Padilla.  But recently she not had issues with flu shot or other vaccines in terms of major allergic reactions.  We discussed the overall incidence of anaphylaxis and that the risk is being 2% but the risk is lower in patientts in  patients were generated to have tolerated vaccines without any allergic reaction.  We discussed the 3 options.  Recommended anecdotally Pfizer over the other 2.  ROS - per HPI  Results for Padilla, Cynthia (MRN [id number]) as of 05/20/2020 15:55  Ref. Range 07/19/2011 08:32 03/28/2012 09:22 03/27/2013 08:47 01/29/2014 08:13 03/06/2014 12:13  Eosinophils Absolute Latest Ref Range: 0.0 - 0.7 K/uL 0.2 0.1 0.1 0.1 0.1   CT angiogram chest August 19, 2019 -.IMPRESSION: 1. No evidence for thoracic aortic aneurysm on this contrast enhanced study. The ascending thoracic aorta measures approximately 3.8 cm on this examination. 2. There is a 5 mm pulmonary nodule in the peripheral right upper lobe. No follow-up needed if patient is low-risk. Non-contrast chest CT can be considered in 12 months if patient is high-risk. This recommendation follows the consensus statement: Guidelines for Management of Incidental Pulmonary Nodules Detected on CT Images: From the Fleischner Society 2017; Radiology 2017; 284:228-243.     Electronically Signed   By: Laird Pih M.D.   On: 08/19/2019 16:33   No results found for: NITRICOXIDE    OV 09/09/2020  Subjective:  Patient ID: Cynthia Padilla, female , DOB: 1951-01-31 , age 4 y.o. , MRN: [id number] , ADDRESS: [address] [place] [address] PCP Barnetta Liberty, MD Patient Care Team: Barnetta Liberty, MD as PCP - General (Internal Medicine) Elmyra Haggard, MD as PCP - Cardiology (Cardiology)  This Provider for this visit: Treatment Team:  Attending Provider: Maire Scot, MD    09/09/2020 -   Chief Complaint  Patient presents with   Follow-up    Doing well     HPI [employer] 73 y.o.  -presents for asthma follow-up.  She continues to do well.  The Symbicort is really helping her but causes hoarseness of voice.  Therefore it is a difficult balance.  She is found that if she takes Symbicort at night once daily then it is fine.  Nevertheless the last few days she has some hoarseness which she believes is from Symbicort.  There is no respiratory illness.  We offered the option of switching to Pulmicort nebulizer but at this point she just wants to continue with Symbicort at night.  In terms of COVID-19: She is not yet vaccinated.  She is fearful of the side effects.  Because of this she is unable to travel to [place].  She checked an IgG  after a respiratory infection in the fall with IgG was negative.  Therefore she had to cancel her [place] trip.  She is also not able to travel to [place] because of all these restrictions.  She has asthma and age 23.  She has some risk factors for getting serious Covid.  Therefore I discussed the option about referring for monoclonal antibody Evusheld but at this point in time she wants to hold off.  She wants to talk to her travel operator [employer] about making a decision.  She is hopeful that policy restrictions on travel will lift off.  We did did discuss the option about antivirals if she were to get Covid.  She will continue to stay socially distance and mask and take appropriate precautions.      OV 06/21/2023  Subjective:  Patient ID: Cynthia Padilla, female , DOB: 01/12/1951 , age 29 y.o. , MRN: [id number] , ADDRESS: [address] [place] [address] PCP Barnetta Liberty, MD Patient Care Team: Barnetta Liberty, MD as PCP - General (Internal Medicine) Elmyra Haggard, MD as PCP - Cardiology (Cardiology) Tobin Forts, MD as Consulting Physician (Gastroenterology)  This Provider for this visit: Treatment Team:  Attending Provider: Maire Scot, MD    06/21/2023 -   Chief Complaint  Patient presents with   Follow-up     Breathing has overall been doing well. She has been wheezing some. Has been taking symbicort more frequently. She has had some PND and cough with yellow sputum.      HPI [place] 73 y.o. -returns for follow-up of her asthma.  Have not actually seen her more than 2 and half years although she is accompanied her husband who has eosinophilic asthma on visits.  Her husband himself in October 2024 had hip replacement and is doing well.  They both are looking at changing the insurance from [place] to [place] [employer] because [place] [employer] has better coverage with [employer]isa [employer].  She is currently using her Symbicort 1 puff twice daily.  She reported asthma control is good ACT score is 24.  However she does admit that occasionally she will have a wheeze.  Is not to the point that she is waking up at night it is not to the point that she is having to use albuterol for rescue.  But when she takes a deep breath and exhale she will hear the wheeze.  Infectious able to reproduce that in the office.  In auscultation there was no wheeze.  Her exhaled nitric oxide  though today was elevated at 61 ppb suggesting suboptimal control of asthma.  She is agreed to have a flu shot today.    OV 09/18/2023  Subjective:  Patient ID: Cynthia Padilla, female , DOB: 16-Mar-1951 , age 8 y.o. , MRN: [id number] , ADDRESS: [address] [place] [address] PCP Barnetta Liberty, MD Patient Care Team: Barnetta Liberty, MD as PCP - General (Internal Medicine) Elmyra Haggard, MD as PCP - Cardiology (Cardiology) Tobin Forts, MD as Consulting Physician (Gastroenterology)  This Provider for this visit: Treatment Team:  Attending Provider: Maire Scot, MD    09/18/2023 -   Chief Complaint  Patient presents with   Follow-up    Doing well.     HPI [place] 73 y.o. -follow-up asthma associated with slightly high IgE 187 and blood eosinophils 600 cells per cubic millimeter.  Last seen mid November  2024.  At that point in time she was on Symbicort 1  puff twice daily.  She thought she was doing well but her [place] was high and I had some wheezing.  Blood testing showed slightly elevated IgE and blood eosinophils but RAST panel was otherwise negative.  After that she has been on 2 puff 2 times daily and is here for follow-up.  She says she is doing well.  No albuterol rescue use.  Feels asthma is well-controlled.  She does not feel the need for additional biologic therapy or additional medications to control her asthma.     PFT     Latest Ref Rng & Units 07/13/2020   10:58 AM  ILD indicators  FVC-Pre L 3.47   FVC-Predicted Pre % 104   FVC-Post L 3.47   FVC-Predicted Post % 103   TLC L 6.11   TLC Predicted % 111   DLCO uncorrected ml/min/mmHg 26.37   DLCO UNC %Pred % 123   DLCO Corrected ml/min/mmHg 26.37   DLCO COR %Pred % 123       LAB RESULTS last 96 hours No results found.  LAB RESULTS last 90 days Recent Results (from the past 2160 hours)  Nitric oxide      Status: None   Collection Time: 06/21/23  3:45 PM  Result Value Ref Range   Nitric Oxide  61   CBC w/Diff     Status: Abnormal   Collection Time: 06/21/23  3:58 PM  Result Value Ref Range   WBC 9.5 4.0 - 10.5 K/uL   RBC 4.44 3.87 - 5.11 Mil/uL   Hemoglobin 13.8 12.0 - 15.0 g/dL   HCT 82.9 56.2 - 13.0 %   MCV 94.7 78.0 - 100.0 fl   MCHC 32.9 30.0 - 36.0 g/dL   RDW 86.5 78.4 - 69.6 %   Platelets 324.0 150.0 - 400.0 K/uL   Neutrophils Relative % 47.8 43.0 - 77.0 %   Lymphocytes Relative 38.3 12.0 - 46.0 %   Monocytes Relative 7.1 3.0 - 12.0 %   Eosinophils Relative 6.1 (H) 0.0 - 5.0 %   Basophils Relative 0.7 0.0 - 3.0 %   Neutro Abs 4.5 1.4 - 7.7 K/uL   Lymphs Abs 3.6 0.7 - 4.0 K/uL   Monocytes Absolute 0.7 0.1 - 1.0 K/uL   Eosinophils Absolute 0.6 0.0 - 0.7 K/uL   Basophils Absolute 0.1 0.0 - 0.1 K/uL  IgE     Status: Abnormal   Collection Time: 06/21/23  3:58 PM  Result Value Ref Range   IgE  (Immunoglobulin E), Serum 187 (H) <OR=114 kU/L  Perennial allergen profile IgE     Status: None   Collection Time: 07/03/23  3:36 PM  Result Value Ref Range   Class Description Allergens Comment     Comment:     Levels of Specific IgE       Class  Description of Class     ---------------------------  -----  --------------------                    < 0.10         0         Negative            0.10 -    0.31         0/I       Equivocal/Low            0.32 -    0.55         I  Low            0.56 -    1.40         II        Moderate            1.41 -    3.90         III       High            3.91 -   19.00         IV        Very High           19.01 -  100.00         V         Very High                   >100.00         VI        Very High    D Pteronyssinus IgE <0.10 Class 0 kU/L   D Farinae IgE <0.10 Class 0 kU/L   Cat Dander IgE <0.10 Class 0 kU/L   Dog Dander IgE <0.10 Class 0 kU/L   Cow Dander IgE <0.10 Class 0 kU/L   Goose Feathers IgE <0.10 Class 0 kU/L   Chicken Feathers IgE <0.10 Class 0 kU/L   Duck Feathers IgE <0.10 Class 0 kU/L   Penicillium Chrysogen IgE <0.10 Class 0 kU/L   Cladosporium Herbarum IgE <0.10 Class 0 kU/L   Aspergillus Fumigatus IgE <0.10 Class 0 kU/L   Mucor Racemosus IgE <0.10 Class 0 kU/L   Candida Albicans IgE <0.10 Class 0 kU/L   Alternaria Alternata IgE <0.10 Class 0 kU/L   Setomelanomma Rostrat <0.10 Class 0 kU/L   Aureobasidi Pullulans IgE <0.10 Class 0 kU/L   Phoma Betae IgE <0.10 Class 0 kU/L   Stemphylium Herbarum IgE <0.10 Class 0 kU/L   Mouse Urine IgE <0.10 Class 0 kU/L         has a past medical history of Anxiety, Arthritis, Asthma, Diverticulitis, DIVERTICULOSIS, COLON (11/04/2004), GERD (gastroesophageal reflux disease), Hepatitis, History of colonic polyps, Hyperlipidemia, Hypertension, Internal hemorrhoids, PONV (postoperative nausea and vomiting), and Vertigo.   reports that she has never smoked. She has never used smokeless  tobacco.  Past Surgical History:  Procedure Laterality Date   BREAST BIOPSY Right    ELBOW FRACTURE SURGERY Left    HERNIA REPAIR     pt denies   HIP SURGERY     L hip total, May 2023   LAMINECTOMY  10/11/2011   with fusion @ L4-5   RETINAL TEAR REPAIR CRYOTHERAPY Right 2015   TUBAL LIGATION      Allergies  Allergen Reactions   Atorvastatin  Other (See Comments)    Myalgias on 10mg  daily dosing   Fluconazole Other (See Comments)   Livalo  [Pitavastatin ]     Myalgias on 1mg  daily dosing   Praluent [Alirocumab]     Elbow pain on single injection of 75mg    Pravastatin      Myalgias on 20mg  daily and every other day   Rosuvastatin      Myalgias on 2.5mg  and 5mg  dose   Rosuvastatin  Calcium      Immunization History  Administered Date(s) Administered   Fluad Trivalent(High Dose 65+) 06/21/2023   Influenza Split 05/13/2013   Influenza, High Dose Seasonal PF 06/07/2017   Influenza-Unspecified 06/08/2014, 05/11/2015, 05/08/2016, 07/08/2017, 08/08/2018   Pneumococcal Conjugate-13 04/27/2015  Pneumococcal Polysaccharide-23 08/08/2010   Pneumococcal-Unspecified 05/11/2015   Td 01/07/2003   Tdap 02/04/2014   Zoster, Live 03/30/2010, 08/08/2010    Family History  Problem Relation Age of Onset   Ovarian cancer Mother    Lung cancer Mother    Diabetes Father    Heart disease Father    Leukemia Father    Colon cancer Sister    Diabetes Sister        x 2   Rectal cancer Sister        anal   Diabetes Brother        x 3   Throat cancer Neg Hx    Kidney disease Neg Hx    Anesthesia problems Neg Hx    Liver disease Neg Hx    Breast cancer Neg Hx    Colon polyps Neg Hx    Esophageal cancer Neg Hx    Stomach cancer Neg Hx      Current Outpatient Medications:    acetaminophen  (TYLENOL ) 500 MG tablet, Take 1,000 mg by mouth daily as needed for headache or moderate pain., Disp: , Rfl:    albuterol (VENTOLIN HFA) 108 (90 Base) MCG/ACT inhaler, SMARTSIG:1-2 Puff(s) Via Inhaler  Every 4-6 Hours PRN, Disp: , Rfl:    Bempedoic Acid-Ezetimibe (NEXLIZET ) 180-10 MG TABS, Take 1 tablet by mouth daily., Disp: 90 tablet, Rfl: 3   budesonide-formoterol (SYMBICORT) 80-4.5 MCG/ACT inhaler, Inhale 2 puffs into the lungs 2 (two) times daily., Disp: , Rfl:    Coenzyme Q10 (COQ10) 150 MG CAPS, Take 1 capsule by mouth as needed., Disp: , Rfl:    hydrochlorothiazide  (HYDRODIURIL ) 25 MG tablet, Take 1 tablet (25 mg total) by mouth every evening., Disp: 90 tablet, Rfl: 3   meloxicam (MOBIC) 15 MG tablet, Take 15 mg by mouth as needed., Disp: , Rfl:    Probiotic Product ([employer] PROBIOTIC) CAPS, SMARTSIG:1 Capsule(s) By Mouth, Disp: , Rfl:       Objective:   Vitals:   09/18/23 1336  BP: 130/78  Pulse: 70  Temp: 98.1 F (36.7 C)  TempSrc: Oral  SpO2: 97%  Weight: 194 lb 12.8 oz (88.4 kg)  Height: 5\' 7"  (1.702 m)    Estimated body mass index is 30.51 kg/m as calculated from the following:   Height as of this encounter: 5\' 7"  (1.702 m).   Weight as of this encounter: 194 lb 12.8 oz (88.4 kg).  @WEIGHTCHANGE @  [employer]   09/18/23 1336  Weight: 194 lb 12.8 oz (88.4 kg)     Physical Exam   General: No distress. Looks well O2 at rest: no Cane present: no Sitting in wheel chair: no Frail: n Obese: o Neuro: Alert and Oriented x 3. GCS 15. Speech normal Psych: Pleasant Resp:  Barrel Chest - no.  Wheeze - no, Crackles - no, No overt respiratory distress CVS: Normal heart sounds. Murmurs - no Ext: Stigmata of Connective Tissue Disease - non HEENT: Normal upper airway. PEERL +. No post nasal drip        Assessment:     No diagnosis found.     Plan:     Patient Instructions     ICD-10-CM   1. Moderate persistent asthma without complication  J45.40     2. Flu vaccine need  Z23         Cough/Wheezing/Clinical asthma  - glad symbicort is helping and 2 puff twice daily is your best control dose (Below that was associated slightly less than optimal  control)  Plan - Continue Symbicort at  2 puff twice daily with albuterol as needed   5mm Nodule RUL - seen in CT angio jan 2021 - > no change oct 2023    Plan - no followup for nodule    Followup - text me as needed  - 9 months or sooner if needed   FOLLOWUP Return in about 9 months (around 06/17/2024) for 15 min visit, with Dr Bertrum Brodie, Face to Face Visit.    SIGNATURE    Dr. Maire Scot, M.D., F.C.C.P,  Pulmonary and Critical Care Medicine Staff Physician, [facility] Center Director - Interstitial Lung Disease  Program  Pulmonary Fibrosis [facility] at [facility] [place], [place], [address]  Pager: [phone], If no answer or between  15:00h - 7:00h: call [phone] Telephone: [phone]  1:48 PM 09/18/2023

## 2023-09-18 NOTE — Patient Instructions (Addendum)
     Cough/Wheezing/Clinical asthma  - glad symbicort is helping and 2 puff twice daily is your best control dose (Below that was associated slightly less than optimal control)  Plan - Continue Symbicort at  2 puff twice daily with albuterol as needed   5mm Nodule RUL - seen in CT angio jan 2021 - > no change oct 2023    Plan - no followup for nodule    Followup - text me as needed  - 9 months or sooner if needed

## 2023-09-19 ENCOUNTER — Encounter: Payer: Self-pay | Admitting: Internal Medicine

## 2023-09-19 DIAGNOSIS — E782 Mixed hyperlipidemia: Secondary | ICD-10-CM

## 2023-09-19 DIAGNOSIS — Z79899 Other long term (current) drug therapy: Secondary | ICD-10-CM

## 2023-09-20 ENCOUNTER — Telehealth: Payer: Self-pay | Admitting: Pharmacy Technician

## 2023-09-20 ENCOUNTER — Other Ambulatory Visit (HOSPITAL_COMMUNITY): Payer: Self-pay

## 2023-09-20 NOTE — Telephone Encounter (Signed)
Prior Authorization form/request asks a question that requires your assistance. Please see the question below and advise accordingly. The PA will not be submitted until the necessary information is received.   Patient will need updated labs to submit- insurance asks for labs within the last 120 days (4 months)

## 2023-09-20 NOTE — Telephone Encounter (Signed)
Pharmacy Patient Advocate Encounter   Received notification from Patient Advice Request messages that prior authorization for nexlizet is required/requested.   Insurance verification completed.   The patient is insured through [facility] [employer] [facility] .   Per test claim: PA required; PA submitted to above mentioned insurance via CoverMyMeds Key/confirmation #/[id number]` Status is pending

## 2023-09-26 NOTE — Addendum Note (Signed)
 Addended by: Bertram Millard on: 09/26/2023 03:55 PM   Modules accepted: Orders

## 2023-10-06 ENCOUNTER — Telehealth: Payer: Self-pay | Admitting: Internal Medicine

## 2023-10-06 DIAGNOSIS — J4540 Moderate persistent asthma, uncomplicated: Secondary | ICD-10-CM

## 2023-10-06 MED ORDER — FLUTICASONE FUROATE-VILANTEROL 100-25 MCG/ACT IN AEPB: 1.0000 | INHALATION_SPRAY | Freq: Every day | RESPIRATORY_TRACT | 3 refills | Status: DC

## 2023-10-06 NOTE — Telephone Encounter (Signed)
 Cynthia Padilla Says [facility] insurance preferes breo or [place]. She and I disucssued and I sent breo    SIGNATURE    Dr. Kalman Shan, M.D., F.C.C.P,  Pulmonary and Critical Care Medicine Staff Physician, [facility] Center Director - Interstitial Lung Disease  Program  Pulmonary Fibrosis [facility] at [facility] [place], [place], [address]   Pager: [phone], If no answer  -> Check AMION or Try [phone] Telephone (clinical office): [phone] Telephone (research): [phone]  5:13 PM 10/06/2023

## 2023-10-11 LAB — LAB REPORT - SCANNED
A1c: 6
EGFR: 70.1

## 2023-10-18 ENCOUNTER — Encounter: Payer: Self-pay | Admitting: Internal Medicine

## 2023-10-20 ENCOUNTER — Other Ambulatory Visit (HOSPITAL_COMMUNITY): Payer: Self-pay

## 2023-10-20 ENCOUNTER — Telehealth: Payer: Self-pay | Admitting: Pharmacy Technician

## 2023-10-20 ENCOUNTER — Other Ambulatory Visit: Payer: Self-pay

## 2023-10-20 DIAGNOSIS — I10 Essential (primary) hypertension: Secondary | ICD-10-CM

## 2023-10-20 DIAGNOSIS — E782 Mixed hyperlipidemia: Secondary | ICD-10-CM

## 2023-10-20 DIAGNOSIS — Z79899 Other long term (current) drug therapy: Secondary | ICD-10-CM

## 2023-10-20 DIAGNOSIS — I2510 Atherosclerotic heart disease of native coronary artery without angina pectoris: Secondary | ICD-10-CM

## 2023-10-20 NOTE — Telephone Encounter (Addendum)
 Pharmacy Patient Advocate Encounter   Received notification from Patient Advice Request messages that prior authorization for nexlizet is required/requested.   Insurance verification completed.   The patient is insured through [employer] .   Per test claim: PA required; PA submitted to above mentioned insurance via Fax Key/confirmation #/EOC X Status is pending    APPEAL FAXED TO [phone]

## 2023-10-23 ENCOUNTER — Other Ambulatory Visit (HOSPITAL_COMMUNITY): Payer: Self-pay

## 2023-10-25 ENCOUNTER — Other Ambulatory Visit (HOSPITAL_COMMUNITY): Payer: Self-pay

## 2023-10-27 ENCOUNTER — Other Ambulatory Visit (HOSPITAL_COMMUNITY): Payer: Self-pay

## 2023-10-27 NOTE — Telephone Encounter (Signed)
 Hi, I saw your note you are out of the office but I wanted to send to you as requested. The pharmacy is filling this for her and I called her to make her aware it is getting ready for her now.   Pharmacy Patient Advocate Encounter  Received notification from [facility] that Prior Authorization for nexlizet has been APPROVED from 10/27/23 to 04/26/24. Spoke to pharmacy to process.Copay is $47.00.

## 2024-01-15 ENCOUNTER — Other Ambulatory Visit: Payer: Self-pay | Admitting: Obstetrics and Gynecology

## 2024-01-15 DIAGNOSIS — Z1231 Encounter for screening mammogram for malignant neoplasm of breast: Secondary | ICD-10-CM

## 2024-01-17 ENCOUNTER — Other Ambulatory Visit: Payer: Self-pay

## 2024-02-21 ENCOUNTER — Ambulatory Visit

## 2024-02-26 ENCOUNTER — Ambulatory Visit
Admission: RE | Admit: 2024-02-26 | Discharge: 2024-02-26 | Disposition: A | Discharge status: Home / Self Care | Source: Ambulatory Visit | Attending: Obstetrics and Gynecology | Admitting: Obstetrics and Gynecology

## 2024-02-26 DIAGNOSIS — Z1231 Encounter for screening mammogram for malignant neoplasm of breast: Secondary | ICD-10-CM

## 2024-03-08 ENCOUNTER — Telehealth: Payer: Self-pay | Admitting: Internal Medicine

## 2024-03-08 DIAGNOSIS — I10 Essential (primary) hypertension: Secondary | ICD-10-CM

## 2024-03-08 DIAGNOSIS — R0602 Shortness of breath: Secondary | ICD-10-CM

## 2024-03-08 DIAGNOSIS — I2510 Atherosclerotic heart disease of native coronary artery without angina pectoris: Secondary | ICD-10-CM

## 2024-03-08 DIAGNOSIS — E782 Mixed hyperlipidemia: Secondary | ICD-10-CM

## 2024-03-08 DIAGNOSIS — Z79899 Other long term (current) drug therapy: Secondary | ICD-10-CM

## 2024-03-08 NOTE — Telephone Encounter (Signed)
  Pt c/o Shortness Of Breath: STAT if SOB developed within the last 24 hours or pt is noticeably SOB on the phone   1. Are you currently SOB (can you hear that pt is SOB on the phone)? No   2. How long have you been experiencing SOB? Last week or so in the morning   3. Are you SOB when sitting or when up moving around? Sitting   4. Are you currently experiencing any other symptoms? No

## 2024-03-08 NOTE — Telephone Encounter (Signed)
 Spoke to patient she stated she has noticed this week hard to get a good breath at times.No chest pain.She feels ok at present.[place]Appointment scheduled with Callie Goodrich PA 8/12 at 8:25 am.I will make Dr.Ross aware.

## 2024-03-09 NOTE — Progress Notes (Signed)
 Cardiology Office Note:    Date:  03/20/2024   ID:  [name], [name] [birth year], MRN [id number]  PCP:  [name], MD  Cardiologist:  [name], MD     Referring MD: [name], MD   Chief Complaint: shortness of breath  History of Present Illness:    Cynthia Padilla is a 73 y.o. female with a history of coronary artery calcifications with coronary calcium  score of 51.9 (66th percentile) in 08/2018, hypertension, hyperlipidemia, and asthma who is followed by Dr. Gull who presents today for evaluation of shortness of breath.   Patient was referred to Dr. Gull in 2017 for further evaluation of chest pain. This was felt to be non-cardiac and likely GI in nature. No additional work-up was felt to be needed at that time. Coronary calcium  score was later ordered in 08/2018 and was 51.9 (66th percentile for age). It also showed a ascending thoracic aortic aneurysm of up to 4.1cm. However, repeat chest CTA in 08/2019 showed no evidence of thoracic aortic aneurysm.   She was last seen by Dr. Gull in 06/2023 at which time she was doing well with no chest pain or shortness of breath.   Patient called our office on 03/08/2024 with reports of shortness of breath. Therefore, this visit was scheduled for further evaluation.  She denies shortness of breath but describes these weird episodes where she will suddenly feel like she needs to take a deep breath.  She will take one good deep breath and then the sensation is gone. She states this has been going on for a few weeks. No other shortness of breath. No recent upper respiratory infections.  She does have asthma but states this feels different than her asthma.  She goes to Pilates twice a week and does great with that with no shortness of breath.  No chest pain, orthopnea, PND, edema.  No palpitations.  She had one episode of lightheadedness yesterday when she was sitting at her computer and turned her head quickly.  She does have a history  of vertigo and this reminded her of that.  However, this was an isolated event.  No other lightheadedness/dizziness.  No syncope.  We reviewed most recent labs from 03/18/2024 including CBC, CMET, TSH, Pro-BNP, and NMR lipoprofile which were all unremarkable.   EKGs/Labs/Other Studies Reviewed:    The following studies were reviewed:  CT Cardiac Scoring 08/09/2018: Impressions: 1. Coronary artery calcium  score is 51.9. This calcium  score is at percentile 66 for subjects of the same age, gender and ethnicity. 2. Aortic aneurysm NOS (ICD10-I71.9). 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 Recommend annual imaging followup by CTA or MRA. This recommendation follows 2010 ACCF/AHA/AATS/ACR/ASA/SCA/SCAI/SIR/STS/SVM Guidelines for the Diagnosis and Management of Patients with Thoracic Aortic Disease. Circulation. 2010; 121: [id number]. 3.  Aortic Atherosclerosis (ICD10-I70.0).    EKG:  EKG ordered today.   EKG Interpretation Date/Time:  Wednesday March 20 2024 10:53:52 EDT Ventricular Rate:  60 PR Interval:  160 QRS Duration:  92 QT Interval:  430 QTC Calculation: 430 R Axis:   -35  Text Interpretation: Normal sinus rhythm Left axis deviation Minimal voltage criteria for LVH, may be normal variant ( R in aVL ) No significant change since last tracing Confirmed by Mardell Cragg [phone]) on 03/20/2024 11:07:24 AM    Recent Labs: 03/18/2024: ALT 14; BUN 13; Creatinine, Ser 0.86; Hemoglobin 14.2; NT-Pro BNP 158; Platelets 314; Potassium 4.6; Sodium 140; TSH 1.660  Recent Lipid Panel  Component Value Date/Time   CHOL 143 05/25/2022 0942   TRIG 77 05/25/2022 0942   HDL 56 05/25/2022 0942   CHOLHDL 2.6 05/25/2022 0942   CHOLHDL 3 01/29/2014 0813   VLDL 20.2 01/29/2014 0813   LDLCALC 72 05/25/2022 0942   LDLDIRECT 125.6 03/28/2012 0922    Physical Exam:    Vital Signs: BP 124/72   Pulse 60   Ht 5' 7 (1.702 m)   Wt 191 lb 9.6 oz (86.9 kg)   SpO2 96%   BMI  30.01 kg/m     Wt Readings from Last 3 Encounters:  03/20/24 191 lb 9.6 oz (86.9 kg)  09/18/23 194 lb 12.8 oz (88.4 kg)  06/21/23 193 lb (87.5 kg)     General: 73 y.o. Caucasian female in no acute distress. HEENT: Normocephalic and atraumatic. Sclera clear.  Neck: Supple. No carotid bruits. No JVD. Heart: RRR. Distinct S1 and S2. No murmurs, gallops, or rubs.  Lungs: No increased work of breathing. Clear to ausculation bilaterally. No wheezes, rhonchi, or rales.  Extremities: No lower extremity edema.   Skin: Warm and dry. Neuro: No focal deficits. Psych: Normal affect. Responds appropriately.  Assessment:    1. SOB (shortness of breath)   2. Coronary artery calcification   3. Primary hypertension   4. Hyperlipidemia, unspecified hyperlipidemia type     Plan:    Shortness of Breath Today's visit was scheduled for further evaluation of shortness of breath.  She denies any shortness of breath to me but describes occasional episodes where she will suddenly feel like she needs to take a deep breath.  She will take one good deep breath and that sensation will resolve. No other shortness of breath. - EKG today shows no acute ischemic changes. - Euvolemic on exam. No signs or symptoms of CHF. Pro-BNP on 03/18/2024 was normal. - Does not sound like CHF or an anginal equivalent. However, she has never had an Echo so will order.  - She does have a history of asthma but states this feel different than that. Recommend she trying using her rescue inhaler during one of these epsidoes to see if that helps.  Coronary Artery Calcifications Coronary calcium  score in 08/2018 was 51.9 (66th percentile).  - No chest pain.  - Intolerant to statins in the past. Continue Nexlizet .   Hypertension BP well controlled. - Continue HCTZ 25mg  daily.   Hyperlipidemia NMR lipoprofile on 03/18/2024: Total Cholesterol 148, Triglycerides 74, HDL 59, LDL 75. Small LDL particle number elevated at 623 and LP-IR  Score elevated at 65. LDL goal <70.  - Intolerant to multiple statins and Praluent in the past. - Continue Nexlizet  180-10mg  daily.   Disposition: Patient already has follow-up with Dr. Okey scheduled for 06/2024.   Signed, Aline FORBES Door, PA-C  03/20/2024 11:43 AM    [facility]

## 2024-03-11 NOTE — Telephone Encounter (Signed)
 Set patient up for labs before appt CBC, TSH, CMET, BNP, NMR panel

## 2024-03-12 ENCOUNTER — Other Ambulatory Visit: Payer: Self-pay | Admitting: *Deleted

## 2024-03-12 DIAGNOSIS — I2510 Atherosclerotic heart disease of native coronary artery without angina pectoris: Secondary | ICD-10-CM

## 2024-03-12 DIAGNOSIS — Z79899 Other long term (current) drug therapy: Secondary | ICD-10-CM

## 2024-03-12 DIAGNOSIS — I10 Essential (primary) hypertension: Secondary | ICD-10-CM

## 2024-03-12 DIAGNOSIS — R0602 Shortness of breath: Secondary | ICD-10-CM

## 2024-03-12 DIAGNOSIS — E782 Mixed hyperlipidemia: Secondary | ICD-10-CM

## 2024-03-12 NOTE — Telephone Encounter (Signed)
 Patient notified. Order placed.

## 2024-03-12 NOTE — Addendum Note (Signed)
 Addended by: Yamir Carignan L on: 03/12/2024 08:46 AM   Modules accepted: Orders

## 2024-03-19 LAB — CBC
Hematocrit: 43.9 % (ref 34.0–46.6)
Hemoglobin: 14.2 g/dL (ref 11.1–15.9)
MCH: 30.9 pg (ref 26.6–33.0)
MCHC: 32.3 g/dL (ref 31.5–35.7)
MCV: 95 fL (ref 79–97)
Platelets: 314 x10E3/uL (ref 150–450)
RBC: 4.6 x10E6/uL (ref 3.77–5.28)
RDW: 11.6 % — ABNORMAL LOW (ref 11.7–15.4)
WBC: 7.2 x10E3/uL (ref 3.4–10.8)

## 2024-03-19 LAB — COMPREHENSIVE METABOLIC PANEL WITH GFR
ALT: 14 IU/L (ref 0–32)
AST: 25 IU/L (ref 0–40)
Albumin: 4.5 g/dL (ref 3.8–4.8)
Alkaline Phosphatase: 71 IU/L (ref 44–121)
BUN/Creatinine Ratio: 15 (ref 12–28)
BUN: 13 mg/dL (ref 8–27)
Bilirubin Total: 0.4 mg/dL (ref 0.0–1.2)
CO2: 26 mmol/L (ref 20–29)
Calcium: 9.9 mg/dL (ref 8.7–10.3)
Chloride: 99 mmol/L (ref 96–106)
Creatinine, Ser: 0.86 mg/dL (ref 0.57–1.00)
Globulin, Total: 2 g/dL (ref 1.5–4.5)
Glucose: 107 mg/dL — ABNORMAL HIGH (ref 70–99)
Potassium: 4.6 mmol/L (ref 3.5–5.2)
Sodium: 140 mmol/L (ref 134–144)
Total Protein: 6.5 g/dL (ref 6.0–8.5)
eGFR: 70 mL/min/1.73 (ref >59)

## 2024-03-19 LAB — NMR, LIPOPROFILE
Cholesterol, Total: 148 mg/dL (ref 100–199)
HDL Particle Number: 47.4 umol/L (ref >=30.5)
HDL-C: 59 mg/dL (ref >39)
LDL Particle Number: 997 nmol/L (ref <1000)
LDL Size: 20.8 nm (ref >20.5)
LDL-C (NIH Calc): 75 mg/dL (ref 0–99)
LP-IR Score: 65 — ABNORMAL HIGH (ref <=45)
Small LDL Particle Number: 623 nmol/L — ABNORMAL HIGH (ref <=527)
Triglycerides: 74 mg/dL (ref 0–149)

## 2024-03-19 LAB — TSH: TSH: 1.66 u[IU]/mL (ref 0.450–4.500)

## 2024-03-19 LAB — PRO B NATRIURETIC PEPTIDE: NT-Pro BNP: 158 pg/mL (ref 0–738)

## 2024-03-20 ENCOUNTER — Encounter: Payer: Self-pay | Admitting: Student

## 2024-03-20 ENCOUNTER — Ambulatory Visit: Attending: Student | Admitting: Student

## 2024-03-20 ENCOUNTER — Ambulatory Visit: Payer: Self-pay | Admitting: *Deleted

## 2024-03-20 VITALS — BP 124/72 | HR 60 | Ht 67.0 in | Wt 191.6 lb

## 2024-03-20 DIAGNOSIS — E785 Hyperlipidemia, unspecified: Secondary | ICD-10-CM

## 2024-03-20 DIAGNOSIS — R0602 Shortness of breath: Secondary | ICD-10-CM | POA: Diagnosis not present

## 2024-03-20 DIAGNOSIS — I10 Essential (primary) hypertension: Secondary | ICD-10-CM

## 2024-03-20 DIAGNOSIS — I2510 Atherosclerotic heart disease of native coronary artery without angina pectoris: Secondary | ICD-10-CM

## 2024-03-20 NOTE — Patient Instructions (Signed)
 Medication Instructions:  Your physician recommends that you continue on your current medications as directed. Please refer to the Current Medication list given to you today.  *If you need a refill on your cardiac medications before your next appointment, please call your pharmacy*  Lab Work: NONE If you have labs (blood work) drawn today and your tests are completely normal, you will receive your results only by: MyChart Message (if you have MyChart) OR A paper copy in the mail If you have any lab test that is abnormal or we need to change your treatment, we will call you to review the results.  Testing/Procedures: Your physician has requested that you have an echocardiogram. Echocardiography is a painless test that uses sound waves to create images of your heart. It provides your doctor with information about the size and shape of your heart and how well your heart's chambers and valves are working. This procedure takes approximately one hour. There are no restrictions for this procedure. Please do NOT wear cologne, perfume, aftershave, or lotions (deodorant is allowed). Please arrive 15 minutes prior to your appointment time.  Please note: We ask at that you not bring children with you during ultrasound (echo/ vascular) testing. Due to room size and safety concerns, children are not allowed in the ultrasound rooms during exams. Our front office staff cannot provide observation of children in our lobby area while testing is being conducted. An adult accompanying a patient to their appointment will only be allowed in the ultrasound room at the discretion of the ultrasound technician under special circumstances. We apologize for any inconvenience.   Follow-Up: At [facility], you and your health needs are our priority.  As part of our continuing mission to provide you with exceptional heart care, our providers are all part of one team.  This team includes your primary Cardiologist  (physician) and Advanced Practice Providers or APPs (Physician Assistants and Nurse Practitioners) who all work together to provide you with the care you need, when you need it.  Your next appointment:   KEEP SCHEDULED FOLLOW-UP  We recommend signing up for the patient portal called MyChart.  Sign up information is provided on this After Visit Summary.  MyChart is used to connect with patients for Virtual Visits (Telemedicine).  Patients are able to view lab/test results, encounter notes, upcoming appointments, etc.  Non-urgent messages can be sent to your provider as well.   To learn more about what you can do with MyChart, go to [email or web address].

## 2024-04-23 ENCOUNTER — Other Ambulatory Visit (HOSPITAL_COMMUNITY)

## 2024-04-25 ENCOUNTER — Ambulatory Visit (HOSPITAL_COMMUNITY)
Admission: RE | Admit: 2024-04-25 | Discharge: 2024-04-25 | Disposition: A | Discharge status: Home / Self Care | Source: Ambulatory Visit | Attending: Internal Medicine | Admitting: Internal Medicine

## 2024-04-25 DIAGNOSIS — R0602 Shortness of breath: Secondary | ICD-10-CM | POA: Diagnosis present

## 2024-04-25 LAB — ECHOCARDIOGRAM COMPLETE
Area-P 1/2: 3.79 cm2
S' Lateral: 2.56 cm

## 2024-04-27 ENCOUNTER — Ambulatory Visit: Payer: Self-pay | Admitting: Student

## 2024-05-07 ENCOUNTER — Encounter: Payer: Self-pay | Admitting: Internal Medicine

## 2024-05-07 DIAGNOSIS — I77810 Thoracic aortic ectasia: Secondary | ICD-10-CM

## 2024-05-08 ENCOUNTER — Other Ambulatory Visit (HOSPITAL_COMMUNITY): Payer: Self-pay

## 2024-05-08 NOTE — Telephone Encounter (Signed)
 Pharmacy Patient Advocate Encounter   Received notification from Physician's Office that prior authorization for NEXLIZET  is required/requested.   Insurance verification completed.   The patient is insured through [employer].   Per test claim: PA required; PA submitted to above mentioned insurance via Latent Key/confirmation #/EOC [id number] Status is pending

## 2024-05-10 ENCOUNTER — Other Ambulatory Visit (HOSPITAL_COMMUNITY): Payer: Self-pay

## 2024-05-10 NOTE — Telephone Encounter (Addendum)
 Pharmacy Patient Advocate Encounter  Received notification from [facility] that Prior Authorization for NEXLIZET  has been APPROVED from 05/08/24 to 08/07/25. Unable to obtain price due to refill too soon rejection, last fill date 05/08/24 next available fill date 05/31/24

## 2024-05-13 ENCOUNTER — Encounter: Payer: Self-pay | Admitting: Internal Medicine

## 2024-05-13 ENCOUNTER — Ambulatory Visit: Admitting: Internal Medicine

## 2024-05-13 ENCOUNTER — Ambulatory Visit: Payer: Self-pay | Admitting: Internal Medicine

## 2024-05-13 VITALS — BP 118/62 | HR 60 | Temp 97.7°F | Ht 67.0 in | Wt 190.8 lb

## 2024-05-13 DIAGNOSIS — J4541 Moderate persistent asthma with (acute) exacerbation: Secondary | ICD-10-CM

## 2024-05-13 DIAGNOSIS — J4540 Moderate persistent asthma, uncomplicated: Secondary | ICD-10-CM

## 2024-05-13 LAB — CBC WITH DIFFERENTIAL/PLATELET
Basophils Absolute: 0.1 K/uL (ref 0.0–0.1)
Basophils Relative: 1.1 % (ref 0.0–3.0)
Eosinophils Absolute: 0.3 K/uL (ref 0.0–0.7)
Eosinophils Relative: 4.5 % (ref 0.0–5.0)
HCT: 41.6 % (ref 36.0–46.0)
Hemoglobin: 13.9 g/dL (ref 12.0–15.0)
Lymphocytes Relative: 39.8 % (ref 12.0–46.0)
Lymphs Abs: 3.1 K/uL (ref 0.7–4.0)
MCHC: 33.5 g/dL (ref 30.0–36.0)
MCV: 92.2 fl (ref 78.0–100.0)
Monocytes Absolute: 0.7 K/uL (ref 0.1–1.0)
Monocytes Relative: 8.7 % (ref 3.0–12.0)
Neutro Abs: 3.6 K/uL (ref 1.4–7.7)
Neutrophils Relative %: 45.9 % (ref 43.0–77.0)
Platelets: 303 K/uL (ref 150.0–400.0)
RBC: 4.52 Mil/uL (ref 3.87–5.11)
RDW: 12.7 % (ref 11.5–15.5)
WBC: 7.7 K/uL (ref 4.0–10.5)

## 2024-05-13 LAB — POCT EXHALED NITRIC OXIDE: FeNO level (ppb): 36

## 2024-05-13 MED ORDER — PREDNISONE 10 MG PO TABS: ORAL_TABLET | ORAL | 0 refills | Status: AC

## 2024-05-13 NOTE — Patient Instructions (Addendum)
 ASthma exacerbation Cough/Wheezing/Clinical asthma  - My concern with prior high feno and now intermediate feno and with symptoms that your asthma is active  Plan - check blood cbc with diff 05/13/2024 - Continue Symbicort at  2 puff twice daily with albuterol as needed - Please take prednisone  40 mg x1 day, then 30 mg x1 day, then 20 mg x1 day, then 10 mg x1 day, and then 5 mg x1 day and stop - based on course consider biologic treatmetn add on  FLu shot  Plan  - defer 05/13/2024 but get it in  a week or so    5mm Nodule RUL - seen in CT angio jan 2021 - > no change oct 2023    Plan - no followup for nodule    Followup - 3 months spirometry and dlco and 15 min visit with Dr Geronimo

## 2024-05-13 NOTE — Progress Notes (Signed)
 Eosinophils are high 300cells but  not as high as last year . Let me know how the prednisone  works./ Thanks

## 2024-05-13 NOTE — Progress Notes (Signed)
 OV 05/20/2020  Subjective:  Patient ID: Cynthia Padilla, female , DOB: June 09, 1951 , age 73 y.o. , MRN: [id number] , ADDRESS: [address] [place] [address] PCP Cynthia Hamilton, MD Patient Care Team: Cynthia Hamilton, MD as PCP - General (Internal Medicine) Cynthia Vina GAILS, MD as PCP - Cardiology (Cardiology)  This Provider for this visit: Treatment Team:  Attending Provider: Geronimo Amel, MD    05/20/2020 -   Chief Complaint  Patient presents with   Consult    Pt is here due to a persistent cough she has had since 8 months. Pt also has had an occ sore throat and sinus problems also with wheezing which has subsided.     HPI Cynthia Padilla 73 y.o. -former Psychologist, prison and probation services at [employer].  She is the wife of my patient Cynthia Padilla who suffers from asthma and who is on interleukin-5 receptor blockade.  I have known [name] through her visits accompanying her husband.  In this particular visit she presents as the patient herself.  She tells me that she was at baseline health up until March 2021.  She started having dry cough.  Along with this during acceleration there was some wheezing.  She saw primary care physician who gave her prednisone  Breo and [employer].  Then within a few weeks things resolved.  Then she came off Breo.  I believe the prednisone  initially was for 10 days.  Then again in May same thing happened.  She took another prednisone  for 10 days.  This time took Symbicort 1 puff twice daily along with albuterol.  There was some rattling and mucus.  Apparently chest x-ray was negative.  She did go to an urgent care when the chest x-ray was done.  She was given Decadron .  After that she got better and then was doing completely well till September 2021.  In the gap between May 2021 and September 2021 she is not taking any Symbicort or any inhaled steroid.  Then in September 2021 the same thing happen and around mid September she took prednisone  for  10 days.  Then by end of September same thing recurred.  This time was a little bit different started as a sore throat and then went up the sinuses Covid testing x2 was negative.  She says she has some cough wheezing nocturnal symptoms cough is mostly dry but sometimes she has mucus production.  This time she is not taking prednisone  but she is doing Symbicort.  She feels after a few weeks things are improving.  At baseline she has hypertension she is not on ACE inhibitor she is on hydrochlorothiazide .  She has acid reflux but not symptomatic anymore.  In the past she took PPI for many years and then lost weight and followed a healthy diet and got rid of acid reflux.  Her last prednisone  was in September 2021.  Of note she is concerned about the discoloration in her tongue.  She describes this is black coating.  On my exam it was gray-green.  She admitted to using cough lozenges of a similar color for the last 2 weeks and the discoloration present only for the last 1 week.  Her RSI cough score as recently experienced is as below.     Of note her last lung imaging was in January 2021 when she had CT angiogram chest for slightly enlarged aorta.  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  She  follows with Dr. Vina Cynthia for this.  On the lung images the mediastinum was felt to be normal and the pulmonary parenchyma is felt to be normal.  She does have a 5 mm peripheral right upper lobe nodule.  It appears that Dr. Gull reviewed the CT scan along with old ones from a previous health system.  And she has had these nodules for some years and it is stable.  [name]  She has not been a smoker.   Other issue is that she is not vaccinated herself against COVID.  She normally gets vaccines.  But apparently a few years ago teach took the shingles vaccine [it was not Shingrix but the zoster] and she had right arm swelling.  Therefore she  is can of petrified.  But recently she not had issues with flu shot or other vaccines in terms of major allergic reactions.  We discussed the overall incidence of anaphylaxis and that the risk is being 2% but the risk is lower in patientts in  patients were generated to have tolerated vaccines without any allergic reaction.  We discussed the 3 options.  Recommended anecdotally Pfizer over the other 2.  ROS - per HPI  Results for CHAELA, BRANSCUM (MRN [id number]) as of 05/20/2020 15:55  Ref. Range 07/19/2011 08:32 03/28/2012 09:22 03/27/2013 08:47 01/29/2014 08:13 03/06/2014 12:13  Eosinophils Absolute Latest Ref Range: 0.0 - 0.7 K/uL 0.2 0.1 0.1 0.1 0.1   CT angiogram chest August 19, 2019 -.IMPRESSION: 1. No evidence for thoracic aortic aneurysm on this contrast enhanced study. The ascending thoracic aorta measures approximately 3.8 cm on this examination. 2. There is a 5 mm pulmonary nodule in the peripheral right upper lobe. No follow-up needed if patient is low-risk. Non-contrast chest CT can be considered in 12 months if patient is high-risk. This recommendation follows the consensus statement: Guidelines for Management of Incidental Pulmonary Nodules Detected on CT Images: From the Fleischner Society 2017; Radiology 2017; 284:228-243.     Electronically Signed   By: Cynthia Padilla M.D.   On: 08/19/2019 16:33   No results found for: NITRICOXIDE    OV 09/09/2020  Subjective:  Patient ID: Cynthia Padilla, female , DOB: 02-Jun-1951 , age 70 y.o. , MRN: [id number] , ADDRESS: [address] [place] [address] PCP Cynthia Hamilton, MD Patient Care Team: Cynthia Hamilton, MD as PCP - General (Internal Medicine) Cynthia Vina GAILS, MD as PCP - Cardiology (Cardiology)  This Provider for this visit: Treatment Team:  Attending Provider: Geronimo Amel, MD    09/09/2020 -   Chief Complaint  Patient presents with   Follow-up    Doing well     HPI [name] 73 y.o.  -presents for asthma follow-up.  She continues to do well.  The Symbicort is really helping her but causes hoarseness of voice.  Therefore it is a difficult balance.  She is found that if she takes Symbicort at night once daily then it is fine.  Nevertheless the last few days she has some hoarseness which she believes is from Symbicort.  There is no respiratory illness.  We offered the option of switching to Pulmicort nebulizer but at this point she just wants to continue with Symbicort at night.  In terms of COVID-19: She is not yet vaccinated.  She is fearful of the side effects.  Because of this she is unable to travel to [place].  She checked an IgG  after a respiratory infection in the fall with IgG was negative.  Therefore she had to cancel her [place] trip.  She is also not able to travel to [place] because of all these restrictions.  She has asthma and age 84.  She has some risk factors for getting serious Covid.  Therefore I discussed the option about referring for monoclonal antibody Evusheld but at this point in time she wants to hold off.  She wants to talk to her travel operator [employer] about making a decision.  She is hopeful that policy restrictions on travel will lift off.  We did did discuss the option about antivirals if she were to get Covid.  She will continue to stay socially distance and mask and take appropriate precautions.      OV 06/21/2023  Subjective:  Patient ID: Cynthia Padilla, female , DOB: [birth year] , age 72 y.o. , MRN: [id number] , ADDRESS: [address] [place] [address] PCP Cynthia Hamilton, MD Patient Care Team: Cynthia Hamilton, MD as PCP - General (Internal Medicine) Cynthia Vina GAILS, MD as PCP - Cardiology (Cardiology) Abran Norleen SAILOR, MD as Consulting Physician (Gastroenterology)  This Provider for this visit: Treatment Team:  Attending Provider: Geronimo Amel, MD    06/21/2023 -   Chief Complaint  Patient presents with   Follow-up     Breathing has overall been doing well. She has been wheezing some. Has been taking symbicort more frequently. She has had some PND and cough with yellow sputum.      HPI [place] 73 y.o. -returns for follow-up of her asthma.  Have not actually seen her more than 2 and half years although she is accompanied her husband who has eosinophilic asthma on visits.  Her husband himself in October 2024 had hip replacement and is doing well.  They both are looking at changing the insurance from [place] to [place] [employer] because [place] [employer] has better coverage with [place].  She is currently using her Symbicort 1 puff twice daily.  She reported asthma control is good ACT score is 24.  However she does admit that occasionally she will have a wheeze.  Is not to the point that she is waking up at night it is not to the point that she is having to use albuterol for rescue.  But when she takes a deep breath and exhale she will hear the wheeze.  Infectious able to reproduce that in the office.  In auscultation there was no wheeze.  Her exhaled nitric oxide  though today was elevated at 61 ppb suggesting suboptimal control of asthma.  She is agreed to have a flu shot today.    OV 09/18/2023  Subjective:  Patient ID: Cynthia Padilla, female , DOB: [birth year] , age 41 y.o. , MRN: [id number] , AD[address]ESS: [address] [place] [address] PCP Cynthia Hamilton, MD Patient Care Team: Cynthia Hamilton, MD as PCP - General (Internal Medicine) Cynthia Vina GAILS, MD as PCP - Cardiology (Cardiology) Abran Norleen SAILOR, MD as Consulting Physician (Gastroenterology)  This Provider for this visit: Treatment Team:  Attending Provider: Geronimo Amel, MD    09/18/2023 -   Chief Complaint  Patient presents with   Follow-up    Doing well.     HPI [place] 73 y.o. -follow-up asthma associated with slightly high IgE 187 and blood eosinophils 600 cells per cubic millimeter.  Last seen mid November  2024.  At that point in time she was on Symbicort 1  puff twice daily.  She thought she was doing well but her Joycie was high and I had some wheezing.  Blood testing showed slightly elevated IgE and blood eosinophils but RAST panel was otherwise negative.  After that she has been on 2 puff 2 times daily and is here for follow-up.  She says she is doing well.  No albuterol rescue use.  Feels asthma is well-controlled.  She does not feel the need for additional biologic therapy or additional medications to control her asthma.    OV 05/13/2024  Subjective:  Patient ID: Cynthia Padilla, female , DOB: 07-25-1951 , age 2 y.o. , MRN: [id number] , AD[address]ESS: [address] [place] [address] PCP Cynthia Hamilton, MD Patient Care Team: Cynthia Hamilton, MD as PCP - General (Internal Medicine) Cynthia Vina GAILS, MD as PCP - Cardiology (Cardiology) Abran Norleen SAILOR, MD as Consulting Physician (Gastroenterology)  This Provider for this visit: Treatment Team:  Attending Provider: Geronimo Amel, MD    05/13/2024 -   Chief Complaint  Patient presents with   Asthma    Pt states she has struggled with SOB but states she thinks is controlled Dry cough w/ wheezing  No flu shot     HPI [facility] 73 y.o. -returns for follow-up.  She tells me that she is worried about her symptoms.   She tells me that she feels she needs to take a deep breath just to get enough air.  This resulted in echocardiogram because she is worried about her aortic aneurysm.  The echo suggested the aortic aneurysm might be slightly more prominent at 4.3 cm and therefore CTA has been recommended.  But the echo also showed grade 1 diastolic dysfunction.  Upon further questioning it appears that when she exerts occasionally there is wheeze.  Also for the last 6 weeks she is coughing and occasionally she has clear sputum.  She is compliant with the Symbicort.  A year ago her ehaled nitric oxide  was high as was her blood eosinophils  that have steadily increased to 600 cells per cubic millimeter and her blood IgE.  However at that time we took a shared decision making to remain on inhaled corticosteroid/long-acting beta agonist.  We do not escalate to biologic therapy.  Chest x-ray at that time was more consistent with asthma.  Also of note she had texted me back in July 2025.  At that time she returned from a cruise in [place]  and she developed wheezing but then she reports she was doing okay.  I offered for her to take some prednisone  but she felt she was resolving her symptoms but currently she feels she is declined.  She is more open to taking a short course prednisone .  Lab Results  Component Value Date   NITRICOXIDE 61 06/21/2023    Lab Results  Component Value Date   NITRICOXIDE 61 06/21/2023   This result suggests high (>/= 50) Type 2 (T2) airway inflammation. This supports inhaled corticosteroid responsiveness and potential eligibility for biologic therapies targeting Type 2 inflammation.    FENO 05/13/2024 is 32 and  Lab Results  Component Value Date   NITRICOXIDE 61 06/21/2023   This result suggests intermediate (25-49) Type 2 (T2) airway inflammation; clinical correlation required.   PFT     Latest Ref Rng & Units 07/13/2020   10:58 AM  PFT Results  FVC-Pre L 3.47   FVC-Predicted Pre % 104   FVC-Post L 3.47   FVC-Predicted Post % 103  Pre FEV1/FVC % % 77   Post FEV1/FCV % % 82   FEV1-Pre L 2.66   FEV1-Predicted Pre % 105   FEV1-Post L 2.86   DLCO uncorrected ml/min/mmHg 26.37   DLCO UNC% % 123   DLCO corrected ml/min/mmHg 26.37   DLCO COR %Predicted % 123   DLVA Predicted % 119   TLC L 6.11   TLC % Predicted % 111   RV % Predicted % 116      Latest Reference Range & Units 10/03/06 08:23 11/01/06 11:20 09/19/07 08:27 12/11/08 09:13 03/23/10 08:46 03/25/11 08:22 07/19/11 08:32 03/28/12 09:22 03/27/13 08:47 01/29/14 08:13 03/06/14 12:13 05/20/20 16:35 05/20/21 12:32 06/21/23 15:58   Eosinophils Absolute 0.0 - 0.7 K/uL 0.1 0.1 0.1 0.1 0.1 0.2 0.2 0.1 0.1 0.1 0.1 0.5 0.1 0.6     Latest Reference Range & Units 05/20/20 16:35 07/13/20 12:07 06/21/23 15:58  IgE (Immunoglobulin E), Serum <OR=114 kU/L 82 70 187 (H)  (H): Data is abnormally high LAB RESULTS last 96 hours No results found.       has a past medical history of Anxiety, Arthritis, Asthma, Diverticulitis, DIVERTICULOSIS, COLON (11/04/2004), GERD (gastroesophageal reflux disease), Hepatitis, History of colonic polyps, Hyperlipidemia, Hypertension, Internal hemorrhoids, PONV (postoperative nausea and vomiting), and Vertigo.   reports that she has never smoked. She has never used smokeless tobacco.  Past Surgical History:  Procedure Laterality Date   BREAST BIOPSY Right    ELBOW FRACTURE SURGERY Left    HERNIA REPAIR     pt denies   HIP SURGERY     L hip total, May 2023   LAMINECTOMY  10/11/2011   with fusion @ L4-5   RETINAL TEAR REPAIR CRYOTHERAPY Right 2015   TUBAL LIGATION      Allergies  Allergen Reactions   Atorvastatin  Other (See Comments)    Myalgias on 10mg  daily dosing   Fluconazole Other (See Comments)   Livalo  [Pitavastatin ]     Myalgias on 1mg  daily dosing   Praluent [Alirocumab]     Elbow pain on single injection of 75mg    Pravastatin      Myalgias on 20mg  daily and every other day   Rosuvastatin      Myalgias on 2.5mg  and 5mg  dose   Rosuvastatin  Calcium      Immunization History  Administered Date(s) Administered   Fluad Trivalent(High Dose 65+) 06/21/2023   INFLUENZA, HIGH DOSE SEASONAL PF 06/07/2017   Influenza Split 05/13/2013   Influenza-Unspecified 06/08/2014, 05/11/2015, 05/08/2016, 07/08/2017, 08/08/2018   Pneumococcal Conjugate-13 04/27/2015   Pneumococcal Polysaccharide-23 08/08/2010   Pneumococcal-Unspecified 05/11/2015   Td 01/07/2003   Tdap 02/04/2014   Zoster, Live 03/30/2010, 08/08/2010    Family History  Problem Relation Age of Onset   Ovarian cancer  Mother    Lung cancer Mother    Diabetes Father    Heart disease Father    Leukemia Father    Colon cancer Sister    Diabetes Sister        x 2   Rectal cancer Sister        anal   Diabetes Brother        x 3   Throat cancer Neg Hx    Kidney disease Neg Hx    Anesthesia problems Neg Hx    Liver disease Neg Hx    Breast cancer Neg Hx    Colon polyps Neg Hx    Esophageal cancer Neg Hx    Stomach cancer Neg Hx      Current Outpatient  Medications:    acetaminophen  (TYLENOL ) 500 MG tablet, Take 1,000 mg by mouth daily as needed for headache or moderate pain., Disp: , Rfl:    albuterol (VENTOLIN HFA) 108 (90 Base) MCG/ACT inhaler, SMARTSIG:1-2 Puff(s) Via Inhaler Every 4-6 Hours PRN, Disp: , Rfl:    Bempedoic Acid-Ezetimibe (NEXLIZET ) 180-10 MG TABS, Take 1 tablet by mouth daily., Disp: 90 tablet, Rfl: 3   Coenzyme Q10 (COQ10) 150 MG CAPS, Take 1 capsule by mouth as needed., Disp: , Rfl:    hydrochlorothiazide  (HYDRODIURIL ) 25 MG tablet, Take 1 tablet (25 mg total) by mouth every evening., Disp: 90 tablet, Rfl: 3   meclizine  (ANTIVERT ) 25 MG tablet, Take 25 mg by mouth as needed for dizziness., Disp: , Rfl:    meloxicam (MOBIC) 15 MG tablet, Take 15 mg by mouth as needed., Disp: , Rfl:    predniSONE  (DELTASONE ) 10 MG tablet, Take 4 tablets (40 mg total) by mouth daily with breakfast for 1 day, THEN 3 tablets (30 mg total) daily with breakfast for 1 day, THEN 2 tablets (20 mg total) daily with breakfast for 1 day, THEN 1 tablet (10 mg total) daily with breakfast for 1 day, THEN 0.5 tablets (5 mg total) daily with breakfast for 1 day., Disp: 10.5 tablet, Rfl: 0   Probiotic Product ([employer] PROBIOTIC) CAPS, SMARTSIG:1 Capsule(s) By Mouth, Disp: , Rfl:    SYMBICORT 80-4.5 MCG/ACT inhaler, Inhale 2 puffs into the lungs 2 (two) times daily., Disp: , Rfl:       Objective:   Vitals:   05/13/24 0932  BP: 118/62  Pulse: 60  Temp: 97.7 F (36.5 C)  TempSrc: Oral  SpO2: 98%  Weight: 190 lb  12.8 oz (86.5 kg)  Height: 5' 7 (1.702 m)    Estimated body mass index is 29.88 kg/m as calculated from the following:   Height as of this encounter: 5' 7 (1.702 m).   Weight as of this encounter: 190 lb 12.8 oz (86.5 kg).  @WEIGHTCHANGE @  [employer]   05/13/24 0932  Weight: 190 lb 12.8 oz (86.5 kg)     Physical Exam   General: No distress. Looks wwell O2 at rest: no Cane present: no Sitting in wheel chair: no Frail: no Obese: no Neuro: Alert and Oriented x 3. GCS 15. Speech normal Psych: Pleasant Resp:  Barrel Chest - no.  Wheeze - no, Crackles - no, No overt respiratory distress CVS: Normal heart sounds. Murmurs - no Ext: Stigmata of Connective Tissue Disease - no HEENT: Normal upper airway. PEERL +. No post nasal drip        Assessment/     Assessment & Plan Moderate persistent asthma without complication  Moderate persistent asthma with exacerbation    PLAN Patient Instructions  ASthma exacerbation Cough/Wheezing/Clinical asthma  - My concern with prior high feno and now intermediate feno and with symptoms that your asthma is active  Plan - check blood cbc with diff 05/13/2024 - Continue Symbicort at  2 puff twice daily with albuterol as needed - Please take prednisone  40 mg x1 day, then 30 mg x1 day, then 20 mg x1 day, then 10 mg x1 day, and then 5 mg x1 day and stop - based on course consider biologic treatmetn add on  FLu shot  Plan  - defer 05/13/2024 but get it in  a week or so    5mm Nodule RUL - seen in CT angio jan 2021 - > no change oct 2023    Plan -  no followup for nodule    Followup - 3 months spirometry and dlco and 15 min visit with Dr Geronimo BOSCH    Return for - 3 months spirometry and dlco and 15 min visit with Dr Geronimo.    SIGNATURE    Dr. [name] Geronimo, M.D., F.C.C.P,  Pulmonary and Critical Care Medicine Staff Physician, [facility] Center Director - Interstitial Lung Disease   Program  Pulmonary Fibrosis [facility] at [facility] [place], [place], [address]  Pager: [phone], If no answer or between  15:00h - 7:00h: call [phone] Telephone: [phone]  10:38 AM 05/13/2024

## 2024-05-14 ENCOUNTER — Ambulatory Visit: Admitting: Internal Medicine

## 2024-05-16 ENCOUNTER — Encounter: Payer: Self-pay | Admitting: Internal Medicine

## 2024-06-09 NOTE — Progress Notes (Unsigned)
 Cardiology Office Note   Date:  06/14/2024   ID:  Cynthia Padilla, Cynthia Padilla April 05, 1951, MRN [id number]  PCP:  [name], MD  Cardiologist:   [name], MD    Pt presents for f/u of CAD and HL    History of Present Illness: Cynthia Padilla is a 73 y.o. female with a history of chest pain   I saw her in 2017  Pain was atypical for coronary ischemia    Given strong FHx of CAD I recomm a calcium  score CT  2020  Ca score CT  Score 52   The aorta was noted to be 41 mm  2021  CTA of aorta was done  Aorta measured 3.8 cm    Not dilated   I saw the pt in Nov2024   AUg 2025 She was seen by JAYSON Door. At that last visit shoe said she occasionally felt like she had to take a deep breath THese spells did not occur with activity  With activity she felt good    Still doing Pilates Echo showed LVEF normal   Review diastolic function appears normal  The pt has also been seen by CHRISTELLA Cave   Rx prednisone  5days   Taking Symbicort which she says makes her cough   Set for PFTs in Jan    She denies CP   No dizziness   No palpitations    Current Outpatient Medications  Medication Sig Dispense Refill   acetaminophen  (TYLENOL ) 500 MG tablet Take 1,000 mg by mouth daily as needed for headache or moderate pain.     albuterol (VENTOLIN HFA) 108 (90 Base) MCG/ACT inhaler SMARTSIG:1-2 Puff(s) Via Inhaler Every 4-6 Hours PRN     Bempedoic Acid-Ezetimibe (NEXLIZET ) 180-10 MG TABS Take 1 tablet by mouth daily. 90 tablet 3   Coenzyme Q10 (COQ10) 150 MG CAPS Take 1 capsule by mouth as needed.     hydrochlorothiazide  (HYDRODIURIL ) 25 MG tablet Take 1 tablet (25 mg total) by mouth every evening. 90 tablet 3   meclizine  (ANTIVERT ) 25 MG tablet Take 25 mg by mouth as needed for dizziness.     meloxicam (MOBIC) 15 MG tablet Take 15 mg by mouth as needed.     Probiotic Product ([employer] PROBIOTIC) CAPS SMARTSIG:1 Capsule(s) By Mouth     SYMBICORT 80-4.5 MCG/ACT inhaler Inhale 2 puffs into the lungs 2 (two)  times daily.     No current facility-administered medications for this visit.    Allergies:   Atorvastatin , Fluconazole, Livalo  [pitavastatin ], Praluent [alirocumab], Pravastatin , Rosuvastatin , and Rosuvastatin  calcium    Past Medical History:  Diagnosis Date   Anxiety    Arthritis    thinks she has some in her spine, bursitis in hips   Asthma    had it once when she had episode of bronchitis about 10 yrs ago   Diverticulitis    DIVERTICULOSIS, COLON 11/04/2004   Qualifier: Diagnosis of  By: Orlando CMA (AAMA), [place]     GERD (gastroesophageal reflux disease)    Hepatitis    thinks she had Hepatits as a child   History of colonic polyps    Hyperlipidemia    Hypertension    Internal hemorrhoids    PONV (postoperative nausea and vomiting)    states she gets deathly sick   Vertigo     Past Surgical History:  Procedure Laterality Date   BREAST BIOPSY Right    ELBOW FRACTURE SURGERY Left    HERNIA REPAIR     pt  denies   HIP SURGERY     L hip total, May 2023   LAMINECTOMY  10/11/2011   with fusion @ L4-5   RETINAL TEAR REPAIR CRYOTHERAPY Right 2015   TUBAL LIGATION       Social History:  The patient  reports that she has never smoked. She has never used smokeless tobacco. She reports current alcohol  use of about 3.0 standard drinks of alcohol  per week. She reports that she does not use drugs.   Family History:  The patient's family history includes Colon cancer in her sister; Diabetes in her brother, father, and sister; Heart disease in her father; Leukemia in her father; Lung cancer in her mother; Ovarian cancer in her mother; Rectal cancer in her sister.    ROS:  Please see the history of present illness. All other systems are reviewed and  Negative to the above problem except as noted.    PHYSICAL EXAM: VS:  BP 132/70 (BP Location: Left Arm, Patient Position: Sitting, Cuff Size: Large)   Pulse 69   Ht 5' 7 (1.702 m)   Wt 196 lb 8 oz (89.1 kg)   SpO2 96%    BMI 30.78 kg/m   GEN: Well nourished, well developed, in no acute distress  HEENT: normal  Neck: JVP is normal  No bruits Cardiac: RRR; no murmur  Respiratory:  clear to auscultation lower fields   Some upper airway congestion  GI: soft, nontender  No hepatomegaly  Ext  No edema   EKG:  EKG not done   Echo   Sept 2025   1. Left ventricular ejection fraction, by estimation, is 60 to 65%. Left  ventricular ejection fraction by 3D volume is 60 %. The left ventricle has  normal function. The left ventricle has no regional wall motion  abnormalities. Left ventricular diastolic   parameters are consistent with Grade I diastolic dysfunction (impaired  relaxation). The average left ventricular global longitudinal strain is  -20.6 %.   2. Right ventricular systolic function is normal. The right ventricular  size is normal. Tricuspid regurgitation signal is inadequate for assessing  PA pressure.   3. The mitral valve is normal in structure. No evidence of mitral valve  regurgitation.   4. The aortic valve is tricuspid. Aortic valve regurgitation is not  visualized.   5. Aortic dilatation noted. There is mild dilatation of the ascending  aorta, measuring 43 mm.   6. The inferior vena cava is normal in size with greater than 50%  respiratory variability, suggesting right atrial pressure of 3 mmHg.   Comparison(s): No prior Echocardiogram.   Calcium  score CT  Jan 2020    1. Coronary artery calcium  score is 51.9. This calcium  score is at percentile 66 for subjects of the same age, gender and ethnicity. 2. Aortic aneurysm NOS (ICD10-I71.9). 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 Recommend annual imaging followup Lipid Panel    Component Value Date/Time   CHOL 143 05/25/2022 0942   TRIG 77 05/25/2022 0942   HDL 56 05/25/2022 0942   CHOLHDL 2.6 05/25/2022 0942   CHOLHDL 3 01/29/2014 0813   VLDL 20.2 01/29/2014 0813   LDLCALC 72 05/25/2022 0942   LDLDIRECT  125.6 03/28/2012 0922      Wt Readings from Last 3 Encounters:  06/14/24 196 lb 8 oz (89.1 kg)  05/13/24 190 lb 12.8 oz (86.5 kg)  03/20/24 191 lb 9.6 oz (86.9 kg)      ASSESSMENT AND PLAN:  1  Pulmonary   Pt with sensation of needing to take deep breath   I am not convinced cardiac in origin    Follow  Keep appts in pulmonary for follow up  2  CAD   Pt with mild plaqing on CT scan  Asymptomatic No symptoms of angina   2  HTN  BP sl increased  Follow   3  HL  Doing well on Nexliizet   LDL is 75   Follow   5  MEtabolics  A1c 6   Reviewed diet   Cut out processed carbs Pt had been on Ozempic  in past   Felt very good    I encouraged her to consider    May help with other symptoms   Defer to [place].      Signed, [name], MD

## 2024-06-14 ENCOUNTER — Ambulatory Visit: Attending: Internal Medicine | Admitting: Internal Medicine

## 2024-06-14 ENCOUNTER — Encounter: Payer: Self-pay | Admitting: Internal Medicine

## 2024-06-14 VITALS — BP 132/70 | HR 69 | Ht 67.0 in | Wt 196.5 lb

## 2024-06-14 DIAGNOSIS — I2510 Atherosclerotic heart disease of native coronary artery without angina pectoris: Secondary | ICD-10-CM | POA: Diagnosis not present

## 2024-06-14 NOTE — Patient Instructions (Signed)
 Medication Instructions:  Your physician recommends that you continue on your current medications as directed. Please refer to the Current Medication list given to you today.  *If you need a refill on your cardiac medications before your next appointment, please call your pharmacy*  Follow-Up: At [facility], you and your health needs are our priority.  As part of our continuing mission to provide you with exceptional heart care, our providers are all part of one team.  This team includes your primary Cardiologist (physician) and Advanced Practice Providers or APPs (Physician Assistants and Nurse Practitioners) who all work together to provide you with the care you need, when you need it.  Your next appointment:   1 year(s)  Provider:   [name], MD   We recommend signing up for the patient portal called MyChart.  Sign up information is provided on this After Visit Summary.  MyChart is used to connect with patients for Virtual Visits (Telemedicine).  Patients are able to view lab/test results, encounter notes, upcoming appointments, etc.  Non-urgent messages can be sent to your provider as well.    To learn more about what you can do with MyChart, go to [email or web address].

## 2024-06-17 ENCOUNTER — Telehealth: Payer: Self-pay | Admitting: Internal Medicine

## 2024-06-17 DIAGNOSIS — R053 Chronic cough: Secondary | ICD-10-CM

## 2024-06-17 DIAGNOSIS — J4540 Moderate persistent asthma, uncomplicated: Secondary | ICD-10-CM

## 2024-06-17 NOTE — Telephone Encounter (Signed)
 Patient reachd out to me over weekend wanting to be seen sooner than January. First we agreed for an appt next few weeks. Hower, this morning she texted saying still not feeling well. Wants an CXr. I have ordered a cxr but see if she can be seen by Candis Dandy or an app or one of the docs acutely this week  Prefer one of the apps Likely needs FENO  Sending to admin and clinical

## 2024-06-18 NOTE — Telephone Encounter (Signed)
 ATC x1.  Left detailed VM per DPR.  Advised to return call to schedule her with Candis Dandy PA-C at [facility] for sooner f/u per Dr. Geronimo.  When she calls back, please schedule at [facility] with Candis.  Thank you.

## 2024-06-19 ENCOUNTER — Ambulatory Visit (HOSPITAL_BASED_OUTPATIENT_CLINIC_OR_DEPARTMENT_OTHER)

## 2024-06-19 ENCOUNTER — Encounter (HOSPITAL_BASED_OUTPATIENT_CLINIC_OR_DEPARTMENT_OTHER): Payer: Self-pay

## 2024-06-19 ENCOUNTER — Other Ambulatory Visit (HOSPITAL_BASED_OUTPATIENT_CLINIC_OR_DEPARTMENT_OTHER): Payer: Self-pay

## 2024-06-19 VITALS — BP 121/71 | HR 70 | Ht 67.0 in | Wt 193.0 lb

## 2024-06-19 DIAGNOSIS — J4541 Moderate persistent asthma with (acute) exacerbation: Secondary | ICD-10-CM

## 2024-06-19 MED ORDER — BENZONATATE 200 MG PO CAPS
200.0000 mg | ORAL_CAPSULE | Freq: Three times a day (TID) | ORAL | 1 refills | Status: AC | PRN
  Filled 2024-06-19: qty 30, 10d supply, fill #0

## 2024-06-19 MED ORDER — AZITHROMYCIN 250 MG PO TABS
ORAL_TABLET | ORAL | 0 refills | Status: AC
  Filled 2024-06-19: qty 6, 5d supply, fill #0

## 2024-06-19 MED ORDER — PREDNISONE 10 MG PO TABS
ORAL_TABLET | ORAL | 0 refills | Status: AC
  Filled 2024-06-19: qty 20, 8d supply, fill #0

## 2024-06-19 MED ORDER — AEROCHAMBER MV MISC
0 refills | Status: AC
  Filled 2024-06-19: qty 1, 1d supply, fill #0

## 2024-06-19 NOTE — Progress Notes (Signed)
 @Patient  ID: Cynthia Padilla, female    DOB: 01/04/1951, 73 y.o.   MRN: [id number]  Chief Complaint  Patient presents with   Acute Visit    Referring provider: [name], MD  HPI: Discussed the use of AI scribe software for clinical note transcription with the patient, who gave verbal consent to proceed.  History of Present Illness Cynthia Padilla is a 73 year old female who presents with worsening cough and wheezing.  She has been experiencing worsening respiratory symptoms since Saturday, including a persistent cough and wheezing. Initially, she contacted Dr. Geronimo, who attempted to expedite her follow-up appointment. By Sunday night, her symptoms intensified, prompting her to seek care from Dr. Hamilton Connor PA at [facility] on Monday.  A chest x-ray performed on Monday showed no evidence of infiltrate, edema, mass, or effusion. She received a Depo Medrol  injection (125mg ) at this appointment, and had previously been on a five-day course of prednisone  in early October. She feels better post-injection but still experiences some wheezing and cough.  Her current medications include Symbicort, two puffs twice daily, and albuterol as a rescue inhaler, though she does not use the albuterol regularly. She describes her cough as productive, with yellow-green sputum, and notes wheezing upon exhalation. She also experiences significant fatigue and drainage. No sore throat, ear pain, headaches, or facial pain.  Her husband is on Fasenra for similar respiratory issues. Her symptoms tend to worsen with seasonal changes.  Last OV 05/13/2024: Cynthia Padilla 73 y.o. -returns for follow-up.  She tells me that she is worried about her symptoms.   She tells me that she feels she needs to take a deep breath just to get enough air.  This resulted in echocardiogram because she is worried about her aortic aneurysm.  The echo suggested the aortic aneurysm might be slightly more  prominent at 4.3 cm and therefore CTA has been recommended.  But the echo also showed grade 1 diastolic dysfunction.  Upon further questioning it appears that when she exerts occasionally there is wheeze.  Also for the last 6 weeks she is coughing and occasionally she has clear sputum.  She is compliant with the Symbicort.   A year ago her ehaled nitric oxide  was high as was her blood eosinophils that have steadily increased to 600 cells per cubic millimeter and her blood IgE.  However at that time we took a shared decision making to remain on inhaled corticosteroid/long-acting beta agonist.  We do not escalate to biologic therapy.  Chest x-ray at that time was more consistent with asthma.   Also of note she had texted me back in July 2025.  At that time she returned from a cruise in [place]  and she developed wheezing but then she reports she was doing okay.  I offered for her to take some prednisone  but she felt she was resolving her symptoms but currently she feels she is declined.  She is more open to taking a short course prednisone .   TEST/EVENTS :   Allergies  Allergen Reactions   Atorvastatin  Other (See Comments)    Myalgias on 10mg  daily dosing   Fluconazole Other (See Comments)   Livalo  [Pitavastatin ]     Myalgias on 1mg  daily dosing   Praluent [Alirocumab]     Elbow pain on single injection of 75mg    Pravastatin      Myalgias on 20mg  daily and every other day   Rosuvastatin      Myalgias on 2.5mg  and 5mg   dose   Rosuvastatin  Calcium      Immunization History  Administered Date(s) Administered   Fluad Trivalent(High Dose 65+) 06/21/2023   INFLUENZA, HIGH DOSE SEASONAL PF 06/07/2017   Influenza Split 05/13/2013   Influenza-Unspecified 06/08/2014, 05/11/2015, 05/08/2016, 07/08/2017, 08/08/2018   Pneumococcal Conjugate-13 04/27/2015   Pneumococcal Polysaccharide-23 08/08/2010   Pneumococcal-Unspecified 05/11/2015   Td 01/07/2003   Tdap 02/04/2014   Zoster, Live 03/30/2010,  08/08/2010    Past Medical History:  Diagnosis Date   Anxiety    Arthritis    thinks she has some in her spine, bursitis in hips   Asthma    had it once when she had episode of bronchitis about 10 yrs ago   Diverticulitis    DIVERTICULOSIS, COLON 11/04/2004   Qualifier: Diagnosis of  By: Orlando CMA (AAMA), [place]     GERD (gastroesophageal reflux disease)    Hepatitis    thinks she had Hepatits as a child   History of colonic polyps    Hyperlipidemia    Hypertension    Internal hemorrhoids    PONV (postoperative nausea and vomiting)    states she gets deathly sick   Vertigo     Tobacco History: Social History   Tobacco Use  Smoking Status Never  Smokeless Tobacco Never   Counseling given: Not Answered   Outpatient Medications Prior to Visit  Medication Sig Dispense Refill   acetaminophen  (TYLENOL ) 500 MG tablet Take 1,000 mg by mouth daily as needed for headache or moderate pain.     albuterol (VENTOLIN HFA) 108 (90 Base) MCG/ACT inhaler SMARTSIG:1-2 Puff(s) Via Inhaler Every 4-6 Hours PRN     Bempedoic Acid-Ezetimibe (NEXLIZET ) 180-10 MG TABS Take 1 tablet by mouth daily. 90 tablet 3   Coenzyme Q10 (COQ10) 150 MG CAPS Take 1 capsule by mouth as needed.     hydrochlorothiazide  (HYDRODIURIL ) 25 MG tablet Take 1 tablet (25 mg total) by mouth every evening. 90 tablet 3   meclizine  (ANTIVERT ) 25 MG tablet Take 25 mg by mouth as needed for dizziness.     meloxicam (MOBIC) 15 MG tablet Take 15 mg by mouth as needed.     Probiotic Product ([employer] PROBIOTIC) CAPS SMARTSIG:1 Capsule(s) By Mouth     SYMBICORT 80-4.5 MCG/ACT inhaler Inhale 2 puffs into the lungs 2 (two) times daily.     No facility-administered medications prior to visit.     Review of Systems: as per HPI  Constitutional:   No  weight loss, night sweats,  Fevers, chills, fatigue, or  lassitude.  HEENT:   No headaches,  Difficulty swallowing,  Tooth/dental problems, or  Sore throat,                No  sneezing, itching, ear ache, nasal congestion, post nasal drip,   CV:  No chest pain,  Orthopnea, PND, swelling in lower extremities, anasarca, dizziness, palpitations, syncope.   GI  No heartburn, indigestion, abdominal pain, nausea, vomiting, diarrhea, change in bowel habits, loss of appetite, bloody stools.   Resp: No shortness of breath with exertion or at rest.  No excess mucus, no productive cough,  No non-productive cough,  No coughing up of blood.  No change in color of mucus.  No wheezing.  No chest wall deformity  Skin: no rash or lesions.  GU: no dysuria, change in color of urine, no urgency or frequency.  No flank pain, no hematuria   MS:  No joint pain or swelling.  No decreased range of motion.  No back pain.    Physical Exam  BP 121/71   Pulse 70   Ht 5' 7 (1.702 m)   Wt 193 lb (87.5 kg)   SpO2 95%   BMI 30.23 kg/m   GEN: A/Ox3; pleasant , NAD, well nourished    HEENT:  [place]/AT,  EACs-clear, TMs-wnl, NOSE-clear, THROAT-clear, no lesions, no postnasal drip or exudate noted.   NECK:  Supple w/ fair ROM; no JVD; normal carotid impulses w/o bruits; no thyromegaly or nodules palpated; no lymphadenopathy.    RESP  Expiratory wheezes with fair aeration throughout P & A; w/o, rales/ or rhonchi. no accessory muscle use, no dullness to percussion  CARD:  RRR, no m/r/g, no peripheral edema, pulses intact, no cyanosis or clubbing.  GI:   Soft & nt; nml bowel sounds; no organomegaly or masses detected.   Musco: Warm bil, no deformities or joint swelling noted.   Neuro: alert, no focal deficits noted.    Skin: Warm, no lesions or rashes    Lab Results:  CBC    Component Value Date/Time   WBC 7.7 05/13/2024 1050   RBC 4.52 05/13/2024 1050   HGB 13.9 05/13/2024 1050   HGB 14.2 03/18/2024 0811   HCT 41.6 05/13/2024 1050   HCT 43.9 03/18/2024 0811   PLT 303.0 05/13/2024 1050   PLT 314 03/18/2024 0811   MCV 92.2 05/13/2024 1050   MCV 95 03/18/2024 0811   MCH  30.9 03/18/2024 0811   MCH 31.7 05/20/2021 1232   MCHC 33.5 05/13/2024 1050   RDW 12.7 05/13/2024 1050   RDW 11.6 (L) 03/18/2024 0811   LYMPHSABS 3.1 05/13/2024 1050   MONOABS 0.7 05/13/2024 1050   EOSABS 0.3 05/13/2024 1050   BASOSABS 0.1 05/13/2024 1050    BMET    Component Value Date/Time   NA 140 03/18/2024 0811   K 4.6 03/18/2024 0811   CL 99 03/18/2024 0811   CO2 26 03/18/2024 0811   GLUCOSE 107 (H) 03/18/2024 0811   GLUCOSE 111 (H) 05/20/2021 1232   BUN 13 03/18/2024 0811   CREATININE 0.86 03/18/2024 0811   CALCIUM  9.9 03/18/2024 0811   GFRNONAA >60 05/20/2021 1232   GFRAA 88 07/08/2019 0735    BNP No results found for: BNP  ProBNP    Component Value Date/Time   PROBNP 158 03/18/2024 0811    Imaging: No results found.  Administration History     None          Latest Ref Rng & Units 07/13/2020   10:58 AM  PFT Results  FVC-Pre L 3.47   FVC-Predicted Pre % 104   FVC-Post L 3.47   FVC-Predicted Post % 103   Pre FEV1/FVC % % 77   Post FEV1/FCV % % 82   FEV1-Pre L 2.66   FEV1-Predicted Pre % 105   FEV1-Post L 2.86   DLCO uncorrected ml/min/mmHg 26.37   DLCO UNC% % 123   DLCO corrected ml/min/mmHg 26.37   DLCO COR %Predicted % 123   DLVA Predicted % 119   TLC L 6.11   TLC % Predicted % 111   RV % Predicted % 116     Lab Results  Component Value Date   NITRICOXIDE 61 06/21/2023     Assessment & Plan:   Assessment & Plan Moderate persistent asthma with acute exacerbation Assessment and Plan Assessment & Plan Moderate persistent asthma with acute exacerbation Recent exacerbation with persistent wheezing and cough indicates ongoing inflammation. Biologic therapy considered due to  recurrent exacerbations. Insurance coverage for Symbicort ending, necessitating inhaler switch at the first of the year. Biologic therapy decision deferred until exacerbation resolves and PFT results available and per patient request. - Prescribed prednisone   taper for inflammation. - Prescribed Zithromax  for potential bacterial infection. - Prescribed cough medicine for nighttime relief. - Prescribed spacer for Symbicort inhaler; instructed on its usage and provided printed materials for review.  Readdress alternative medication at the start of the new year. - Provided information on Fasenra and other biologics. - Scheduled PFT in January. - Continue follow-up with Dr. Geronimo in January for biologic therapy discussion.  She wishes to postpone starting biologic therapy for now due to current illness, the need to evaluate the biologic choices, and due to insurance concerns that will be shifting after the first of the year.   FENO not available in office at [facility] and IgE/eos not checked today as she received Solumedrol in the last two days.     Return for as scheduled in January 2026.  Candis Dandy, PA-C 06/19/2024

## 2024-06-19 NOTE — Patient Instructions (Signed)
 Take Prednisone  and Azithromycin  until finished.  Plan for follow up as scheduled with PFT and in office visit.  Information regarding Fasenra attached.

## 2024-06-19 NOTE — Assessment & Plan Note (Signed)
 Assessment and Plan Assessment & Plan Moderate persistent asthma with acute exacerbation Recent exacerbation with persistent wheezing and cough indicates ongoing inflammation. Biologic therapy considered due to recurrent exacerbations. Insurance coverage for Symbicort ending, necessitating inhaler switch at the first of the year. Biologic therapy decision deferred until exacerbation resolves and PFT results available and per patient request. - Prescribed prednisone  taper for inflammation. - Prescribed Zithromax  for potential bacterial infection. - Prescribed cough medicine for nighttime relief. - Prescribed spacer for Symbicort inhaler; instructed on its usage and provided printed materials for review.  Readdress alternative medication at the start of the new year. - Provided information on Fasenra and other biologics. - Scheduled PFT in January. - Continue follow-up with Dr. Geronimo in January for biologic therapy discussion.  She wishes to postpone starting biologic therapy for now due to current illness, the need to evaluate the biologic choices, and due to insurance concerns that will be shifting after the first of the year.   FENO not available in office at [facility] and IgE/eos not checked today as she received Solumedrol in the last two days.

## 2024-07-19 ENCOUNTER — Other Ambulatory Visit: Payer: Self-pay | Admitting: Internal Medicine

## 2024-08-22 NOTE — Progress Notes (Unsigned)
 "      OV 05/20/2020  Subjective:  Patient ID: Cynthia Padilla, female , DOB: Jan 09, 1951 , age 74 y.o. , MRN: [id number] , ADDRESS: [address] [place] [address] PCP [name], MD Patient Care Team: [name], MD as PCP - General (Internal Medicine) Okey Vina GAILS, MD as PCP - Cardiology (Cardiology)  This Provider for this visit: Treatment Team:  Attending Provider: Geronimo Amel, MD    05/20/2020 -   Chief Complaint  Patient presents with   Consult    Pt is here due to a persistent cough she has had since 8 months. Pt also has had an occ sore throat and sinus problems also with wheezing which has subsided.     HPI Cynthia Padilla 74 y.o. -former psychologist, prison and probation services at [employer].  She is the wife of my patient Cynthia Padilla who suffers from asthma and who is on interleukin-5 receptor blockade.  I have known [name] through her visits accompanying her husband.  In this particular visit she presents as the patient herself.  She tells me that she was at baseline health up until March 2021.  She started having dry cough.  Along with this during acceleration there was some wheezing.  She saw primary care physician who gave her prednisone  Breo and Tessalon  Perles.  Then within a few weeks things resolved.  Then she came off Breo.  I believe the prednisone  initially was for 10 days.  Then again in May same thing happened.  She took another prednisone  for 10 days.  This time took Symbicort 1 puff twice daily along with albuterol.  There was some rattling and mucus.  Apparently chest x-ray was negative.  She did go to an urgent care when the chest x-ray was done.  She was given Decadron .  After that she got better and then was doing completely well till September 2021.  In the gap between May 2021 and September 2021 she is not taking any Symbicort or any inhaled steroid.  Then in September 2021 the same thing happen and around mid September she took prednisone  for  10 days.  Then by end of September same thing recurred.  This time was a little bit different started as a sore throat and then went up the sinuses Covid testing x2 was negative.  She says she has some cough wheezing nocturnal symptoms cough is mostly dry but sometimes she has mucus production.  This time she is not taking prednisone  but she is doing Symbicort.  She feels after a few weeks things are improving.  At baseline she has hypertension she is not on ACE inhibitor she is on hydrochlorothiazide .  She has acid reflux but not symptomatic anymore.  In the past she took PPI for many years and then lost weight and followed a healthy diet and got rid of acid reflux.  Her last prednisone  was in September 2021.  Of note she is concerned about the discoloration in her tongue.  She describes this is black coating.  On my exam it was gray-green.  She admitted to using cough lozenges of a similar color for the last 2 weeks and the discoloration present only for the last 1 week.  Her RSI cough score as recently experienced is as below.     Of note her last lung imaging was in January 2021 when she had CT angiogram chest for slightly enlarged aorta.  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  She follows with Dr. [name] for this.  On the lung images the mediastinum was felt to be normal and the pulmonary parenchyma is felt to be normal.  She does have a 5 mm peripheral right upper lobe nodule.  It appears that Dr. Gull reviewed the CT scan along with old ones from a previous health system.  And she has had these nodules for some years and it is stable.  [name]  She has not been a smoker.   Other issue is that she is not vaccinated herself against COVID.  She normally gets vaccines.  But apparently a few years ago teach took the shingles vaccine [it was not Shingrix but the zoster] and she had right arm swelling.  Therefore she  is can of petrified.  But recently she not had issues with flu shot or other vaccines in terms of major allergic reactions.  We discussed the overall incidence of anaphylaxis and that the risk is being 2% but the risk is lower in patientts in  patients were generated to have tolerated vaccines without any allergic reaction.  We discussed the 3 options.  Recommended anecdotally Pfizer over the other 2.  ROS - per HPI  Results for ANNDEE, CONNETT (MRN [id number]) as of 05/20/2020 15:55  Ref. Range 07/19/2011 08:32 03/28/2012 09:22 03/27/2013 08:47 01/29/2014 08:13 03/06/2014 12:13  Eosinophils Absolute Latest Ref Range: 0.0 - 0.7 K/uL 0.2 0.1 0.1 0.1 0.1   CT angiogram chest August 19, 2019 -.IMPRESSION: 1. No evidence for thoracic aortic aneurysm on this contrast enhanced study. The ascending thoracic aorta measures approximately 3.8 cm on this examination. 2. There is a 5 mm pulmonary nodule in the peripheral right upper lobe. No follow-up needed if patient is low-risk. Non-contrast chest CT can be considered in 12 months if patient is high-risk. This recommendation follows the consensus statement: Guidelines for Management of Incidental Pulmonary Nodules Detected on CT Images: From the Fleischner Society 2017; Radiology 2017; 284:228-243.     Electronically Signed   By: Lonni Seip M.D.   On: 08/19/2019 16:33   No results found for: NITRICOXIDE    OV 09/09/2020  Subjective:  Patient ID: Cynthia Padilla, female , DOB: Dec 12, 1950 , age 48 y.o. , MRN: [id number] , ADDRESS: [address] [place] [address] PCP [name], MD Patient Care Team: [name], MD as PCP - General (Internal Medicine) Gull Vina GAILS, MD as PCP - Cardiology (Cardiology)  This Provider for this visit: Treatment Team:  Attending Provider: Geronimo Amel, MD    09/09/2020 -   Chief Complaint  Patient presents with   Follow-up    Doing well     HPI [facility] 74 y.o.  -presents for asthma follow-up.  She continues to do well.  The Symbicort is really helping her but causes hoarseness of voice.  Therefore it is a difficult balance.  She is found that if she takes Symbicort at night once daily then it is fine.  Nevertheless the last few days she has some hoarseness which she believes is from Symbicort.  There is no respiratory illness.  We offered the option of switching to Pulmicort nebulizer but at this point she just wants to continue with Symbicort at night.  In terms of COVID-19: She is not yet vaccinated.  She is fearful of the side effects.  Because of this she is unable to travel to [place].  She checked an  IgG after a respiratory infection in the fall with IgG was negative.  Therefore she had to cancel her [place] trip.  She is also not able to travel to [place] because of all these restrictions.  She has asthma and age 5.  She has some risk factors for getting serious Covid.  Therefore I discussed the option about referring for monoclonal antibody Evusheld but at this point in time she wants to hold off.  She wants to talk to her travel operator [employer] about making a decision.  She is hopeful that policy restrictions on travel will lift off.  We did did discuss the option about antivirals if she were to get Covid.  She will continue to stay socially distance and mask and take appropriate precautions.      OV 06/21/2023  Subjective:  Patient ID: Cynthia Vernard Padilla, female , DOB: Dec 17, 1950 , age 24 y.o. , MRN: [id number] , ADDRESS: [address] [place] [address] PCP [name], MD Patient Care Team: [name], MD as PCP - General (Internal Medicine) Okey Vina GAILS, MD as PCP - Cardiology (Cardiology) Abran Norleen SAILOR, MD as Consulting Physician (Gastroenterology)  This Provider for this visit: Treatment Team:  Attending Provider: Geronimo Amel, MD    06/21/2023 -   Chief Complaint  Patient presents with   Follow-up     Breathing has overall been doing well. She has been wheezing some. Has been taking symbicort more frequently. She has had some PND and cough with yellow sputum.      HPI [place] 74 y.o. -returns for follow-up of her asthma.  Have not actually seen her more than 2 and half years although she is accompanied her husband who has eosinophilic asthma on visits.  Her husband himself in October 2024 had hip replacement and is doing well.  They both are looking at changing the insurance from [employer] to [employer] because [employer] has better coverage with [place].  She is currently using her Symbicort 1 puff twice daily.  She reported asthma control is good ACT score is 24.  However she does admit that occasionally she will have a wheeze.  Is not to the point that she is waking up at night it is not to the point that she is having to use albuterol for rescue.  But when she takes a deep breath and exhale she will hear the wheeze.  Infectious able to reproduce that in the office.  In auscultation there was no wheeze.  Her exhaled nitric oxide  though today was elevated at 61 ppb suggesting suboptimal control of asthma.  She is agreed to have a flu shot today.    OV 09/18/2023  Subjective:  Patient ID: Cynthia Vernard Padilla, female , DOB: [birth year] , age 2 y.o. , MRN: [id number] , ADDRESS: [address] [place] [address] PCP [name], MD Patient Care Team: [name], MD as PCP - General (Internal Medicine) Okey Vina GAILS, MD as PCP - Cardiology (Cardiology) Abran Norleen SAILOR, MD as Consulting Physician (Gastroenterology)  This Provider for this visit: Treatment Team:  Attending Provider: Geronimo Amel, MD    09/18/2023 -   Chief Complaint  Patient presents with   Follow-up    Doing well.     HPI [place] 74 y.o. -follow-up asthma associated with slightly high IgE 187 and blood eosinophils 600 cells per cubic millimeter.  Last seen mid November  2024.  At that point in time she was on Symbicort  1 puff twice daily.  She thought she was doing well but her Joycie was high and I had some wheezing.  Blood testing showed slightly elevated IgE and blood eosinophils but RAST panel was otherwise negative.  After that she has been on 2 puff 2 times daily and is here for follow-up.  She says she is doing well.  No albuterol rescue use.  Feels asthma is well-controlled.  She does not feel the need for additional biologic therapy or additional medications to control her asthma.    OV 05/13/2024  Subjective:  Patient ID: Cynthia Vernard Padilla, female , DOB: 11-06-1950 , age 37 y.o. , MRN: [id number] , AD[address]ESS: [address] [place] [address] PCP [name], MD Patient Care Team: [name], MD as PCP - General (Internal Medicine) Okey Vina GAILS, MD as PCP - Cardiology (Cardiology) Abran Norleen SAILOR, MD as Consulting Physician (Gastroenterology)  This Provider for this visit: Treatment Team:  Attending Provider: Geronimo Amel, MD    05/13/2024 -   Chief Complaint  Patient presents with   Asthma    Pt states she has struggled with SOB but states she thinks is controlled Dry cough w/ wheezing  No flu shot     HPI [facility] 74 y.o. -returns for follow-up.  She tells me that she is worried about her symptoms.   She tells me that she feels she needs to take a deep breath just to get enough air.  This resulted in echocardiogram because she is worried about her aortic aneurysm.  The echo suggested the aortic aneurysm might be slightly more prominent at 4.3 cm and therefore CTA has been recommended.  But the echo also showed grade 1 diastolic dysfunction.  Upon further questioning it appears that when she exerts occasionally there is wheeze.  Also for the last 6 weeks she is coughing and occasionally she has clear sputum.  She is compliant with the Symbicort.  A year ago her ehaled nitric oxide  was high as was her blood eosinophils  that have steadily increased to 600 cells per cubic millimeter and her blood IgE.  However at that time we took a shared decision making to remain on inhaled corticosteroid/long-acting beta agonist.  We do not escalate to biologic therapy.  Chest x-ray at that time was more consistent with asthma.  Also of note she had texted me back in July 2025.  At that time she returned from a cruise in [place]  and she developed wheezing but then she reports she was doing okay.  I offered for her to take some prednisone  but she felt she was resolving her symptoms but currently she feels she is declined.  She is more open to taking a short course prednisone .  Lab Results  Component Value Date   NITRICOXIDE 61 06/21/2023    Lab Results  Component Value Date   NITRICOXIDE 61 06/21/2023   This result suggests high (>/= 50) Type 2 (T2) airway inflammation. This supports inhaled corticosteroid responsiveness and potential eligibility for biologic therapies targeting Type 2 inflammation.    FENO 05/13/2024 is 32 and  Lab Results  Component Value Date   NITRICOXIDE 61 06/21/2023   This result suggests intermediate (25-49) Type 2 (T2) airway inflammation; clinical correlation required.    OV 06/19/24   History of Present Illness Samatha Anspach Vi is a 74 year old female who presents with worsening cough and wheezing.  She has been experiencing worsening respiratory symptoms since Saturday, including a persistent  cough and wheezing. Initially, she contacted Dr. Geronimo, who attempted to expedite her follow-up appointment. By Sunday night, her symptoms intensified, prompting her to seek care from Dr. Glendia Connor PA at [facility] on Monday.  A chest x-ray performed on Monday showed no evidence of infiltrate, edema, mass, or effusion. She received a Depo Medrol  injection (125mg ) at this appointment, and had previously been on a five-day course of prednisone  in early October. She feels better  post-injection but still experiences some wheezing and cough.  Her current medications include Symbicort, two puffs twice daily, and albuterol as a rescue inhaler, though she does not use the albuterol regularly. She describes her cough as productive, with yellow-green sputum, and notes wheezing upon exhalation. She also experiences significant fatigue and drainage. No sore throat, ear pain, headaches, or facial pain.  Her husband is on Fasenra for similar respiratory issues. Her symptoms tend to worsen with seasonal changes.  OV 08/22/2024  Subjective:  Patient ID: Cynthia Vernard Padilla, female , DOB: 1950-11-17 , age 42 y.o. , MRN: [id number] , ADDRESS: [address] [place] [address] PCP Larnell Glendia, MD Patient Care Team: Larnell Glendia, MD as PCP - General (Internal Medicine) Okey Vina GAILS, MD as PCP - Cardiology (Cardiology) Abran Norleen SAILOR, MD as Consulting Physician (Gastroenterology)  This Provider for this visit: Treatment Team:  Attending Provider: Geronimo Amel, MD    08/22/2024 -  No chief complaint on file.    HPI Dietrich Ke 74 y.o. -    CT Chest data from date: ****  - personally visualized and independently interpreted : *** - my findings are: ***   PFT     Latest Ref Rng & Units 07/13/2020   10:58 AM  PFT Results  FVC-Pre L 3.47   FVC-Predicted Pre % 104   FVC-Post L 3.47   FVC-Predicted Post % 103   Pre FEV1/FVC % % 77   Post FEV1/FCV % % 82   FEV1-Pre L 2.66   FEV1-Predicted Pre % 105   FEV1-Post L 2.86   DLCO uncorrected ml/min/mmHg 26.37   DLCO UNC% % 123   DLCO corrected ml/min/mmHg 26.37   DLCO COR %Predicted % 123   DLVA Predicted % 119   TLC L 6.11   TLC % Predicted % 111   RV % Predicted % 116        LAB RESULTS last 96 hours No results found.       has a past medical history of Anxiety, Arthritis, Asthma, Diverticulitis, DIVERTICULOSIS, COLON (11/04/2004), GERD (gastroesophageal reflux disease), Hepatitis,  History of colonic polyps, Hyperlipidemia, Hypertension, Internal hemorrhoids, PONV (postoperative nausea and vomiting), and Vertigo.   reports that she has never smoked. She has never used smokeless tobacco.  Past Surgical History:  Procedure Laterality Date   BREAST BIOPSY Right    ELBOW FRACTURE SURGERY Left    HERNIA REPAIR     pt denies   HIP SURGERY     L hip total, May 2023   LAMINECTOMY  10/11/2011   with fusion @ L4-5   RETINAL TEAR REPAIR CRYOTHERAPY Right 2015   TUBAL LIGATION      Allergies[1]  Immunization History  Administered Date(s) Administered   Fluad Trivalent(High Dose 65+) 06/21/2023   INFLUENZA, HIGH DOSE SEASONAL PF 06/07/2017   Influenza Split 05/13/2013   Influenza-Unspecified 06/08/2014, 05/11/2015, 05/08/2016, 07/08/2017, 08/08/2018   Pneumococcal Conjugate-13 04/27/2015   Pneumococcal Polysaccharide-23 08/08/2010   Pneumococcal-Unspecified 05/11/2015   Td 01/07/2003   Tdap 02/04/2014  Zoster, Live 03/30/2010, 08/08/2010    Family History  Problem Relation Age of Onset   Ovarian cancer Mother    Lung cancer Mother    Diabetes Father    Heart disease Father    Leukemia Father    Colon cancer Sister    Diabetes Sister        x 2   Rectal cancer Sister        anal   Diabetes Brother        x 3   Throat cancer Neg Hx    Kidney disease Neg Hx    Anesthesia problems Neg Hx    Liver disease Neg Hx    Breast cancer Neg Hx    Colon polyps Neg Hx    Esophageal cancer Neg Hx    Stomach cancer Neg Hx     Current Medications[2]      Objective:   There were no vitals filed for this visit.  Estimated body mass index is 30.23 kg/m as calculated from the following:   Height as of 06/19/24: 5' 7 (1.702 m).   Weight as of 06/19/24: 193 lb (87.5 kg).  @WEIGHTCHANGE @  There were no vitals filed for this visit.   Physical Exam   General: No distress. *** O2 at rest: *** Cane present: *** Sitting in wheel chair: *** Frail:  *** Obese: *** Neuro: Alert and Oriented x 3. GCS 15. Speech normal Psych: Pleasant Resp:  Barrel Chest - ***.  Wheeze - ***, Crackles - ***, No overt respiratory distress CVS: Normal heart sounds. Murmurs - *** Ext: Stigmata of Connective Tissue Disease - *** HEENT: Normal upper airway. PEERL +. No post nasal drip        Assessment/     Assessment & Plan Moderate persistent asthma with acute exacerbation    PLAN Patient Instructions  ASthma exacerbation Cough/Wheezing/Clinical asthma  - My concern with prior high feno and now intermediate feno and with symptoms that your asthma is active  Plan - check blood cbc with diff 05/13/2024 - Continue Symbicort at  2 puff twice daily with albuterol as needed - Please take prednisone  40 mg x1 day, then 30 mg x1 day, then 20 mg x1 day, then 10 mg x1 day, and then 5 mg x1 day and stop - based on course consider biologic treatmetn add on  FLu shot  Plan  - defer 05/13/2024 but get it in  a week or so    5mm Nodule RUL - seen in CT angio jan 2021 - > no change oct 2023    Plan - no followup for nodule    Followup - 3 months spirometry and dlco and 15 min visit with Dr Geronimo    FOLLOWUP    No follow-ups on file.    SIGNATURE    Dr. [name] Geronimo, M.D., F.C.C.P,  Pulmonary and Critical Care Medicine Staff Physician, [facility] Center Director - Interstitial Lung Disease  Program  Pulmonary Fibrosis [facility] at [facility] [place], [place], [address]  Pager: [phone], If no answer or between  15:00h - 7:00h: call [phone] Telephone: [phone]  6:16 PM 08/22/2024   Moderate Complexity MDM OFFICE  2021 E/M guidelines, first released in 2021, with minor revisions added in 2023 and 2024 Must meet the requirements for 2 out of 3 dimensions to qualify.    Number and complexity of problems addressed Amount and/or complexity of data reviewed  Risk of  complications and/or morbidity  One or more chronic illness with mild exacerbation, OR progression, OR  side effects of treatment  Two or more stable chronic illnesses  One undiagnosed new problem with uncertain prognosis  One acute illness with systemic symptoms   One Acute complicated injury Must meet the requirements for 1 of 3 of the categories)  Category 1: Tests and documents, historian  Any combination of 3 of the following:  Assessment requiring an independent historian  Review of prior external note(s) from each unique source  Review of results of each unique test  Ordering of each unique test    Category 2: Interpretation of tests   Independent interpretation of a test performed by another physician/other qualified health care professional (not separately reported)  Category 3: Discuss management/tests  Discussion of management or test interpretation with external physician/other qualified health care professional/appropriate source (not separately reported) Moderate risk of morbidity from additional diagnostic testing or treatment Examples only:  Prescription drug management  Decision regarding minor surgery with identfied patient or procedure risk factors  Decision regarding elective major surgery without identified patient or procedure risk factors  Diagnosis or treatment significantly limited by social determinants of health             HIGh Complexity  OFFICE   2021 E/M guidelines, first released in 2021, with minor revisions added in 2023. Must meet the requirements for 2 out of 3 dimensions to qualify.    Number and complexity of problems addressed Amount and/or complexity of data reviewed Risk of complications and/or morbidity  Severe exacerbation of chronic illness  Acute or chronic illnesses that may pose a threat to life or bodily function, e.g., multiple trauma, acute MI, pulmonary embolus, severe respiratory distress, progressive  rheumatoid arthritis, psychiatric illness with potential threat to self or others, peritonitis, acute renal failure, abrupt change in neurological status Must meet the requirements for 2 of 3 of the categories)  Category 1: Tests and documents, historian  Any combination of 3 of the following:  Assessment requiring an independent historian  Review of prior external note(s) from each unique source  Review of results of each unique test  Ordering of each unique test    Category 2: Interpretation of tests    Independent interpretation of a test performed by another physician/other qualified health care professional (not separately reported)  Category 3: Discuss management/tests  Discussion of management or test interpretation with external physician/other qualified health care professional/appropriate source (not separately reported)  HIGH risk of morbidity from additional diagnostic testing or treatment Examples only:  Drug therapy requiring intensive monitoring for toxicity  Decision for elective major surgery with identified pateint or procedure risk factors  Decision regarding hospitalization or escalation of level of care  Decision for DNR or to de-escalate care   Parenteral controlled  substances            LEGEND - Independent interpretation involves the interpretation of a test for which there is a CPT code, and an interpretation or report is customary. When a review and interpretation of a test is performed and documented by the provider, but not separately reported (billed), then this would represent an independent interpretation. This report does not need to conform to the usual standards of a complete report of the test. This does not include interpretation of tests that do not have formal reports such as a complete blood count with differential and blood cultures. Examples would include reviewing a chest radiograph and  documenting in the medical record an  interpretation, but not separately reporting (billing) the interpretation of the chest radiograph.   An appropriate source includes professionals who are not health care professionals but may be involved in the management of the patient, such as a clinical research associate, upper officer, case manager or teacher, and does not include discussion with family or informal caregivers.    - SDOH: SDOH are the conditions in the environments where people are born, live, learn, work, play, worship, and age that affect a wide range of health, functioning, and quality-of-life outcomes and risks. (e.g., housing, food insecurity, transportation, etc.). SDOH-related Z codes ranging from Z55-Z65 are the ICD-10-CM diagnosis codes used to document SDOH data Z55 - Problems related to education and literacy Z56 - Problems related to employment and unemployment Z57 - Occupational exposure to risk factors Z58 - Problems related to physical environment Z59 - Problems related to housing and economic circumstances [phone] - Problems related to social environment [phone] - Problems related to upbringing [phone] - Other problems related to primary support group, including family circumstances Z13 - Problems related to certain psychosocial circumstances Z65 - Problems related to other psychosocial circumstances    [1]  Allergies Allergen Reactions   Atorvastatin  Other (See Comments)    Myalgias on 10mg  daily dosing   Fluconazole Other (See Comments)   Livalo  [Pitavastatin ]     Myalgias on 1mg  daily dosing   Praluent [Alirocumab]     Elbow pain on single injection of 75mg    Pravastatin      Myalgias on 20mg  daily and every other day   Rosuvastatin      Myalgias on 2.5mg  and 5mg  dose   Rosuvastatin  Calcium    [2]  Current Outpatient Medications:    acetaminophen  (TYLENOL ) 500 MG tablet, Take 1,000 mg by mouth daily as needed for headache or moderate pain., Disp: , Rfl:    albuterol (VENTOLIN HFA) 108 (90 Base) MCG/ACT inhaler,  SMARTSIG:1-2 Puff(s) Via Inhaler Every 4-6 Hours PRN, Disp: , Rfl:    azithromycin  (ZITHROMAX ) 250 MG tablet, Take 2 tablets by mouth on day 1, then take 1 tablet daily on days 2-5., Disp: 6 tablet, Rfl: 0   Bempedoic Acid-Ezetimibe (NEXLIZET ) 180-10 MG TABS, Take 1 tablet by mouth daily., Disp: 90 tablet, Rfl: 3   benzonatate  (TESSALON ) 200 MG capsule, Take 1 capsule (200 mg total) by mouth 3 (three) times daily as needed for cough., Disp: 30 capsule, Rfl: 1   Coenzyme Q10 (COQ10) 150 MG CAPS, Take 1 capsule by mouth as needed., Disp: , Rfl:    hydrochlorothiazide  (HYDRODIURIL ) 25 MG tablet, TAKE 1 TABLET BY MOUTH EVERY EVENING, Disp: 90 tablet, Rfl: 3   meclizine  (ANTIVERT ) 25 MG tablet, Take 25 mg by mouth as needed for dizziness., Disp: , Rfl:    meloxicam (MOBIC) 15 MG tablet, Take 15 mg by mouth as needed., Disp: , Rfl:    Probiotic Product ([employer] PROBIOTIC) CAPS, SMARTSIG:1 Capsule(s) By Mouth, Disp: , Rfl:    Spacer/Aero-Holding Chambers (AEROCHAMBER MV) inhaler, Use as instructed, Disp: 1 each, Rfl: 0   SYMBICORT 80-4.5 MCG/ACT inhaler, Inhale 2 puffs into the lungs 2 (two) times daily., Disp: , Rfl:   "

## 2024-08-22 NOTE — Patient Instructions (Incomplete)
 ASthma exacerbation Cough/Wheezing/Clinical asthma  - My concern with prior high feno and now intermediate feno and with symptoms that your asthma is active  Plan - check blood cbc with diff 05/13/2024 - Continue Symbicort at  2 puff twice daily with albuterol as needed - Please take prednisone  40 mg x1 day, then 30 mg x1 day, then 20 mg x1 day, then 10 mg x1 day, and then 5 mg x1 day and stop - based on course consider biologic treatmetn add on  FLu shot  Plan  - defer 05/13/2024 but get it in  a week or so    5mm Nodule RUL - seen in CT angio jan 2021 - > no change oct 2023    Plan - no followup for nodule    Followup - 3 months spirometry and dlco and 15 min visit with Dr Geronimo

## 2024-08-23 ENCOUNTER — Ambulatory Visit: Admitting: Internal Medicine

## 2024-08-23 ENCOUNTER — Ambulatory Visit

## 2024-08-23 ENCOUNTER — Encounter: Payer: Self-pay | Admitting: Internal Medicine

## 2024-08-23 VITALS — BP 118/68 | HR 64 | Temp 98.5°F | Ht 67.5 in | Wt 189.0 lb

## 2024-08-23 DIAGNOSIS — R0982 Postnasal drip: Secondary | ICD-10-CM

## 2024-08-23 DIAGNOSIS — D721 Eosinophilia, unspecified: Secondary | ICD-10-CM | POA: Diagnosis not present

## 2024-08-23 DIAGNOSIS — J4541 Moderate persistent asthma with (acute) exacerbation: Secondary | ICD-10-CM | POA: Diagnosis not present

## 2024-08-23 DIAGNOSIS — J4540 Moderate persistent asthma, uncomplicated: Secondary | ICD-10-CM | POA: Diagnosis not present

## 2024-08-23 LAB — PULMONARY FUNCTION TEST
DL/VA % pred: 110 %
DL/VA: 4.43 ml/min/mmHg/L
DLCO cor % pred: 99 %
DLCO cor: 21.44 ml/min/mmHg
DLCO unc % pred: 99 %
DLCO unc: 21.44 ml/min/mmHg
FEF 25-75 Pre: 1.82 L/s
FEF2575-%Pred-Pre: 97 %
FEV1-%Pred-Pre: 96 %
FEV1-Pre: 2.37 L
FEV1FVC-%Pred-Pre: 101 %
FEV6-%Pred-Pre: 98 %
FEV6-Pre: 3.07 L
FEV6FVC-%Pred-Pre: 103 %
FVC-%Pred-Pre: 95 %
FVC-Pre: 3.12 L
Pre FEV1/FVC ratio: 76 %
Pre FEV6/FVC Ratio: 98 %

## 2024-08-23 MED ORDER — AZELASTINE HCL 0.1 % NA SOLN: 2.0000 | Freq: Two times a day (BID) | NASAL | 3 refills | Status: AC

## 2024-08-23 NOTE — Progress Notes (Signed)
 Spirometry and diffusion capacity performed today.

## 2024-08-23 NOTE — Patient Instructions (Signed)
 Spirometry and diffusion capacity performed today.

## 2024-12-19 ENCOUNTER — Ambulatory Visit: Admitting: Internal Medicine
